# Patient Record
Sex: Female | Born: 1980 | Race: White | Hispanic: No | Marital: Married | Smoking: Current every day smoker
Health system: Southern US, Community
[De-identification: ages and names within clinical notes are randomized; demographics above are authoritative.]

## PROBLEM LIST (undated history)

## (undated) DIAGNOSIS — Z803 Family history of malignant neoplasm of breast: Secondary | ICD-10-CM

## (undated) DIAGNOSIS — Z9889 Other specified postprocedural states: Secondary | ICD-10-CM

## (undated) DIAGNOSIS — Z807 Family history of other malignant neoplasms of lymphoid, hematopoietic and related tissues: Secondary | ICD-10-CM

## (undated) DIAGNOSIS — R51 Headache: Secondary | ICD-10-CM

## (undated) DIAGNOSIS — J45909 Unspecified asthma, uncomplicated: Secondary | ICD-10-CM

## (undated) DIAGNOSIS — R7303 Prediabetes: Secondary | ICD-10-CM

## (undated) DIAGNOSIS — F419 Anxiety disorder, unspecified: Secondary | ICD-10-CM

## (undated) DIAGNOSIS — N879 Dysplasia of cervix uteri, unspecified: Secondary | ICD-10-CM

## (undated) DIAGNOSIS — E7800 Pure hypercholesterolemia, unspecified: Secondary | ICD-10-CM

## (undated) DIAGNOSIS — F988 Other specified behavioral and emotional disorders with onset usually occurring in childhood and adolescence: Secondary | ICD-10-CM

## (undated) DIAGNOSIS — R519 Headache, unspecified: Secondary | ICD-10-CM

## (undated) DIAGNOSIS — R112 Nausea with vomiting, unspecified: Secondary | ICD-10-CM

## (undated) DIAGNOSIS — Z808 Family history of malignant neoplasm of other organs or systems: Secondary | ICD-10-CM

## (undated) DIAGNOSIS — G43109 Migraine with aura, not intractable, without status migrainosus: Secondary | ICD-10-CM

## (undated) HISTORY — DX: Dysplasia of cervix uteri, unspecified: N87.9

## (undated) HISTORY — DX: Family history of malignant neoplasm of breast: Z80.3

## (undated) HISTORY — DX: Family history of other malignant neoplasms of lymphoid, hematopoietic and related tissues: Z80.7

## (undated) HISTORY — DX: Family history of malignant neoplasm of other organs or systems: Z80.8

## (undated) HISTORY — DX: Anxiety disorder, unspecified: F41.9

## (undated) HISTORY — PX: TONSILLECTOMY: SUR1361

## (undated) HISTORY — DX: Migraine with aura, not intractable, without status migrainosus: G43.109

## (undated) HISTORY — DX: Pure hypercholesterolemia, unspecified: E78.00

## (undated) HISTORY — PX: CERVICAL BIOPSY  W/ LOOP ELECTRODE EXCISION: SUR135

## (undated) HISTORY — PX: ENDOMETRIAL ABLATION: SHX621

## (undated) HISTORY — PX: TUBAL LIGATION: SHX77

---

## 2005-11-02 ENCOUNTER — Other Ambulatory Visit: Admission: RE | Admit: 2005-11-02 | Discharge: 2005-11-02 | Payer: Self-pay | Admitting: Obstetrics and Gynecology

## 2005-11-27 ENCOUNTER — Emergency Department (HOSPITAL_COMMUNITY): Admission: EM | Admit: 2005-11-27 | Discharge: 2005-11-27 | Payer: Self-pay | Admitting: Family Medicine

## 2011-09-15 ENCOUNTER — Ambulatory Visit: Payer: Self-pay | Admitting: Physical Medicine and Rehabilitation

## 2013-01-27 ENCOUNTER — Observation Stay: Payer: Self-pay | Admitting: Obstetrics and Gynecology

## 2013-02-23 ENCOUNTER — Ambulatory Visit: Payer: Self-pay | Admitting: Obstetrics and Gynecology

## 2013-03-20 ENCOUNTER — Ambulatory Visit: Payer: Self-pay | Admitting: Obstetrics and Gynecology

## 2013-04-17 ENCOUNTER — Ambulatory Visit: Payer: Self-pay | Admitting: Obstetrics and Gynecology

## 2013-04-17 LAB — CBC WITH DIFFERENTIAL/PLATELET
Basophil #: 0.1 10*3/uL (ref 0.0–0.1)
Basophil %: 0.5 %
Eosinophil %: 1.2 %
HGB: 12 g/dL (ref 12.0–16.0)
Lymphocyte %: 19.8 %
MCHC: 34.8 g/dL (ref 32.0–36.0)
MCV: 83 fL (ref 80–100)
Monocyte #: 0.5 x10 3/mm (ref 0.2–0.9)
Monocyte %: 4.5 %
Neutrophil #: 8.2 10*3/uL — ABNORMAL HIGH (ref 1.4–6.5)
RBC: 4.13 10*6/uL (ref 3.80–5.20)
RDW: 13.6 % (ref 11.5–14.5)
WBC: 11.1 10*3/uL — ABNORMAL HIGH (ref 3.6–11.0)

## 2013-04-20 ENCOUNTER — Inpatient Hospital Stay: Payer: Self-pay | Admitting: Obstetrics and Gynecology

## 2013-04-22 LAB — PATHOLOGY REPORT

## 2014-06-09 DIAGNOSIS — F988 Other specified behavioral and emotional disorders with onset usually occurring in childhood and adolescence: Secondary | ICD-10-CM | POA: Insufficient documentation

## 2014-06-09 DIAGNOSIS — Z8639 Personal history of other endocrine, nutritional and metabolic disease: Secondary | ICD-10-CM | POA: Insufficient documentation

## 2014-12-10 NOTE — Op Note (Signed)
PATIENT NAME:  Haley Mcdonald, Haley Mcdonald MR#:  [id number] DATE OF BIRTH:  October 08, 1980  DATE OF PROCEDURE:  04/20/2013  PREOPERATIVE DIAGNOSES: 1.  [redacted] weeks gestation.  2.  Breech presentation.  3.  Elective permanent sterilization.  POSTOPERATIVE DIAGNOSIS:  1.  [redacted] weeks gestation.  2.  Breech presentation.  3.  Intraperitoneal intrauterine device. 4.  Elective permanent sterilization.   PROCEDURES: 1.  Primary low transverse cesarean section. 2.  Bilateral tubal ligation, [place]. 3.  Dissection of intrauterine device from the posterior serosa of the uterine wall.  4.  On-Q pump placement.   SURGEON: Boykin Nearing, M.D.   FIRST ASSISTANT: Scrub Tech Brame  INDICATIONS: This is a 35 year old gravida 4, para 2 with EDC of 04/27/2013. Fetus was noted to be in breech presentation, which was reconfirmed the day of the procedure. The patient also had elected on permanent sterilization and she reconfirms those desires the day of surgery. The patient is aware of the failure rate for tubal ligation of 1 per 200 per year.   DESCRIPTION OF PROCEDURE: After adequate spinal anesthesia, the patient was placed in the dorsal supine position with a hip roll under the right side. The patient's abdomen was prepped and draped in normal sterile fashion. A Pfannenstiel incision was made 2 fingerbreadths above the symphysis pubis. Sharp dissection was used to identify the fascia. The fascia was opened in the midline and opened in a transverse fashion. The superior aspect of the fascia was grasped with Kocher clamps and the recti muscles dissected free. The inferior aspect of the fascia was grasped with Kocher clamps and the pyramidalis muscle was dissected free. Entry into the peritoneal cavity was accomplished sharply. The vesicouterine peritoneal fold was identified and opened, and the bladder was reflected inferiorly. A low transverse uterine incision was made. Upon entry into the endometrial cavity, clear  fluid resulted. Fetal buttocks was delivered through the incision followed by delivery of the legs without difficulty. Arms were delivered without difficulty, and the head was delivered as well without difficulty. A vigorous female's oral and nasopharynx suctioned and he was passed to nursery staff who assigned Apgar scores of 9 and 9. The infant female weighed 3900 grams. The placenta was manually delivered, and the uterus was exteriorized. The endometrial cavity was wiped clean with laparotomy tape, and the cervix was opened with a ring forceps. Uterine incision was closed with 1 chromic suture in a running locking fashion with good approximation of edges. Two additional figure-of-eight sutures were required for good hemostasis. Fallopian tubes and ovaries appeared normal. During suctioning of the posterior cul-de-sac, it was noted that a lost IUD was embedded into the posterior aspect of the uterine serosa. This was dissected free with the Bovie without difficulty and good hemostasis noted. The posterior cul-de-sac was irrigated and suctioned and attention was then directed to the patient's right fallopian tube which was grasped in the midportion with a Babcock clamp. Two separate 0 plain gut sutures were applied, and a 1.5 cm portion of fallopian tube was removed. Good hemostasis was noted. A similar procedure was repeated on the patient's left fallopian tube. After grasping the tube at the midportion, 2 separate 0 plain gut sutures were applied and a 1.5 cm portion of fallopian tube removed. Good hemostasis noted. The uterus was placed back into the intra-abdominal cavity. Paracolic gutters were then wiped clean with laparotomy tape, and the tubal ligation sites appeared hemostatic, and the uterine incision appeared hemostatic. Interceed was placed over the uterine  incision, in a T-shaped fashion. The superior aspect of the fascia was regrasped with Kocher clamps. On-Q pump needles were placed from an  infraumbilical position to a subfascial position without difficulty. The fascia was then closed over top the catheters with 0 Vicryl suture in a running nonlocking fashion. Subcutaneous tissues were irrigated and bovied for hemostasis, and the skin was reapproximated with Insorb staples with good cosmetic effect, good hemostasis was noted. The On-Q pump catheters were secured at the skin level with Dermabond and the catheters were secured at the skin level and Tegaderm placed over top of these. Each catheter was loaded with 5 mL of 0.5% Marcaine. Estimated blood loss 400 mL. Intraoperative fluids 1200 mL. The patient tolerated the procedure well. The patient did receive 2 grams IV Ancef prior to commencement of the case.  ____________________________ Boykin Nearing, MD tjs:sb D: 04/20/2013 10:48:08 ET T: 04/20/2013 13:05:57 ET JOB#: [id number]  cc: Boykin Nearing, MD, <Dictator> Boykin Nearing MD ELECTRONICALLY SIGNED 04/23/2013 9:36

## 2015-03-14 ENCOUNTER — Other Ambulatory Visit: Payer: Self-pay

## 2015-03-22 ENCOUNTER — Encounter
Admission: RE | Admit: 2015-03-22 | Discharge: 2015-03-22 | Disposition: A | Discharge status: Home / Self Care | Source: Ambulatory Visit | Attending: Obstetrics and Gynecology | Admitting: Obstetrics and Gynecology

## 2015-03-22 DIAGNOSIS — Z01812 Encounter for preprocedural laboratory examination: Secondary | ICD-10-CM | POA: Diagnosis not present

## 2015-03-22 DIAGNOSIS — N939 Abnormal uterine and vaginal bleeding, unspecified: Secondary | ICD-10-CM | POA: Insufficient documentation

## 2015-03-22 HISTORY — DX: Headache, unspecified: R51.9

## 2015-03-22 HISTORY — DX: Other specified behavioral and emotional disorders with onset usually occurring in childhood and adolescence: F98.8

## 2015-03-22 HISTORY — DX: Headache: R51

## 2015-03-22 HISTORY — DX: Unspecified asthma, uncomplicated: J45.909

## 2015-03-22 LAB — BASIC METABOLIC PANEL
ANION GAP: 7 (ref 5–15)
BUN: 13 mg/dL (ref 6–20)
CALCIUM: 9.2 mg/dL (ref 8.9–10.3)
CO2: 28 mmol/L (ref 22–32)
Chloride: 104 mmol/L (ref 101–111)
Creatinine, Ser: 0.63 mg/dL (ref 0.44–1.00)
GFR calc Af Amer: >60 mL/min (ref >60)
GFR calc non Af Amer: >60 mL/min (ref >60)
Glucose, Bld: 90 mg/dL (ref 65–99)
POTASSIUM: 3.9 mmol/L (ref 3.5–5.1)
Sodium: 139 mmol/L (ref 135–145)

## 2015-03-22 LAB — CBC
HCT: 44.1 % (ref 35.0–47.0)
Hemoglobin: 14.7 g/dL (ref 12.0–16.0)
MCH: 28.5 pg (ref 26.0–34.0)
MCHC: 33.2 g/dL (ref 32.0–36.0)
MCV: 85.7 fL (ref 80.0–100.0)
Platelets: 192 10*3/uL (ref 150–440)
RBC: 5.15 MIL/uL (ref 3.80–5.20)
RDW: 13.5 % (ref 11.5–14.5)
WBC: 9.4 10*3/uL (ref 3.6–11.0)

## 2015-03-22 LAB — TYPE AND SCREEN
ABO/RH(D): O NEG
Antibody Screen: NEGATIVE

## 2015-03-22 LAB — ABO/RH: ABO/RH(D): O NEG

## 2015-03-22 NOTE — Patient Instructions (Signed)
  Your procedure is scheduled GY:IRSWNI 12, 2016 (Friday) Report to Day Surgery. To find out your arrival time please call [phone] between 1PM - 3PM on March 31, 2015 (Thursday).  Remember: Instructions that are not followed completely may result in serious medical risk, up to and including death, or upon the discretion of your surgeon and anesthesiologist your surgery may need to be rescheduled.    __x__ 1. Do not eat food or drink liquids after midnight. No gum chewing or hard candies.     ____ 2. No Alcohol for 24 hours before or after surgery.   ____ 3. Bring all medications with you on the day of surgery if instructed.    __x__ 4. Notify your doctor if there is any change in your medical condition     (cold, fever, infections).     Do not wear jewelry, make-up, hairpins, clips or nail polish.  Do not wear lotions, powders, or perfumes. You may wear deodorant.  Do not shave 48 hours prior to surgery. Men may shave face and neck.  Do not bring valuables to the hospital.    [facility] is not responsible for any belongings or valuables.               Contacts, dentures or bridgework may not be worn into surgery.  Leave your suitcase in the car. After surgery it may be brought to your room.  For patients admitted to the hospital, discharge time is determined by your                treatment team.   Patients discharged the day of surgery will not be allowed to drive home.   Please read over the following fact sheets that you were given:      ____ Take these medicines the morning of surgery with A SIP OF WATER:    1.   2.   3.   4.  5.  6.  ____ Fleet Enema (as directed)   ____ Use CHG Soap as directed  ____ Use inhalers on the day of surgery  ____ Stop metformin 2 days prior to surgery    ____ Take 1/2 of usual insulin dose the night before surgery and none on the morning of surgery.   ____ Stop Coumadin/Plavix/aspirin on   ____ Stop Anti-inflammatories on     ____ Stop supplements until after surgery.    ____ Bring C-Pap to the hospital.

## 2015-03-22 NOTE — H&P (Signed)
Haley Mcdonald is an 34 y.o. female. [id number] With abnormal uterine bleeding who desires endometrial ablation. She is not a candidate for hormonal control because of risk factors.  The patient underwent a preliminary transvaginal pelvic sonogram. - Uterus: 8.4x6.3x5cm with ES of 6/28mm - Ovaries: L volume 16, R volume 8 - Other: Nothing else unusual  Prior Pap smear normal 2014 - Hx of LEEP in remote past Endometrial biopsy benign SIS: Sounds to 7cm total length - no fibroids or polyps Contraception: BTL    Past Medical History  Diagnosis Date  . Asthma     as a child  . Headache   . Cancer     cervical  . ADD (attention deficit disorder) without hyperactivity     Past Surgical History  Procedure Laterality Date  . Tonsillectomy    . Cesarean section      Family History  Problem Relation Age of Onset  . Breast cancer Mother   . Lung cancer Father     Social History:  reports that she has been smoking Cigarettes.  She has been smoking about 0.50 packs per day. She has never used smokeless tobacco. She reports that she does not drink alcohol or use illicit drugs.  Allergies: No Known Allergies  No prescriptions prior to admission    Review of Systems  Constitutional: Negative for fever and chills.  Eyes: Negative for blurred vision and double vision.  Respiratory: Negative for shortness of breath.   Cardiovascular: Negative for chest pain and palpitations.  Gastrointestinal: Negative for nausea, vomiting, abdominal pain, diarrhea and constipation.  Genitourinary: Negative for dysuria, urgency, frequency and flank pain.  Neurological: Negative for headaches.  Psychiatric/Behavioral: Negative for depression.    Physical Exam  Constitutional: She is oriented to person, place, and time. She appears well-developed and well-nourished. No distress.  Eyes: No scleral icterus.  Neck: Normal range of motion. Neck supple.  Cardiovascular: Normal rate.   Respiratory:  Effort normal. No respiratory distress.  GI: Soft. She exhibits no distension. There is no tenderness.  Genitourinary: Vagina normal and uterus normal.  Musculoskeletal: Normal range of motion.  Neurological: She is alert and oriented to person, place, and time.  Skin: Skin is warm and dry.  Psychiatric: She has a normal mood and affect.   Assessment/Plan:  Plan for [facility] hysteroscopy and Novasure ablation.  Originally planned for Novasure ablation in the office, but as her insurance will not cover office procedures, will consent for OR visit. Full options discussed- declines Depo, Mirena IUD with uterine perforation in the past, not a candidate for Combined therapy because of smoking hx. She is aware of possible failure before menopause and need for hysterectomy at that time or progesterone only methods. She also aware of small risk of tubal-ablation syndrome. She desires to proceed with above procedure.  [name] 03/22/2015, 8:37 AM

## 2015-03-24 ENCOUNTER — Other Ambulatory Visit: Payer: Self-pay

## 2015-04-01 ENCOUNTER — Ambulatory Visit: Admitting: Anesthesiology

## 2015-04-01 ENCOUNTER — Encounter: Payer: Self-pay | Admitting: *Deleted

## 2015-04-01 ENCOUNTER — Ambulatory Visit
Admission: RE | Admit: 2015-04-01 | Discharge: 2015-04-01 | Disposition: A | Discharge status: Home / Self Care | Source: Ambulatory Visit | Attending: Obstetrics and Gynecology | Admitting: Obstetrics and Gynecology

## 2015-04-01 ENCOUNTER — Encounter
Admission: RE | Disposition: A | Discharge status: Home / Self Care | Payer: Self-pay | Source: Ambulatory Visit | Attending: Obstetrics and Gynecology

## 2015-04-01 DIAGNOSIS — N920 Excessive and frequent menstruation with regular cycle: Secondary | ICD-10-CM | POA: Insufficient documentation

## 2015-04-01 DIAGNOSIS — F17210 Nicotine dependence, cigarettes, uncomplicated: Secondary | ICD-10-CM | POA: Diagnosis not present

## 2015-04-01 HISTORY — PX: DILITATION & CURRETTAGE/HYSTROSCOPY WITH NOVASURE ABLATION: SHX5568

## 2015-04-01 LAB — POCT PREGNANCY, URINE: PREG TEST UR: NEGATIVE

## 2015-04-01 LAB — TYPE AND SCREEN
ABO/RH(D): O NEG
Antibody Screen: NEGATIVE

## 2015-04-01 SURGERY — DILATATION & CURETTAGE/HYSTEROSCOPY WITH NOVASURE ABLATION
Anesthesia: Monitor Anesthesia Care | Site: Cervix | Wound class: Clean Contaminated

## 2015-04-01 MED ORDER — OXYCODONE HCL 5 MG/5ML PO SOLN: 5.0000 mg | Freq: Once | ORAL | Status: DC | PRN

## 2015-04-01 MED ORDER — IBUPROFEN 800 MG PO TABS
800.0000 mg | ORAL_TABLET | Freq: Three times a day (TID) | ORAL | Status: DC | PRN
Start: 2015-04-01 — End: 2018-06-02

## 2015-04-01 MED ORDER — BUPIVACAINE-EPINEPHRINE (PF) 0.5% -1:200000 IJ SOLN
INTRAMUSCULAR | Status: DC | PRN
  Administered 2015-04-01: 20 mL via PERINEURAL

## 2015-04-01 MED ORDER — SILVER NITRATE-POT NITRATE 75-25 % EX MISC
CUTANEOUS | Status: AC
  Filled 2015-04-01: qty 4

## 2015-04-01 MED ORDER — DEXAMETHASONE SODIUM PHOSPHATE 10 MG/ML IJ SOLN
INTRAMUSCULAR | Status: DC | PRN
  Administered 2015-04-01: 10 mg via INTRAVENOUS

## 2015-04-01 MED ORDER — LACTATED RINGERS IV SOLN
INTRAVENOUS | Status: DC
  Administered 2015-04-01 (×2): via INTRAVENOUS

## 2015-04-01 MED ORDER — FAMOTIDINE 20 MG PO TABS
ORAL_TABLET | ORAL | Status: AC
  Administered 2015-04-01: 20 mg via ORAL
  Filled 2015-04-01: qty 1

## 2015-04-01 MED ORDER — ONDANSETRON HCL 4 MG/2ML IJ SOLN
INTRAMUSCULAR | Status: DC | PRN
  Administered 2015-04-01: 4 mg via INTRAVENOUS

## 2015-04-01 MED ORDER — PROPOFOL 10 MG/ML IV BOLUS
INTRAVENOUS | Status: DC | PRN
  Administered 2015-04-01: 50 mg via INTRAVENOUS
  Administered 2015-04-01: 130 mg via INTRAVENOUS
  Administered 2015-04-01: 20 mg via INTRAVENOUS
  Administered 2015-04-01: 50 mg via INTRAVENOUS

## 2015-04-01 MED ORDER — FENTANYL CITRATE (PF) 100 MCG/2ML IJ SOLN: 25.0000 ug | INTRAMUSCULAR | Status: DC | PRN

## 2015-04-01 MED ORDER — SCOPOLAMINE 1 MG/3DAYS TD PT72
MEDICATED_PATCH | TRANSDERMAL | Status: AC
  Administered 2015-04-01: 1.5 mg via TRANSDERMAL
  Filled 2015-04-01: qty 1

## 2015-04-01 MED ORDER — FAMOTIDINE 20 MG PO TABS
20.0000 mg | ORAL_TABLET | Freq: Once | ORAL | Status: AC
  Administered 2015-04-01: 20 mg via ORAL

## 2015-04-01 MED ORDER — ACETAMINOPHEN 10 MG/ML IV SOLN
INTRAVENOUS | Status: AC
  Filled 2015-04-01: qty 100

## 2015-04-01 MED ORDER — OXYCODONE-ACETAMINOPHEN 5-325 MG PO TABS: 1.0000 | ORAL_TABLET | Freq: Four times a day (QID) | ORAL | Status: DC | PRN

## 2015-04-01 MED ORDER — KETOROLAC TROMETHAMINE 30 MG/ML IJ SOLN
INTRAMUSCULAR | Status: DC | PRN
Start: 2015-04-01 — End: 2015-04-01
  Administered 2015-04-01: 30 mg via INTRAVENOUS

## 2015-04-01 MED ORDER — SCOPOLAMINE 1 MG/3DAYS TD PT72
1.0000 | MEDICATED_PATCH | Freq: Once | TRANSDERMAL | Status: DC
  Administered 2015-04-01: 1.5 mg via TRANSDERMAL

## 2015-04-01 MED ORDER — OXYCODONE HCL 5 MG PO TABS: 5.0000 mg | ORAL_TABLET | Freq: Once | ORAL | Status: DC | PRN

## 2015-04-01 MED ORDER — MIDAZOLAM HCL 2 MG/2ML IJ SOLN
INTRAMUSCULAR | Status: DC | PRN
Start: 2015-04-01 — End: 2015-04-01
  Administered 2015-04-01: 2 mg via INTRAVENOUS

## 2015-04-01 MED ORDER — PROPOFOL INFUSION 10 MG/ML OPTIME
INTRAVENOUS | Status: DC | PRN
  Administered 2015-04-01: 150 ug/kg/min via INTRAVENOUS

## 2015-04-01 MED ORDER — BUPIVACAINE-EPINEPHRINE (PF) 0.5% -1:200000 IJ SOLN
INTRAMUSCULAR | Status: AC
  Filled 2015-04-01: qty 30

## 2015-04-01 MED ORDER — PROMETHAZINE HCL 25 MG/ML IJ SOLN
6.2500 mg | INTRAMUSCULAR | Status: DC | PRN
Start: 2015-04-01 — End: 2015-04-01

## 2015-04-01 MED ORDER — LACTATED RINGERS IV SOLN: INTRAVENOUS | Status: DC

## 2015-04-01 MED ORDER — ACETAMINOPHEN 10 MG/ML IV SOLN
INTRAVENOUS | Status: DC | PRN
  Administered 2015-04-01: 1000 mg via INTRAVENOUS

## 2015-04-01 SURGICAL SUPPLY — 16 items
CANISTER SUCT 1200ML W/VALVE (MISCELLANEOUS) ×2 IMPLANT
CATH ROBINSON RED A/P 16FR (CATHETERS) ×2 IMPLANT
GLOVE BIO SURGEON STRL SZ 6 (GLOVE) ×2 IMPLANT
GLOVE BIO SURGEON STRL SZ 6.5 (GLOVE) ×2 IMPLANT
GLOVE INDICATOR 6.5 STRL GRN (GLOVE) ×2 IMPLANT
GOWN STRL REUS W/ TWL LRG LVL3 (GOWN DISPOSABLE) ×2 IMPLANT
GOWN STRL REUS W/TWL LRG LVL3 (GOWN DISPOSABLE) ×2
IV LACTATED RINGERS 1000ML (IV SOLUTION) ×2 IMPLANT
KIT RM TURNOVER CYSTO AR (KITS) ×2 IMPLANT
NOVASURE ENDOMETRIAL ABLATION (MISCELLANEOUS) ×2 IMPLANT
NS IRRIG 500ML POUR BTL (IV SOLUTION) ×2 IMPLANT
PACK DNC HYST (MISCELLANEOUS) ×2 IMPLANT
PAD OB MATERNITY 4.3X12.25 (PERSONAL CARE ITEMS) ×2 IMPLANT
PAD PREP 24X41 OB/GYN DISP (PERSONAL CARE ITEMS) ×2 IMPLANT
TOWEL OR 17X26 4PK STRL BLUE (TOWEL DISPOSABLE) ×2 IMPLANT
TUBING CONNECTING 10 (TUBING) ×2 IMPLANT

## 2015-04-01 NOTE — Transfer of Care (Signed)
Immediate Anesthesia Transfer of Care Note  Patient: Haley Mcdonald  Procedure(s) Performed: Procedure(s): DILATATION & CURETTAGE/HYSTEROSCOPY WITH NOVASURE ABLATION (N/A)  Patient Location: PACU  Anesthesia Type:General  Level of Consciousness: awake  Airway & Oxygen Therapy: Patient Spontanous Breathing and Patient connected to face mask oxygen  Post-op Assessment: Report given to RN and Post -op Vital signs reviewed and stable  Post vital signs: Reviewed and stable  Last Vitals:  Filed Vitals:   04/01/15 0838  BP: 96/60  Pulse: 71  Temp: 36.6 C  Resp: 17    Complications: No apparent anesthesia complications

## 2015-04-01 NOTE — Progress Notes (Signed)
Pt awakened, oral airway removed intact 

## 2015-04-01 NOTE — Interval H&P Note (Signed)
History and Physical Interval Note:  04/01/2015 7:34 AM  Haley Mcdonald  has presented today for surgery, with the diagnosis of ABNORMAL UTERINE BLEEDING  The various methods of treatment have been discussed with the patient and family. After consideration of risks, benefits and other options for treatment, the patient has consented to  Procedure(s): [place] (N/A) as a surgical intervention .  The patient's history has been reviewed, patient examined, no change in status, stable for surgery.  I have reviewed the patient's chart and labs.  Questions were answered to the patient's satisfaction.     [name]

## 2015-04-01 NOTE — Anesthesia Preprocedure Evaluation (Signed)
Anesthesia Evaluation  Patient identified by MRN, date of birth, ID band Patient awake    Reviewed: Allergy & Precautions, H&P , NPO status , Patient's Chart, lab work & pertinent test results  History of Anesthesia Complications (+) PONV and history of anesthetic complications  Airway Mallampati: III  TM Distance: <3 FB Neck ROM: full    Dental  (+) Poor Dentition   Pulmonary neg shortness of breath, asthma , Current Smoker,  breath sounds clear to auscultation  Pulmonary exam normal       Cardiovascular Exercise Tolerance: Good - Past MI negative cardio ROS Normal cardiovascular examRhythm:regular Rate:Normal     Neuro/Psych  Headaches, PSYCHIATRIC DISORDERS    GI/Hepatic negative GI ROS, Neg liver ROS,   Endo/Other  negative endocrine ROS  Renal/GU negative Renal ROS  negative genitourinary   Musculoskeletal   Abdominal   Peds  Hematology negative hematology ROS (+)   Anesthesia Other Findings Past Medical History:   Asthma                                                         Comment:as a child   Headache                                                     Cancer                                                         Comment:cervical   ADD (attention deficit disorder) without hyper*              Reproductive/Obstetrics negative OB ROS                             Anesthesia Physical Anesthesia Plan  ASA: II  Anesthesia Plan: MAC   Post-op Pain Management:    Induction:   Airway Management Planned:   Additional Equipment:   Intra-op Plan:   Post-operative Plan:   Informed Consent: I have reviewed the patients History and Physical, chart, labs and discussed the procedure including the risks, benefits and alternatives for the proposed anesthesia with the patient or authorized representative who has indicated his/her understanding and acceptance.   Dental Advisory  Given  Plan Discussed with: Anesthesiologist, CRNA and Surgeon  Anesthesia Plan Comments: (MAC with backup GA with opioid avoidance )        Anesthesia Quick Evaluation

## 2015-04-01 NOTE — Progress Notes (Signed)
Pt to PACU from OR with oral airway intact.

## 2015-04-01 NOTE — Op Note (Signed)
Operative Report Hysteroscopy with Dilation and Curettage; Novasure ablation   Indications: Menorrhagia   Pre-operative Diagnosis:  1. Abnormal uterine bleeding 2. Failed medical management 3. Significant postop nausea history   Post-operative Diagnosis: same.  Procedure: 1. Exam under anesthesia 2. Fractional D&C with endocervical curettage  3. Hysteroscopy 4. Novasure endometrial ablation  Surgeon: Angelina Pih, MD  Assistant(s):  None  Anesthesia: Monitored Local Anesthesia with Sedation  Estimated Blood Loss:  Minimal         Intraoperative medications: Cervical block: 32ml of 0.5% Marcaine         Total IV Fluids: 1069ml  Urine Output: 20 ml         Specimens: Endocervical curettings, endometrial curettings         Complications:  None; patient tolerated the procedure well.         Disposition: PACU - hemodynamically stable.         Condition: stable  Findings: Uterus measuring 8.5 cm by sound; normal cervix, vagina, perineum. Cervical length: 4 cm Uterine cavity length: 4.5 cm Uterine cavity width: 4.4 cm Power in watts: 109 Total time: 1:3min  Indication for procedure/Consents: 34 y.o.  [id number] here for scheduled surgery for the aforementioned diagnoses. She has failed medical management: she is a smoker with elevated blood pressure and asthma, she experienced a uterine perforation with Mirena IUD, she declines Depo with a family history of weight gain. Risks of surgery were discussed with the patient including but not limited to: bleeding which may require transfusion; infection which may require antibiotics; injury to uterus or surrounding organs; intrauterine scarring which may impair future fertility; need for additional procedures including laparotomy or laparoscopy; and other postoperative/anesthesia complications. She is aware that she may need additional treatment for AUB prior to menopause. Written informed consent was obtained.    Procedure  Details:   The patient was taken to the operating room where iv anesthesia was administered and was found to be adequate. After a formal and adequate timeout was performed, she was placed in the dorsal lithotomy position and examined with the above findings. She was then prepped and draped in the sterile manner. Her bladder was catheterized for an estimated amount of dark amber urine. A weighed speculum was then placed in the patient's vagina and a single tooth tenaculum was applied to the anterior lip of the cervix.  A paracervical block was placed. An endocervical currettage was performed. Her cervix was serially dilated to 15 [place] using Hanks dilators.The hysteroscope was introduced to reveal the above findings.The hysteroscope was also used to determine the level of the internal os, and measurements were confirmed. The uterine cavity was carefully examined, both ostia were recognized, and diffusely proliferative endometrium with polypoid fragments was noted.  A sharp curettage was then performed until there was a gritty texture in all four quadrants.   NOVASURE PROCEDURE DETAILS:   The cervix was further dilated to accommodate the NovaSure device.  The NovaSure device was inserted, and the cavity width was determined. Using a power of 109 watts, for 1:55 sec, the endometrial ablation was performed. The hysteroscope was not re-introduced into the uterine cavity, to decrease the risk of pelvic infection. The tenaculum was removed from the anterior lip of the cervix, and the vaginal speculum was removed after noting good hemostasis.  She received iv acetaminophen and Toradol prior to leaving the OR. The patient tolerated the procedure well and was taken to the recovery area awake, extubated and in stable condition.  The patient will be discharged to home as per PACU criteria.  Routine postoperative instructions given.  She was prescribed Percocet, Ibuprofen and Colace.  She will follow up in the  clinic in two weeks for postoperative evaluation.

## 2015-04-01 NOTE — Discharge Instructions (Signed)
Discharge instructions after a hysteroscopy with dilation and curettage  Signs and Symptoms to Report  Call our office at [phone] if you have any of the following:   . Fever over 100.4 degrees or higher . Severe stomach pain not relieved with pain medications . Bright red bleeding that's heavier than a period that does not slow with rest after the first 24 hours . To go the bathroom a lot (frequency), you can't hold your urine (urgency), or it hurts when you empty your bladder (urinate) . Chest pain . Shortness of breath . Pain in the calves of your legs . Severe nausea and vomiting not relieved with anti-nausea medications . Any concerns  What You Can Expect after Surgery . You may see some pink tinged, bloody fluid. This is normal. You may also have cramping for several days.   Activities after Your Discharge Follow these guidelines to help speed your recovery at home: . Don't drive if you are in pain or taking narcotic pain medicine. You may drive when you can safely slam on the brakes, turn the wheel forcefully, and rotate your torso comfortably. This is typically 4-7 days. Practice in a parking lot or side street prior to attempting to drive regularly.  . Ask others to help with household chores for 4 weeks. . Don't do strenuous activities, exercises, or sports like vacuuming, tennis, squash, etc. until your doctor says it is safe to do so. . Walk as you feel able. Rest often since it may take a week or two for your energy level to return to normal.  . You may climb stairs . Avoid constipation:   -Eat fruits, vegetables, and whole grains. Eat small meals as your appetite will take time to return to normal.   -Drink 6 to 8 glasses of water each day unless your doctor has told you to limit your fluids.   -Use a laxative or stool softener as needed if constipation becomes a problem. You may take Miralax, metamucil, Citrucil, Colace, Senekot, FiberCon, etc. If this does not  relieve the constipation, try two tablespoons of Milk Of Magnesia every 8 hours until your bowels move.  . You may shower.  . Do not get in a hot tub, swimming pool, etc. until your doctor agrees. . Do not douche, use tampons, or have sex until your doctor says it is okay, usually about 2 weeks. . Take your pain medicine when you need it. The medicine may not work as well if the pain is bad.  Take the medicines you were taking before surgery. Other medications you might need are pain medications (ibuprofen), medications for constipation (Colace) and nausea medications (Zofran).        AMBULATORY SURGERY  DISCHARGE INSTRUCTIONS   1) The drugs that you were given will stay in your system until tomorrow so for the next 24 hours you should not:  A) Drive an automobile B) Make any legal decisions C) Drink any alcoholic beverage   2) You may resume regular meals tomorrow.  Today it is better to start with liquids and gradually work up to solid foods.  You may eat anything you prefer, but it is better to start with liquids, then soup and crackers, and gradually work up to solid foods.   3) Please notify your doctor immediately if you have any unusual bleeding, trouble breathing, redness and pain at the surgery site, drainage, fever, or pain not relieved by medication.    4) Additional Instructions:          Please contact your physician with any problems or Same Day Surgery at [phone], Monday through Friday 6 am to 4 pm, or [place] [facility] [place] Main number at [phone]. 

## 2015-04-01 NOTE — Anesthesia Postprocedure Evaluation (Signed)
  Anesthesia Post-op Note  Patient: Haley Mcdonald  Procedure(s) Performed: Procedure(s): DILATATION & CURETTAGE/HYSTEROSCOPY WITH NOVASURE ABLATION (N/A)  Anesthesia type:MAC  Patient location: PACU  Post pain: Pain level controlled  Post assessment: Post-op Vital signs reviewed, Patient's Cardiovascular Status Stable, Respiratory Function Stable, Patent Airway and No signs of Nausea or vomiting  Post vital signs: Reviewed and stable  Last Vitals:  Filed Vitals:   04/01/15 1005  BP: 106/58  Pulse:   Temp:   Resp: 16    Level of consciousness: awake, alert  and patient cooperative  Complications: No apparent anesthesia complications

## 2015-04-04 LAB — SURGICAL PATHOLOGY

## 2015-12-06 ENCOUNTER — Other Ambulatory Visit: Payer: Self-pay | Admitting: Obstetrics and Gynecology

## 2015-12-06 DIAGNOSIS — N644 Mastodynia: Secondary | ICD-10-CM

## 2015-12-07 ENCOUNTER — Ambulatory Visit
Admission: RE | Admit: 2015-12-07 | Discharge: 2015-12-07 | Disposition: A | Discharge status: Home / Self Care | Source: Ambulatory Visit | Attending: Obstetrics and Gynecology | Admitting: Obstetrics and Gynecology

## 2015-12-07 DIAGNOSIS — N644 Mastodynia: Secondary | ICD-10-CM | POA: Insufficient documentation

## 2015-12-07 DIAGNOSIS — N63 Unspecified lump in breast: Secondary | ICD-10-CM | POA: Insufficient documentation

## 2016-01-10 ENCOUNTER — Inpatient Hospital Stay: Admitting: Oncology

## 2017-11-29 ENCOUNTER — Other Ambulatory Visit: Payer: Self-pay | Admitting: Family Medicine

## 2017-11-29 MED ORDER — AMOXICILLIN 875 MG PO TABS: 875.0000 mg | ORAL_TABLET | Freq: Two times a day (BID) | ORAL | 0 refills | Status: DC

## 2017-12-16 ENCOUNTER — Ambulatory Visit (INDEPENDENT_AMBULATORY_CARE_PROVIDER_SITE_OTHER): Admitting: Family Medicine

## 2017-12-16 ENCOUNTER — Encounter: Payer: Self-pay | Admitting: Family Medicine

## 2017-12-16 ENCOUNTER — Other Ambulatory Visit: Payer: Self-pay | Admitting: Surgical

## 2017-12-16 VITALS — BP 112/82 | HR 64 | Temp 97.8°F

## 2017-12-16 DIAGNOSIS — F411 Generalized anxiety disorder: Secondary | ICD-10-CM

## 2017-12-16 DIAGNOSIS — F902 Attention-deficit hyperactivity disorder, combined type: Secondary | ICD-10-CM | POA: Diagnosis not present

## 2017-12-16 DIAGNOSIS — R5383 Other fatigue: Secondary | ICD-10-CM | POA: Diagnosis not present

## 2017-12-16 DIAGNOSIS — R5381 Other malaise: Secondary | ICD-10-CM | POA: Diagnosis not present

## 2017-12-16 DIAGNOSIS — E559 Vitamin D deficiency, unspecified: Secondary | ICD-10-CM | POA: Diagnosis not present

## 2017-12-16 MED ORDER — LEVOFLOXACIN 500 MG PO TABS: 500.0000 mg | ORAL_TABLET | Freq: Every day | ORAL | 0 refills | Status: DC

## 2017-12-16 MED ORDER — ESCITALOPRAM OXALATE 10 MG PO TABS: 10.0000 mg | ORAL_TABLET | Freq: Every day | ORAL | 1 refills | Status: DC

## 2017-12-16 NOTE — Progress Notes (Signed)
Haley Mcdonald is a 37 y.o. female is here TO ESTABLISH CARE.  History of Present Illness:   HPI: ADHD medications help but still very tired. Working around the clock. Lack of self-care. Racing thoughts at night. Enjoying time with family. Stressed that husband may be deployed.   Health Maintenance Due  Topic Date Due  . HIV Screening  04/16/1996  . TETANUS/TDAP  04/16/2000  . PAP SMEAR  04/16/2002   Depression screen PHQ 2/9 12/16/2017  Decreased Interest 0  Down, Depressed, Hopeless 0  PHQ - 2 Score 0   PMHx, SurgHx, SocialHx, FamHx, Medications, and Allergies were reviewed in the Visit Navigator and updated as appropriate.  There are no active problems to display for this patient.  Social History   Tobacco Use  . Smoking status: Current Every Day Smoker    Packs/day: 0.50    Types: Cigarettes  . Smokeless tobacco: Never Used  Substance Use Topics  . Alcohol use: No  . Drug use: No   Current Medications and Allergies:   .  amphetamine-dextroamphetamine (ADDERALL XR) 20 MG 24 hr capsule, Take 20 mg by mouth 3 (three) times daily., Disp: , Rfl:   Allergies  Allergen Reactions  . Penicillins Other (See Comments), Shortness Of Breath and Swelling    Tachycardia   . Bupropion     Other reaction(s): Unknown    Review of Systems   Pertinent items are noted in the HPI. Otherwise, ROS is negative.  Vitals:   Vitals:   12/16/17 1533  BP: 112/82  Pulse: 64  Temp: 97.8 F (36.6 C)  SpO2: 98%     There is no height or weight on file to calculate BMI.   Physical Exam:   Physical Exam  Constitutional: She is oriented to person, place, and time. She appears well-developed and well-nourished. No distress.  HENT:  Head: Normocephalic and atraumatic.  Right Ear: External ear normal.  Left Ear: External ear normal.  Nose: Nose normal.  Mouth/Throat: Oropharynx is clear and moist.  Eyes: Pupils are equal, round, and reactive to light. Conjunctivae and EOM are  normal.  Neck: Normal range of motion. Neck supple. No thyromegaly present.  Cardiovascular: Normal rate, regular rhythm, normal heart sounds and intact distal pulses.  Pulmonary/Chest: Effort normal and breath sounds normal.  Abdominal: Soft. Bowel sounds are normal.  Musculoskeletal: Normal range of motion.  Lymphadenopathy:    She has no cervical adenopathy.  Neurological: She is alert and oriented to person, place, and time.  Skin: Skin is warm and dry. Capillary refill takes less than 2 seconds.  Psychiatric: She has a normal mood and affect. Her behavior is normal.  Nursing note and vitals reviewed.   Assessment and Plan:   Diagnoses and all orders for this visit:  Attention deficit hyperactivity disorder (ADHD), combined type Comments: Continue current treatment for now. Okay to transition to Vyvanse in the future.   GAD (generalized anxiety disorder) Comments: Night awakening due to racing thoughts. Trial Lexapro. Orders: -     escitalopram (LEXAPRO) 10 MG tablet; Take 1 tablet (10 mg total) by mouth daily.  Malaise and fatigue -     CBC with Differential/Platelet -     Comprehensive metabolic panel -     TSH -     Vitamin B12 -     Ferritin  Vitamin D deficiency -     VITAMIN D 25 Hydroxy (Vit-D Deficiency, Fractures) -     Cholecalciferol 50000 units TABS; 50,000  units PO qwk for 12 weeks.   . Reviewed expectations re: course of current medical issues. . Discussed self-management of symptoms. . Outlined signs and symptoms indicating need for more acute intervention. . Patient verbalized understanding and all questions were answered. Marland Kitchen Health Maintenance issues including appropriate healthy diet, exercise, and smoking avoidance were discussed with patient. . See orders for this visit as documented in the electronic medical record. . Patient received an After Visit Summary.  Briscoe Deutscher, DO [place] [place] 12/17/2017  No future  appointments.

## 2017-12-17 ENCOUNTER — Encounter: Payer: Self-pay | Admitting: Family Medicine

## 2017-12-17 LAB — CBC WITH DIFFERENTIAL/PLATELET
Basophils Absolute: 0.1 10*3/uL (ref 0.0–0.1)
Basophils Relative: 0.9 % (ref 0.0–3.0)
Eosinophils Absolute: 0.3 10*3/uL (ref 0.0–0.7)
Eosinophils Relative: 2.6 % (ref 0.0–5.0)
HCT: 44.1 % (ref 36.0–46.0)
Hemoglobin: 14.7 g/dL (ref 12.0–15.0)
Lymphocytes Relative: 31.5 % (ref 12.0–46.0)
Lymphs Abs: 3.7 10*3/uL (ref 0.7–4.0)
MCHC: 33.3 g/dL (ref 30.0–36.0)
MCV: 87.5 fl (ref 78.0–100.0)
Monocytes Absolute: 0.7 10*3/uL (ref 0.1–1.0)
Monocytes Relative: 6.3 % (ref 3.0–12.0)
Neutro Abs: 6.9 10*3/uL (ref 1.4–7.7)
Neutrophils Relative %: 58.7 % (ref 43.0–77.0)
Platelets: 296 10*3/uL (ref 150.0–400.0)
RBC: 5.04 Mil/uL (ref 3.87–5.11)
RDW: 13.5 % (ref 11.5–15.5)
WBC: 11.8 10*3/uL — ABNORMAL HIGH (ref 4.0–10.5)

## 2017-12-17 LAB — COMPREHENSIVE METABOLIC PANEL
ALT: 15 U/L (ref 0–35)
AST: 10 U/L (ref 0–37)
Albumin: 4.6 g/dL (ref 3.5–5.2)
Alkaline Phosphatase: 114 U/L (ref 39–117)
BUN: 12 mg/dL (ref 6–23)
CO2: 28 mEq/L (ref 19–32)
Calcium: 9.7 mg/dL (ref 8.4–10.5)
Chloride: 101 mEq/L (ref 96–112)
Creatinine, Ser: 0.71 mg/dL (ref 0.40–1.20)
GFR: 98.63 mL/min (ref >60.00)
Glucose, Bld: 113 mg/dL — ABNORMAL HIGH (ref 70–99)
Potassium: 4.6 mEq/L (ref 3.5–5.1)
Sodium: 139 mEq/L (ref 135–145)
Total Bilirubin: 0.3 mg/dL (ref 0.2–1.2)
Total Protein: 7.1 g/dL (ref 6.0–8.3)

## 2017-12-17 LAB — TSH: TSH: 4.29 u[IU]/mL (ref 0.35–4.50)

## 2017-12-17 LAB — VITAMIN D 25 HYDROXY (VIT D DEFICIENCY, FRACTURES): VITD: 15.09 ng/mL — ABNORMAL LOW (ref 30.00–100.00)

## 2017-12-17 LAB — FERRITIN: Ferritin: 132.3 ng/mL (ref 10.0–291.0)

## 2017-12-17 LAB — VITAMIN B12: Vitamin B-12: 383 pg/mL (ref 211–911)

## 2017-12-17 MED ORDER — CHOLECALCIFEROL 1.25 MG (50000 UT) PO TABS: ORAL_TABLET | ORAL | 0 refills | Status: DC

## 2017-12-23 ENCOUNTER — Other Ambulatory Visit: Payer: Self-pay | Admitting: Surgical

## 2017-12-23 MED ORDER — AMPHETAMINE-DEXTROAMPHET ER 20 MG PO CP24
20.0000 mg | ORAL_CAPSULE | Freq: Two times a day (BID) | ORAL | 0 refills | Status: DC
Start: 2017-12-23 — End: 2018-02-27

## 2017-12-23 MED ORDER — AMPHETAMINE-DEXTROAMPHETAMINE 20 MG PO TABS: 20.0000 mg | ORAL_TABLET | Freq: Every day | ORAL | 0 refills | Status: DC

## 2017-12-23 NOTE — Progress Notes (Signed)
Please send these to the pharmacy 

## 2018-01-01 ENCOUNTER — Ambulatory Visit (INDEPENDENT_AMBULATORY_CARE_PROVIDER_SITE_OTHER): Admitting: Family Medicine

## 2018-01-01 DIAGNOSIS — L989 Disorder of the skin and subcutaneous tissue, unspecified: Secondary | ICD-10-CM

## 2018-01-15 ENCOUNTER — Ambulatory Visit (INDEPENDENT_AMBULATORY_CARE_PROVIDER_SITE_OTHER): Admitting: Family Medicine

## 2018-01-15 DIAGNOSIS — F902 Attention-deficit hyperactivity disorder, combined type: Secondary | ICD-10-CM | POA: Diagnosis not present

## 2018-01-15 DIAGNOSIS — F411 Generalized anxiety disorder: Secondary | ICD-10-CM | POA: Diagnosis not present

## 2018-01-15 DIAGNOSIS — F17210 Nicotine dependence, cigarettes, uncomplicated: Secondary | ICD-10-CM

## 2018-01-15 MED ORDER — LISDEXAMFETAMINE DIMESYLATE 40 MG PO CHEW: 1.0000 | CHEWABLE_TABLET | Freq: Every day | ORAL | 0 refills | Status: DC

## 2018-01-16 ENCOUNTER — Encounter: Payer: Self-pay | Admitting: Family Medicine

## 2018-01-16 NOTE — Progress Notes (Signed)
   Haley Mcdonald is a 37 y.o. female is here for follow up.  History of Present Illness:   HPI: Improved mood with Lexapro 5 mg daily. Ready to increase to 10 mg daily. She would like to change stimulant to Vyvanse. Having a difficult time remembering TID dosing. No side effects to medication otherwise.   Health Maintenance Due  Topic Date Due  . HIV Screening  04/16/1996  . TETANUS/TDAP  04/16/2000  . PAP SMEAR  04/16/2002   Depression screen PHQ 2/9 12/16/2017  Decreased Interest 0  Down, Depressed, Hopeless 0  PHQ - 2 Score 0   PMHx, SurgHx, SocialHx, FamHx, Medications, and Allergies were reviewed in the Visit Navigator and updated as appropriate.  There are no active problems to display for this patient.  Social History   Tobacco Use  . Smoking status: Current Every Day Smoker    Packs/day: 0.50    Types: Cigarettes  . Smokeless tobacco: Never Used  Substance Use Topics  . Alcohol use: No  . Drug use: No   Current Medications and Allergies:   Current Outpatient Medications:  .  amphetamine-dextroamphetamine (ADDERALL XR) 20 MG 24 hr capsule, Take 20 mg by mouth 3 (three) times daily., Disp: , Rfl:  .  amphetamine-dextroamphetamine (ADDERALL XR) 20 MG 24 hr capsule, Take 1 capsule (20 mg total) by mouth 2 (two) times daily., Disp: 30 capsule, Rfl: 0 .  amphetamine-dextroamphetamine (ADDERALL) 20 MG tablet, Take 1 tablet (20 mg total) by mouth daily., Disp: 30 tablet, Rfl: 0 .  Cholecalciferol 50000 units TABS, 50,000 units PO qwk for 12 weeks., Disp: 12 tablet, Rfl: 0 .  escitalopram (LEXAPRO) 10 MG tablet, Take 1 tablet (10 mg total) by mouth daily., Disp: 30 tablet, Rfl: 1  Allergies  Allergen Reactions  . Penicillins Other (See Comments), Shortness Of Breath and Swelling    Tachycardia   . Bupropion     Other reaction(s): Unknown   Review of Systems   Pertinent items are noted in the HPI. Otherwise, ROS is negative.  Vitals:  There were no vitals filed  for this visit.   There is no height or weight on file to calculate BMI.  Physical Exam:   Physical Exam  Constitutional: She appears well-developed and well-nourished. No distress.    Assessment and Plan:   Diagnoses and all orders for this visit:  Attention deficit hyperactivity disorder (ADHD), combined type -     Lisdexamfetamine Dimesylate (VYVANSE) 40 MG CHEW; Chew 1 tablet by mouth daily.  GAD (generalized anxiety disorder)  Cigarette nicotine dependence without complication Comments: Precontemplative. Quitline reviewed.    . Reviewed expectations re: course of current medical issues. . Discussed self-management of symptoms. . Outlined signs and symptoms indicating need for more acute intervention. . Patient verbalized understanding and all questions were answered. Marland Kitchen Health Maintenance issues including appropriate healthy diet, exercise, and smoking avoidance were discussed with patient. . See orders for this visit as documented in the electronic medical record. . Patient received an After Visit Summary.  Briscoe Deutscher, DO [place] [place] 01/16/2018  Future Appointments  Date Time Provider [place]  01/28/2018 11:00 AM Salvadore Dom, MD [place] None

## 2018-01-26 NOTE — Progress Notes (Signed)
Procedure Note:   Procedure:  Skin biopsy Indication:  Changing mole (s ),  Suspicious lesion(s)  Risks including unsuccessful procedure, bleeding, infection, bruising, scar, a need for another complete procedure and others were explained to the patient in detail as well as the benefits. Informed consent was obtained and signed.   The patient was placed in a decubitus position.  Lesion #1 on 3 measuring 3 mm  Skin over lesion #1  was prepped with Betadine and alcohol  and anesthetized with 1 cc of 2% lidocaine and epinephrine, using a 25-gauge 1 inch needle.  Shave biopsy with a sterile Dermablade was carried out in the usual fashion. Band-Aid was applied with antibiotic ointment.  Briscoe Deutscher DO

## 2018-01-28 ENCOUNTER — Encounter: Payer: Self-pay | Admitting: Obstetrics and Gynecology

## 2018-02-13 ENCOUNTER — Other Ambulatory Visit: Payer: Self-pay | Admitting: Family Medicine

## 2018-02-13 DIAGNOSIS — F411 Generalized anxiety disorder: Secondary | ICD-10-CM

## 2018-02-24 ENCOUNTER — Other Ambulatory Visit: Payer: Self-pay | Admitting: Family Medicine

## 2018-02-24 MED ORDER — LISDEXAMFETAMINE DIMESYLATE 50 MG PO CHEW: 1.0000 | CHEWABLE_TABLET | Freq: Every day | ORAL | 0 refills | Status: DC

## 2018-02-26 NOTE — Progress Notes (Signed)
37 y.o. G6P3. Married CaucasianF here for annual exam.   She has a h/o an endometrial ablation in 2015. The ablation worked well until 6 months ago. Over the last 6 months cycles have gotten worse. Cycles are monthly x 4 day, heavy x 1 day. Changing a pad every 2 hours (this is an increase). Having bad cramps, worsening with time. Spotting randomly in between her cycles, can go on for 1-2 weeks, can start and stop. She is getting headaches with every cycle. At least one migraine, the rest are tension headaches. Headache every day of her cycle. Gets auras with her migraines.   Sexually active, no pain. Some post coital spotting. Same partner x 10 years, married.   In the past she had a mirena IUD, was told it expulsed, but wasn't, was found at the time of C/S. She never wants another IUD.     Patient's last menstrual period was 01/29/2018.          Sexually active: Yes.    The current method of family planning is tubal ligation.    Exercising: No.  The patient does not participate in regular exercise at present. Smoker:  Yes, less than 1/2 PPD, working on quitting   Health Maintenance: Pap:  ~4 years ago at [facility], WNL per patient  History of abnormal Pap:  Yes, h/o LEEP in 2006-2008, all paps since then have been normal.  MMG:  12-07-15  Colonoscopy:  no BMD:   no TDaP:  UTD  Gardasil: completed 1/3, declines finishing    reports that she has been smoking cigarettes.  She has been smoking about 0.50 packs per day. She has never used smokeless tobacco. She reports that she drinks alcohol. She reports that she does not use drugs. Rare ETOH. Kids are 64, 15 and 4 (all boys). 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   Past Medical History:  Diagnosis Date  . ADD (attention deficit disorder) without hyperactivity   . Anxiety   . Asthma    as a child  . Cervical dysplasia   . Headache   . Migraine with aura     Past Surgical History:  Procedure Laterality Date  . CERVICAL BIOPSY  W/ LOOP  ELECTRODE EXCISION    . CESAREAN SECTION    . DILITATION & CURRETTAGE/HYSTROSCOPY WITH NOVASURE ABLATION N/A 04/01/2015   Procedure: DILATATION & CURETTAGE/HYSTEROSCOPY WITH NOVASURE ABLATION;  Surgeon: [name], MD;  Location: [facility] ORS;  Service: Gynecology;  Laterality: N/A;  . ENDOMETRIAL ABLATION    . TONSILLECTOMY    . TUBAL LIGATION    C/S x 1  Current Outpatient Medications  Medication Sig Dispense Refill  . Cholecalciferol 50000 units TABS 50,000 units PO qwk for 12 weeks. 12 tablet 0  . escitalopram (LEXAPRO) 10 MG tablet TAKE 1 TABLET(10 MG) BY MOUTH DAILY 90 tablet 0  . ibuprofen (ADVIL,MOTRIN) 800 MG tablet Take 1 tablet (800 mg total) by mouth every 8 (eight) hours as needed for moderate pain. 30 tablet 1  . Lisdexamfetamine Dimesylate (VYVANSE) 50 MG CHEW Chew 1 tablet by mouth daily. 30 tablet 0   No current facility-administered medications for this visit.     Family History  Problem Relation Age of Onset  . Breast cancer Mother        85  . Lung cancer Father   . Breast cancer Maternal Aunt 23  . Breast cancer Paternal Grandmother        postmeopausal  . Colon cancer Maternal Grandfather  MAunt died at 69 of breast cancer. Mother is well. Mother and patient with negative BRCA testing. She saw a Dietitian in 2003.   Review of Systems  Constitutional: Negative.   HENT: Negative.   Eyes: Negative.   Respiratory: Negative.   Cardiovascular: Negative.   Gastrointestinal: Negative.   Endocrine: Negative.   Genitourinary:       Irregular bleeding  Musculoskeletal: Negative.   Skin: Negative.   Allergic/Immunologic: Negative.   Neurological: Negative.   Hematological: Negative.   Psychiatric/Behavioral: Negative.     Exam:   BP 124/72 (BP Location: Right Arm, Patient Position: Sitting, Cuff Size: Normal)   Pulse 76   Resp 14   Ht 5' 3"  (1.6 m)   Wt 167 lb (75.8 kg)   LMP 01/29/2018   BMI 29.58 kg/m   Weight change: @WEIGHTCHANGE @  Height:   Height: 5' 3"  (160 cm)  Ht Readings from Last 3 Encounters:  02/27/18 5' 3"  (1.6 m)  04/01/15 5' 2"  (1.575 m)  03/22/15 5' 2"  (1.575 m)    General appearance: alert, cooperative and appears stated age Head: Normocephalic, without obvious abnormality, atraumatic Neck: no adenopathy, supple, symmetrical, trachea midline and thyroid normal to inspection and palpation Lungs: clear to auscultation bilaterally Cardiovascular: regular rate and rhythm Breasts: normal appearance, no masses or tenderness Abdomen: soft, non-tender; non distended,  no masses,  no organomegaly Extremities: extremities normal, atraumatic, no cyanosis or edema Skin: Skin color, texture, turgor normal. No rashes or lesions Lymph nodes: Cervical, supraclavicular, and axillary nodes normal. No abnormal inguinal nodes palpated Neurologic: Grossly normal   Pelvic: External genitalia:  no lesions              Urethra:  normal appearing urethra with no masses, tenderness or lesions              Bartholins and Skenes: normal                 Vagina: normal appearing vagina with normal color and discharge, no lesions              Cervix: no cervical motion tenderness and no lesions               Bimanual Exam:  Uterus:  anteverted, mobile, mildly tender, normal sized              Adnexa: no masses, mild bilateral tenderness               Rectovaginal: Confirms               Anus:  normal sphincter tone, no lesions  Chaperone was present for exam.  A:  Well Woman with normal exam  FH breast cancer, had genetic counseling years ago and negative BRCA testing  AUB, not anemic, normal TSH (high normal)   H/O endometrial ablation  Dysmenorrhea  P:   Pap with hpv   Ultrasound, possible sonohysterogram, possible endometrial biopsy  Not a candidate for OCP's, not likely candidate for mirena (declines anyway)  Labs with primary MD  3 D Mammogram now  Will refer back to genetics to calculate her risk of breast  cancer, possibly further testing. She may need MRI's  Discussed breast self exam  Discussed calcium and vit D intake

## 2018-02-27 ENCOUNTER — Other Ambulatory Visit (HOSPITAL_COMMUNITY)
Admission: RE | Admit: 2018-02-27 | Discharge: 2018-02-27 | Disposition: A | Discharge status: Home / Self Care | Source: Ambulatory Visit | Attending: Obstetrics and Gynecology | Admitting: Obstetrics and Gynecology

## 2018-02-27 ENCOUNTER — Other Ambulatory Visit: Payer: Self-pay

## 2018-02-27 ENCOUNTER — Encounter: Payer: Self-pay | Admitting: Obstetrics and Gynecology

## 2018-02-27 ENCOUNTER — Ambulatory Visit (INDEPENDENT_AMBULATORY_CARE_PROVIDER_SITE_OTHER): Admitting: Obstetrics and Gynecology

## 2018-02-27 VITALS — BP 124/72 | HR 76 | Resp 14 | Ht 63.0 in | Wt 167.0 lb

## 2018-02-27 DIAGNOSIS — N946 Dysmenorrhea, unspecified: Secondary | ICD-10-CM | POA: Diagnosis not present

## 2018-02-27 DIAGNOSIS — Z803 Family history of malignant neoplasm of breast: Secondary | ICD-10-CM

## 2018-02-27 DIAGNOSIS — Z01419 Encounter for gynecological examination (general) (routine) without abnormal findings: Secondary | ICD-10-CM | POA: Diagnosis not present

## 2018-02-27 DIAGNOSIS — Z124 Encounter for screening for malignant neoplasm of cervix: Secondary | ICD-10-CM | POA: Insufficient documentation

## 2018-02-27 DIAGNOSIS — N879 Dysplasia of cervix uteri, unspecified: Secondary | ICD-10-CM | POA: Insufficient documentation

## 2018-02-27 DIAGNOSIS — N939 Abnormal uterine and vaginal bleeding, unspecified: Secondary | ICD-10-CM | POA: Diagnosis not present

## 2018-02-27 DIAGNOSIS — Z9889 Other specified postprocedural states: Secondary | ICD-10-CM | POA: Diagnosis not present

## 2018-02-27 NOTE — Patient Instructions (Signed)
EXERCISE AND DIET:  We recommended that you start or continue a regular exercise program for good health. Regular exercise means any activity that makes your heart beat faster and makes you sweat.  We recommend exercising at least 30 minutes per day at least 3 days a week, preferably 4 or 5.  We also recommend a diet low in fat and sugar.  Inactivity, poor dietary choices and obesity can cause diabetes, heart attack, stroke, and kidney damage, among others.    ALCOHOL AND SMOKING:  Women should limit their alcohol intake to no more than 7 drinks/beers/glasses of wine (combined, not each!) per week. Moderation of alcohol intake to this level decreases your risk of breast cancer and liver damage. And of course, no recreational drugs are part of a healthy lifestyle.  And absolutely no smoking or even second hand smoke. Most people know smoking can cause heart and lung diseases, but did you know it also contributes to weakening of your bones? Aging of your skin?  Yellowing of your teeth and nails?  CALCIUM AND VITAMIN D:  Adequate intake of calcium and Vitamin D are recommended.  The recommendations for exact amounts of these supplements seem to change often, but generally speaking 600 mg of calcium (either carbonate or citrate) and 800 units of Vitamin D per day seems prudent. Certain women may benefit from higher intake of Vitamin D.  If you are among these women, your doctor will have told you during your visit.    PAP SMEARS:  Pap smears, to check for cervical cancer or precancers,  have traditionally been done yearly, although recent scientific advances have shown that most women can have pap smears less often.  However, every woman still should have a physical exam from her gynecologist every year. It will include a breast check, inspection of the vulva and vagina to check for abnormal growths or skin changes, a visual exam of the cervix, and then an exam to evaluate the size and shape of the uterus and  ovaries.  And after 37 years of age, a rectal exam is indicated to check for rectal cancers. We will also provide age appropriate advice regarding health maintenance, like when you should have certain vaccines, screening for sexually transmitted diseases, bone density testing, colonoscopy, mammograms, etc.   MAMMOGRAMS:  All women over 40 years old should have a yearly mammogram. Many facilities now offer a "3D" mammogram, which may cost around $50 extra out of pocket. If possible,  we recommend you accept the option to have the 3D mammogram performed.  It both reduces the number of women who will be called back for extra views which then turn out to be normal, and it is better than the routine mammogram at detecting truly abnormal areas.    COLONOSCOPY:  Colonoscopy to screen for colon cancer is recommended for all women at age 50.  We know, you hate the idea of the prep.  We agree, BUT, having colon cancer and not knowing it is worse!!  Colon cancer so often starts as a polyp that can be seen and removed at colonscopy, which can quite literally save your life!  And if your first colonoscopy is normal and you have no family history of colon cancer, most women don't have to have it again for 10 years.  Once every ten years, you can do something that may end up saving your life, right?  We will be happy to help you get it scheduled when you are ready.    Be sure to check your insurance coverage so you understand how much it will cost.  It may be covered as a preventative service at no cost, but you should check your particular policy.      Breast Self-Awareness Breast self-awareness means being familiar with how your breasts look and feel. It involves checking your breasts regularly and reporting any changes to your health care provider. Practicing breast self-awareness is important. A change in your breasts can be a sign of a serious medical problem. Being familiar with how your breasts look and feel allows  you to find any problems early, when treatment is more likely to be successful. All women should practice breast self-awareness, including women who have had breast implants. How to do a breast self-exam One way to learn what is normal for your breasts and whether your breasts are changing is to do a breast self-exam. To do a breast self-exam: Look for Changes  1. Remove all the clothing above your waist. 2. Stand in front of a mirror in a room with good lighting. 3. Put your hands on your hips. 4. Push your hands firmly downward. 5. Compare your breasts in the mirror. Look for differences between them (asymmetry), such as: ? Differences in shape. ? Differences in size. ? Puckers, dips, and bumps in one breast and not the other. 6. Look at each breast for changes in your skin, such as: ? Redness. ? Scaly areas. 7. Look for changes in your nipples, such as: ? Discharge. ? Bleeding. ? Dimpling. ? Redness. ? A change in position. Feel for Changes  Carefully feel your breasts for lumps and changes. It is best to do this while lying on your back on the floor and again while sitting or standing in the shower or tub with soapy water on your skin. Feel each breast in the following way:  Place the arm on the side of the breast you are examining above your head.  Feel your breast with the other hand.  Start in the nipple area and make  inch (2 cm) overlapping circles to feel your breast. Use the pads of your three middle fingers to do this. Apply light pressure, then medium pressure, then firm pressure. The light pressure will allow you to feel the tissue closest to the skin. The medium pressure will allow you to feel the tissue that is a little deeper. The firm pressure will allow you to feel the tissue close to the ribs.  Continue the overlapping circles, moving downward over the breast until you feel your ribs below your breast.  Move one finger-width toward the center of the body.  Continue to use the  inch (2 cm) overlapping circles to feel your breast as you move slowly up toward your collarbone.  Continue the up and down exam using all three pressures until you reach your armpit.  Write Down What You Find  Write down what is normal for each breast and any changes that you find. Keep a written record with breast changes or normal findings for each breast. By writing this information down, you do not need to depend only on memory for size, tenderness, or location. Write down where you are in your menstrual cycle, if you are still menstruating. If you are having trouble noticing differences in your breasts, do not get discouraged. With time you will become more familiar with the variations in your breasts and more comfortable with the exam. How often should I examine my breasts? Examine   your breasts every month. If you are breastfeeding, the best time to examine your breasts is after a feeding or after using a breast pump. If you menstruate, the best time to examine your breasts is 5-7 days after your period is over. During your period, your breasts are lumpier, and it may be more difficult to notice changes. When should I see my health care provider? See your health care provider if you notice:  A change in shape or size of your breasts or nipples.  A change in the skin of your breast or nipples, such as a reddened or scaly area.  Unusual discharge from your nipples.  A lump or thick area that was not there before.  Pain in your breasts.  Anything that concerns you.  This information is not intended to replace advice given to you by your health care provider. Make sure you discuss any questions you have with your health care provider. Document Released: 08/06/2005 Document Revised: 01/12/2016 Document Reviewed: 06/26/2015 Elsevier Interactive Patient Education  2018 [employer]  

## 2018-02-28 ENCOUNTER — Telehealth: Payer: Self-pay | Admitting: Genetic Counselor

## 2018-02-28 ENCOUNTER — Encounter: Payer: Self-pay | Admitting: Genetic Counselor

## 2018-02-28 LAB — CYTOLOGY - PAP
Diagnosis: NEGATIVE
HPV (WINDOPATH): NOT DETECTED

## 2018-02-28 NOTE — Telephone Encounter (Signed)
A genetic counseling referral was received from Dr. Talbert Nan for family hx of breast cancer. Pt has been scheduled to see Haley Mcdonald on 7/24 at 8am. Pt aware to arrive 30 minutes early. Letter mailed.

## 2018-03-03 ENCOUNTER — Encounter: Payer: Self-pay | Admitting: Licensed Clinical Social Worker

## 2018-03-03 ENCOUNTER — Telehealth: Payer: Self-pay | Admitting: Licensed Clinical Social Worker

## 2018-03-03 NOTE — Telephone Encounter (Signed)
Pt's genetic counseling appt has been rescheduled to [facility] on 7/25 at 1pm. Pt has agreed to the appt date and time. Letter mailed.

## 2018-03-04 ENCOUNTER — Telehealth: Payer: Self-pay | Admitting: Obstetrics and Gynecology

## 2018-03-04 NOTE — Telephone Encounter (Signed)
Patient returned call. Spoke with patient regarding benefit for a sonohystrogram and endometrial biopsy. Patient understood and agreeable. Patient ready to schedule. Patient scheduled 03/11/18 with Dr Talbert Nan. Patient aware of appointment date, arrival time and cancellation policy.  No further questions.   Routing to Dr Talbert Nan for final review. Patient is agreeable to disposition. Will close encounter

## 2018-03-04 NOTE — Telephone Encounter (Signed)
Call placed to patient to review benefits for recommended ultrasound. Left a voicemail message requesting a return call

## 2018-03-11 ENCOUNTER — Ambulatory Visit (INDEPENDENT_AMBULATORY_CARE_PROVIDER_SITE_OTHER): Admitting: Obstetrics and Gynecology

## 2018-03-11 ENCOUNTER — Encounter: Payer: Self-pay | Admitting: Obstetrics and Gynecology

## 2018-03-11 ENCOUNTER — Other Ambulatory Visit: Payer: Self-pay | Admitting: Obstetrics and Gynecology

## 2018-03-11 ENCOUNTER — Other Ambulatory Visit: Payer: Self-pay

## 2018-03-11 ENCOUNTER — Other Ambulatory Visit

## 2018-03-11 ENCOUNTER — Ambulatory Visit (INDEPENDENT_AMBULATORY_CARE_PROVIDER_SITE_OTHER)

## 2018-03-11 VITALS — BP 125/88 | HR 68 | Wt 167.0 lb

## 2018-03-11 DIAGNOSIS — N939 Abnormal uterine and vaginal bleeding, unspecified: Secondary | ICD-10-CM

## 2018-03-11 DIAGNOSIS — Z9889 Other specified postprocedural states: Secondary | ICD-10-CM

## 2018-03-11 DIAGNOSIS — N946 Dysmenorrhea, unspecified: Secondary | ICD-10-CM

## 2018-03-11 NOTE — Patient Instructions (Signed)

## 2018-03-11 NOTE — Progress Notes (Signed)
GYNECOLOGY  VISIT   HPI: 37 y.o.   Married  Caucasian  female   [phone] with No LMP recorded. Patient has had an ablation.   here for f/u of menometrorrhagia and worsening dysmenorrhea, prior h/o endometrial ablation. Normal CBC, high normal TSH  GYNECOLOGIC HISTORY: No LMP recorded. Patient has had an ablation. Contraception:Tubal ligation Menopausal hormone therapy: None        OB History    Gravida  6   Para  3   Term  2   Preterm  1   AB  3   Living  3     SAB  3   TAB      Ectopic      Multiple      Live Births  3              Patient Active Problem List   Diagnosis Date Noted  . Cervical dysplasia     Past Medical History:  Diagnosis Date  . ADD (attention deficit disorder) without hyperactivity   . Anxiety   . Asthma    as a child  . Cervical dysplasia   . Headache   . Migraine with aura     Past Surgical History:  Procedure Laterality Date  . CERVICAL BIOPSY  W/ LOOP ELECTRODE EXCISION    . CESAREAN SECTION    . DILITATION & CURRETTAGE/HYSTROSCOPY WITH NOVASURE ABLATION N/A 04/01/2015   Procedure: DILATATION & CURETTAGE/HYSTEROSCOPY WITH NOVASURE ABLATION;  Surgeon: [name], MD;  Location: [facility] ORS;  Service: Gynecology;  Laterality: N/A;  . ENDOMETRIAL ABLATION    . TONSILLECTOMY    . TUBAL LIGATION      Current Outpatient Medications  Medication Sig Dispense Refill  . Cholecalciferol 50000 units TABS 50,000 units PO qwk for 12 weeks. 12 tablet 0  . escitalopram (LEXAPRO) 10 MG tablet TAKE 1 TABLET(10 MG) BY MOUTH DAILY 90 tablet 0  . ibuprofen (ADVIL,MOTRIN) 800 MG tablet Take 1 tablet (800 mg total) by mouth every 8 (eight) hours as needed for moderate pain. 30 tablet 1  . Lisdexamfetamine Dimesylate (VYVANSE) 50 MG CHEW Chew 1 tablet by mouth daily. 30 tablet 0   No current facility-administered medications for this visit.      ALLERGIES: Penicillins and Bupropion  Family History  Problem Relation Age of Onset  .  Breast cancer Mother        38  . Lung cancer Father   . Breast cancer Maternal Aunt 77  . Breast cancer Paternal Grandmother        postmeopausal  . Colon cancer Maternal Grandfather     Social History   Socioeconomic History  . Marital status: Married    Spouse name: Not on file  . Number of children: Not on file  . Years of education: Not on file  . Highest education level: Not on file  Occupational History  . Not on file  Social Needs  . Financial resource strain: Not on file  . Food insecurity:    Worry: Not on file    Inability: Not on file  . Transportation needs:    Medical: Not on file    Non-medical: Not on file  Tobacco Use  . Smoking status: Current Every Day Smoker    Packs/day: 0.50    Types: Cigarettes  . Smokeless tobacco: Never Used  Substance and Sexual Activity  . Alcohol use: Yes    Comment: occasionally   . Drug use: No  . Sexual  activity: Yes    Birth control/protection: Surgical    Comment: BTL   Lifestyle  . Physical activity:    Days per week: Not on file    Minutes per session: Not on file  . Stress: Not on file  Relationships  . Social connections:    Talks on phone: Not on file    Gets together: Not on file    Attends religious service: Not on file    Active member of club or organization: Not on file    Attends meetings of clubs or organizations: Not on file    Relationship status: Married  . Intimate partner violence:    Fear of current or ex partner: Not on file    Emotionally abused: Not on file    Physically abused: Not on file    Forced sexual activity: Not on file  Other Topics Concern  . Not on file  Social History Narrative  . Not on file    Review of Systems  Constitutional: Negative.   HENT: Negative.   Eyes: Negative.   Respiratory: Negative.   Cardiovascular: Negative.   Gastrointestinal: Negative.   Genitourinary: Negative.   Musculoskeletal: Negative.   Skin: Negative.   Neurological: Negative.    Endo/Heme/Allergies: Negative.   Psychiatric/Behavioral: Negative.     PHYSICAL EXAMINATION:    BP 125/88 (BP Location: Right Arm, Patient Position: Sitting)   Pulse 68   Wt 167 lb (75.8 kg)   BMI 29.58 kg/m     General appearance: alert, cooperative and appears stated age  Pelvic: External genitalia:  no lesions              Urethra:  normal appearing urethra with no masses, tenderness or lesions              Bartholins and Skenes: normal                 Vagina: normal appearing vagina with normal color and discharge, no lesions              Cervix: no lesions  Sonohysterogram The procedure and risks of the procedure were reviewed with the patient, consent form was signed. A speculum was placed in the vagina and the cervix was cleansed with betadine. A tenaculum was placed at 12 o'clock. The uterine evacuator catheter was attached to the 60 cc syringe. The  catheter was inserted into the lower portion of the uterine cavity under direct visualization of the abdominal ultrasound. The tenaculum and speculum were removed. The transvaginal probe was placed vaginally and saline was infused under direct observation with the ultrasound.Only the lower portion of the cavity was distended with the fluid, the c/s scar filled with fluid and slightly above that. Question of a small polyp in the LUS. The upper cavity was scarred. The catheter was used to remove the fluid. The uterine aspirator syringe was attached to the catheter and the endometrial biopsy was performed, moderate tissue/blood was removed. The catheter was removed.   Chaperone was present for exam.  ASSESSMENT AUB, scarring and possible endometrial polyp on sonohysterogram Severe dysmenorrhea H/O endometrial ablation    PLAN Not a candidate for OCP's or mirnea IUD Discussed the option of hysteroscopy to better evaluate her cavity, she declines The patient desires definitive surgery Endometrial biopsy done Discussed total  laparoscopic hysterectomy, bilateral salpingectomes and cystoscopy. Reviewed the risks of the procedure, including infection, bleeding, damage to bowel/badder/vessels/ureters.  Discussed the possible need for laparotomy.All of her questions were  answered     An After Visit Summary was printed and given to the patient.  ~15 minutes face to face time of which over 50% was spent in counseling.

## 2018-03-12 ENCOUNTER — Encounter: Admitting: Genetic Counselor

## 2018-03-12 ENCOUNTER — Other Ambulatory Visit

## 2018-03-13 ENCOUNTER — Inpatient Hospital Stay: Attending: Genetic Counselor | Admitting: Licensed Clinical Social Worker

## 2018-03-13 ENCOUNTER — Inpatient Hospital Stay

## 2018-03-13 ENCOUNTER — Encounter: Payer: Self-pay | Admitting: Obstetrics and Gynecology

## 2018-03-13 ENCOUNTER — Encounter: Payer: Self-pay | Admitting: Licensed Clinical Social Worker

## 2018-03-13 DIAGNOSIS — Z315 Encounter for genetic counseling: Secondary | ICD-10-CM

## 2018-03-13 DIAGNOSIS — Z801 Family history of malignant neoplasm of trachea, bronchus and lung: Secondary | ICD-10-CM

## 2018-03-13 DIAGNOSIS — Z808 Family history of malignant neoplasm of other organs or systems: Secondary | ICD-10-CM | POA: Diagnosis not present

## 2018-03-13 DIAGNOSIS — Z800 Family history of malignant neoplasm of digestive organs: Secondary | ICD-10-CM

## 2018-03-13 DIAGNOSIS — Z807 Family history of other malignant neoplasms of lymphoid, hematopoietic and related tissues: Secondary | ICD-10-CM | POA: Diagnosis not present

## 2018-03-13 DIAGNOSIS — Z803 Family history of malignant neoplasm of breast: Secondary | ICD-10-CM | POA: Diagnosis not present

## 2018-03-13 NOTE — Progress Notes (Signed)
REFERRING PROVIDER: Salvadore Dom, MD [address] [place], [place] [address]   PRIMARY PROVIDER:  Briscoe Deutscher, DO  PRIMARY REASON FOR VISIT:  1. Family history of breast cancer   2. Family history of melanoma   3. Family history of lymphoma      HISTORY OF PRESENT ILLNESS:   Haley Mcdonald, a 37 y.o. female, was seen for a [place] cancer genetics consultation at the request of Haley Mcdonald due to a family history of cancer.  Haley Mcdonald presents to clinic today to discuss the possibility of a hereditary predisposition to cancer, genetic testing, and to further clarify her future cancer risks, as well as potential cancer risks for family members.   Haley Mcdonald is a 37 y.o. female with no personal history of cancer.    CANCER HISTORY:   No history exists.     HORMONAL RISK FACTORS:  Menarche was at age 7.  First live birth at age 35.  OCP use for approximately 10 years.  Ovaries intact: yes.  Hysterectomy: no.  Menopausal status: premenopausal.  HRT use: 0 years. Colonoscopy: no; not examined. Mammogram within the last year: yes. Number of breast biopsies: 0.  Past Medical History:  Diagnosis Date  . ADD (attention deficit disorder) without hyperactivity   . Anxiety   . Asthma    as a child  . Cervical dysplasia   . Family history of breast cancer   . Family history of lymphoma   . Family history of melanoma   . Headache   . Migraine with aura     Past Surgical History:  Procedure Laterality Date  . CERVICAL BIOPSY  W/ LOOP ELECTRODE EXCISION    . CESAREAN SECTION    . DILITATION & CURRETTAGE/HYSTROSCOPY WITH NOVASURE ABLATION N/A 04/01/2015   Procedure: DILATATION & CURETTAGE/HYSTEROSCOPY WITH NOVASURE ABLATION;  Surgeon: Haley Kindler, MD;  Location: [facility] ORS;  Service: Gynecology;  Laterality: N/A;  . ENDOMETRIAL ABLATION    . TONSILLECTOMY    . TUBAL LIGATION      Social History   Socioeconomic History  . Marital status:  Married    Spouse name: Not on file  . Number of children: Not on file  . Years of education: Not on file  . Highest education level: Not on file  Occupational History  . Not on file  Social Needs  . Financial resource strain: Not on file  . Food insecurity:    Worry: Not on file    Inability: Not on file  . Transportation needs:    Medical: Not on file    Non-medical: Not on file  Tobacco Use  . Smoking status: Current Every Day Smoker    Packs/day: 0.50    Types: Cigarettes  . Smokeless tobacco: Never Used  Substance and Sexual Activity  . Alcohol use: Yes    Comment: occasionally   . Drug use: No  . Sexual activity: Yes    Birth control/protection: Surgical    Comment: BTL   Lifestyle  . Physical activity:    Days per week: Not on file    Minutes per session: Not on file  . Stress: Not on file  Relationships  . Social connections:    Talks on phone: Not on file    Gets together: Not on file    Attends religious service: Not on file    Active member of club or organization: Not on file    Attends meetings of clubs or  organizations: Not on file    Relationship status: Married  Other Topics Concern  . Not on file  Social History Narrative  . Not on file     FAMILY HISTORY:  We obtained a detailed, 4-generation family history.  Significant diagnoses are listed below: Family History  Problem Relation Age of Onset  . Breast cancer Mother 37       "3x"  . Lung cancer Father   . Breast cancer Maternal Aunt 73  . Lymphoma Paternal Grandmother 105  . Colon cancer Maternal Grandfather 95  . Melanoma Paternal Grandfather 56  . Melanoma Cousin    Haley Mcdonald has three sons, no cancer history. She has one full sister, Haley Mcdonald, who has a son. She has two half-sisters and two-half brothers through her dad with no cancer history and another half-sister through her dad that died at 50.    Haley Mcdonald's father had lung cancer at 58 and died at 73. He had 2 brothers and 3  sisters. One of his sisters had sarcoma at 44 and died at 47. Haley Mcdonald has 10 paternal cousins, one has a history of melanoma. Haley Mcdonald's paternal grandmother had lymphoma at 57 and died at 32. Her paternal grandfather had melanoma at 30 and died at 67.   Haley Mcdonald's mother, Haley Mcdonald, has a history of breast cancer diagnosed at 65. It sounded to me that she was describing DCIS but she wasn't sure. Haley Mcdonald reported that they found cancer three times. Haley Mcdonald also reported that her mom had negative BRCA testing 15 years ago. Haley Mcdonald's mother has two brothers who are living, no cancer history. She had a sister who was diagnosed with breast cancer at 21 and died at 37. Haley Mcdonald has 9 maternal cousins with no cancer history. Haley Mcdonald's maternal grandmother died at 2 and her paternal grandfather had colon cancer at 27 and died at 26.   Haley Mcdonald is aware of previous family history of genetic testing for hereditary cancer risks. She reports both she and her mother were negative for BRCA1/2 15 years ago. Patient's maternal ancestors are of Caucasian descent, and paternal ancestors are of Caucasian descent. There is no reported Ashkenazi Jewish ancestry. There is no known consanguinity.  GENETIC COUNSELING ASSESSMENT: Haley Mcdonald is a 37 y.o. female with a family history which is somewhat suggestive of a Hereditary Cancer Predisposition Syndrome. We, therefore, discussed and recommended the following at today's visit.   DISCUSSION: We reviewed the characteristics, features and inheritance patterns of hereditary cancer syndromes. We also discussed genetic testing, including the appropriate family members to test, the process of testing, insurance coverage and turn-around-time for results. We discussed the implications of a negative, positive and/or variant of uncertain significant result. We recommended Haley Mcdonald pursue genetic testing for the [facility] Common Hereditary  Cancers + melanoma gene panel.   The Common Hereditary Cancer Panel offered by [facility] includes sequencing and/or deletion duplication testing of the following 47 genes: APC, ATM, AXIN2, BARD1, BMPR1A, BRCA1, BRCA2, BRIP1, CDH1, CDKN2A (p14ARF), CDKN2A (p16INK4a), CKD4, CHEK2, CTNNA1, DICER1, EPCAM (Deletion/duplication testing only), GREM1 (promoter region deletion/duplication testing only), KIT, MEN1, MLH1, MSH2, MSH3, MSH6, MUTYH, NBN, NF1, NHTL1, PALB2, PDGFRA, PMS2, POLD1, POLE, PTEN, RAD50, RAD51C, RAD51D, SDHB, SDHC, SDHD, SMAD4, SMARCA4. STK11, TP53, TSC1, TSC2, and VHL.  The following genes were evaluated for sequence changes only: SDHA and HOXB13 c.251G>A variant only.  The [facility] Melanoma Panel analyzed the following 9 genes: BAP1 BRCA2 CDK4 CDKN2A MITF  POT1 PTEN RB1 Tp53, Preliminary-evidence Genes for Melanoma: BRCA1 MC1R TERT.  We discussed that only 5-10% of cancers are associated with a Hereditary cancer predisposition syndrome.  One of the most common hereditary cancer syndromes that increases breast cancer risk is called Hereditary Breast and Ovarian Cancer (HBOC) syndrome.  This syndrome is caused by mutations in the BRCA1 and BRCA2 genes.  This syndrome increases an individual's lifetime risk to develop breast, ovarian, pancreatic, and other types of cancer.  There are also many other cancer predisposition syndromes caused by mutations in several other genes.  We discussed that if she is found to have a mutation in one of these genes, it may impact future medical management recommendations such as increased cancer screenings and consideration of risk reducing surgeries.  A positive result could also have implications for the patient's family members.  A Negative result would mean we were unable to identify a hereditary component to her cancer, but does not rule out the possibility of a hereditary basis for her family's cancer.  There could be mutations that are undetectable by current  technology, or in genes not yet tested or identified to increase cancer risk.    We discussed the potential to find a Variant of Uncertain Significance or VUS.  These are variants that have not yet been identified as pathogenic or benign, and it is unknown if this variant is associated with increased cancer risk or if this is a normal finding.  Most VUS's are reclassified to benign or likely benign.   It should not be used to make medical management decisions. With time, we suspect the lab will determine the significance of any VUS's identified if any.   Based on Haley Mcdonald's family history of cancer, she meets medical criteria for genetic testing. Despite that she meets criteria, she may still have an out of pocket cost.  Based on the patient's personal and family history, the statistical model Tyrer-Cuzick was used to estimate her risk of developing breast cancer. This estimates her lifetime risk of developing breast cancer to be approximately 22.5%. This estimation does not take into account any genetic testing results.  The patient's lifetime breast cancer risk is a preliminary estimate based on available information using one of several models endorsed by the [place] (ACS). The ACS recommends consideration of breast MRI screening as an adjunct to mammography for patients at high risk (defined as 20% or greater lifetime risk). A more detailed breast cancer risk assessment can be considered, if clinically indicated.      Haley Mcdonald has been determined to be at high risk for breast cancer.  Therefore, we recommend that annual screening with mammography and breast MRI begin at age 27, or 10 years prior to the age of breast cancer diagnosis in a relative (whichever is earlier).  We discussed that Haley Mcdonald should discuss her individual situation with her referring physician and determine a breast cancer screening plan with which they are both comfortable.    PLAN: After  considering the risks, benefits, and limitations, Haley Mcdonald  provided informed consent to pursue genetic testing and the blood sample was sent to [facility] for analysis of the Common Hereditary Cancers + Melanoma Panel. Results should be available within approximately 2-3 weeks' time, at which point they will be disclosed by telephone to Ms. Clevinger, as will any additional recommendations warranted by these results. Ms. Legner will receive a summary of her genetic counseling visit and a copy of her results once  available. This information will also be available in Epic. We encouraged Ms. Mcgeachy to remain in contact with cancer genetics annually so that we can continuously update the family history and inform her of any changes in cancer genetics and testing that may be of benefit for her family. Ms. Seigler's questions were answered to her satisfaction today. Our contact information was provided should additional questions or concerns arise.  Based on Ms. Yousuf's family history, we recommended her mother, who was diagnosed with breast cancer at age 66 have genetic counseling and testing. Ms. Coen will let us know if we can be of any assistance in coordinating genetic counseling and/or testing for this family member.   Lastly, we encouraged Ms. Sieler to remain in contact with cancer genetics annually so that we can continuously update the family history and inform her of any changes in cancer genetics and testing that may be of benefit for this family.   Ms.  Hodge's questions were answered to her satisfaction today. Our contact information was provided should additional questions or concerns arise. Thank you for the referral and allowing [place] to share in the care of your patient.   Epimenio Foot, MS Genetic Counselor [email or web address] Phone: [phone]  The patient was seen for a total of 30 minutes in face-to-face genetic counseling. This patient was  discussed with Drs. Magrinat, Lindi Adie and/or Burr Medico who agrees with the above.

## 2018-03-14 ENCOUNTER — Other Ambulatory Visit: Payer: Self-pay | Admitting: Family Medicine

## 2018-03-14 MED ORDER — ALBUTEROL SULFATE HFA 108 (90 BASE) MCG/ACT IN AERS: 2.0000 | INHALATION_SPRAY | Freq: Four times a day (QID) | RESPIRATORY_TRACT | 0 refills | Status: DC | PRN

## 2018-03-19 ENCOUNTER — Other Ambulatory Visit: Payer: Self-pay | Admitting: Physician Assistant

## 2018-03-19 MED ORDER — SUMATRIPTAN SUCCINATE 25 MG PO TABS: 25.0000 mg | ORAL_TABLET | ORAL | 0 refills | Status: DC | PRN

## 2018-03-20 ENCOUNTER — Telehealth: Payer: Self-pay | Admitting: Obstetrics and Gynecology

## 2018-03-20 NOTE — Telephone Encounter (Signed)
Spoke with patient regarding benefit for surgery. Patient understood and agreeable. Patient is aware of the cancellation policy. Patient aware this is professional benefit only. Patient aware will be contacted by hospital for separate benefits. Patient is ready to proceed with scheduling. Patient is looking to schedule in October, due to her work schedule. Forwarding to Conservation officer, historic buildings for scheduling  Routing to Lamont Snowball, RN

## 2018-03-21 ENCOUNTER — Ambulatory Visit: Payer: Self-pay | Admitting: Licensed Clinical Social Worker

## 2018-03-21 ENCOUNTER — Telehealth: Payer: Self-pay | Admitting: Licensed Clinical Social Worker

## 2018-03-21 ENCOUNTER — Encounter: Payer: Self-pay | Admitting: Licensed Clinical Social Worker

## 2018-03-21 DIAGNOSIS — Z1379 Encounter for other screening for genetic and chromosomal anomalies: Secondary | ICD-10-CM | POA: Insufficient documentation

## 2018-03-21 NOTE — Progress Notes (Signed)
error 

## 2018-03-21 NOTE — Progress Notes (Signed)
HPI:  Haley Mcdonald was previously seen in the [facility] on 03/13/2018 due to a family history of breast cancer and concerns regarding a hereditary predisposition to cancer. Please refer to our prior cancer genetics clinic note for more information regarding Haley Mcdonald's medical, social and family histories, and our assessment and recommendations, at the time. Haley Mcdonald's recent genetic test results were disclosed to her, as well as recommendations warranted by these results. These results and recommendations are discussed in more detail below.  CANCER HISTORY:   No history exists.     FAMILY HISTORY:  We obtained a detailed, 4-generation family history.  Significant diagnoses are listed below: Family History  Problem Relation Age of Onset  . Breast cancer Mother 37       "3x"  . Lung cancer Father   . Breast cancer Maternal Aunt 44  . Lymphoma Paternal Grandmother 42  . Colon cancer Maternal Grandfather 81  . Melanoma Paternal Grandfather 33  . Melanoma Cousin    Haley Mcdonald has three sons, no cancer history. She has one full sister, Haley Mcdonald, who has a son. She has two half-sisters and two-half brothers through her dad with no cancer history and another half-sister through her dad that died at 14.    Haley Mcdonald's father had lung cancer at 53 and died at 58. He had 2 brothers and 3 sisters. One of his sisters had sarcoma at 80 and died at 44. Haley Mcdonald has 10 paternal cousins, one has a history of melanoma. Haley Mcdonald's paternal grandmother had lymphoma at 27 and died at 1. Her paternal grandfather had melanoma at 7 and died at 28.   Haley Mcdonald's mother, Haley Mcdonald, has a history of breast cancer diagnosed at 72. It sounded to me that she was describing DCIS but she wasn't sure. Haley Mcdonald reported that they found cancer three times. Haley Mcdonald also reported that her mom had negative BRCA testing 15 years ago. Haley Mcdonald's mother has two brothers who  are living, no cancer history. She had a sister who was diagnosed with breast cancer at 34 and died at 6. Haley Mcdonald has 9 maternal cousins with no cancer history. Haley Mcdonald's maternal grandmother died at 27 and her paternal grandfather had colon cancer at 35 and died at 35.    Haley Mcdonald is aware of previous family history of genetic testing for hereditary cancer risks. She reports both she and her mother were negative for BRCA1/2 15 years ago. Patient's maternal ancestors are of Caucasian descent, and paternal ancestors are of Caucasian descent. There is no reported Ashkenazi Jewish ancestry. There is no known consanguinity.  GENETIC TEST RESULTS: Genetic testing performed through [facility]'s Common Hereditary Cancers Panel + Melanoma Panel reported out on 03/21/2018 showed no pathogenic mutations. The Common Hereditary Cancer Panel offered by [facility] includes sequencing and/or deletion duplication testing of the following 47 genes: APC, ATM, AXIN2, BARD1, BMPR1A, BRCA1, BRCA2, BRIP1, CDH1, CDKN2A (p14ARF), CDKN2A (p16INK4a), CKD4, CHEK2, CTNNA1, DICER1, EPCAM (Deletion/duplication testing only), GREM1 (promoter region deletion/duplication testing only), KIT, MEN1, MLH1, MSH2, MSH3, MSH6, MUTYH, NBN, NF1, NHTL1, PALB2, PDGFRA, PMS2, POLD1, POLE, PTEN, RAD50, RAD51C, RAD51D, SDHB, SDHC, SDHD, SMAD4, SMARCA4. STK11, TP53, TSC1, TSC2, and VHL.  The following genes were evaluated for sequence changes only: SDHA and HOXB13 c.251G>A variant only. The Melanoma panel offered by [facility] includes sequencing and/or deletion duplication testing of the following 12 genes: BAP1, BRCA1, BRCA2, BRIP1, CDK4, CDKN2A (p14ARF), CDKN2A (p16INK4a), MC1R, POT1, PTEN,  RB1, TERT, and TP53.  The following gene was evaluated for sequence changes only: MITF (c.952G>A, p.GLU318Lys variant only).  The test report will be scanned into EPIC and will be located under the Molecular Pathology section of the Results Review tab. A portion  of the result report is included below for reference.     We discussed with Haley Mcdonald that because current genetic testing is not perfect, it is possible there may be a gene mutation in one of these genes that current testing cannot detect, but that chance is small.  We also discussed, that there could be another gene that has not yet been discovered, or that we have not yet tested, that is responsible for the cancer diagnoses in the family. It is also possible there is a hereditary cause for the cancer in the family that Haley Mcdonald did not inherit and therefore was not identified in her testing.  Therefore, it is important to remain in touch with cancer genetics in the future so that we can continue to offer Haley Mcdonald the most up to date genetic testing.   ADDITIONAL GENETIC TESTING: We discussed with Haley Mcdonald that there are other genes that are associated with increased cancer risk that can be analyzed. The laboratories that offer this testing look at these additional genes via a hereditary cancer gene panel. Should Haley Mcdonald wish to pursue additional genetic testing, we are happy to discuss and coordinate this testing, at any time.    CANCER SCREENING RECOMMENDATIONS: This negative result means that we were unable to identify a hereditary cause for her  family history of cancer at this time.  This result does not rule out a hereditary cause for her family's history.  It is still possible that there could be genetic mutations that are undetectable by current technology, or genetic mutations in genes that have not been tested or identified to increase cancer risk.  While reassuring, this does not definitively rule out a hereditary predisposition to cancer. It is still possible that there could be genetic mutations that are undetectable by current technology, or genetic mutations in genes that have not been tested or identified to increase cancer risk.  Therefore, it is recommended she  continue to follow the cancer management and screening guidelines provided by her oncology and primary healthcare provider. An individual's cancer risk is not determined by genetic test results alone.  Overall cancer risk assessment includes additional factors such as personal medical history, family history, etc.  These should be used to make a personalized plan for cancer prevention and surveillance.    This normal indicates that it is unlikely Ms. Shurtz has an increased risk of cancer due to a mutation in one of these genes.  Therefore, Ms. Spring was advised to continue following the cancer screening guidelines provided by her primary healthcare providers. Other factors such as her personal and family history may still affect her cancer risk.    Based on the Ms. Blow's personal and family history of cancer, as well as her genetic test results, statistical model (Tyrer-Cuzick) was used to estimate her risk of developing breast cancer. This estimates her lifetime risk of developing breast cancer to be approximately 19.6%.  The patient's lifetime breast cancer risk is a preliminary estimate based on available information using one of several models endorsed by the [place] (ACS). The ACS recommends consideration of breast MRI screening as an adjunct to mammography for patients at high risk (defined as 20% or greater lifetime  risk).    ]Ms. Fessenden has been determined to be close to the high risk cutoff for breast cancer. We discussed that Ms. Mcdonnell should discuss her individual situation with her referring physician and determine a breast cancer screening plan with which they are both comfortable.  We, therefore, discussed that it is reasonable for Ms. Schoeller to be followed by a high-risk breast cancer clinic; in addition to a yearly mammogram and physical exam by a healthcare provider, she should discuss the usefulness of an annual breast MRI with the high-risk clinic  providers. We also discussed that a transvaginal ultrasound and a CA-125 blood test can be considered for ovarian cancer screening, although these tests have not been shown to be effective in detecting this cancer early.  Thus, some women at increased risk of ovarian cancer choose to undergo removal of the ovaries and fallopian tubes. We recommended Ms. Gane discuss these options further with her gynecologist, oncology and/or primary providers.  RECOMMENDATIONS FOR FAMILY MEMBERS:  Relatives in this family might be at some increased risk of developing cancer, over the general population risk, simply due to the family history of cancer.  We recommended women in this family have a yearly mammogram beginning at age 2, or 43 years younger than the earliest onset of cancer, an annual clinical breast exam, and perform monthly breast self-exams. Women in this family should also have a gynecological exam as recommended by their primary provider. All family members should have a colonoscopy by age 35 (or as directed by their doctors).  All family members should inform their physicians about the family history of cancer so their doctors can make the most appropriate screening recommendations for them.   It is also possible there is a hereditary cause for the cancer in Ms. Principato's family that she did not inherit and therefore was not identified in her.  We recommended her mother and her maternal cousins have genetic counseling and testing. Ms. Deal will let us know if we can be of any assistance in coordinating genetic counseling and/or testing for these family members.   FOLLOW-UP: Lastly, we discussed with Ms. Placido that cancer genetics is a rapidly advancing field and it is possible that new genetic tests will be appropriate for her and/or her family members in the future. We encouraged her to remain in contact with cancer genetics on an annual basis so we can update her personal and family histories  and let her know of advances in cancer genetics that may benefit this family.   Our contact number was provided. Ms. Mcginness's questions were answered to her satisfaction, and she knows she is welcome to call us at anytime with additional questions or concerns.   Epimenio Foot, MS Genetic Counselor [email or web address] Phone: [phone]

## 2018-03-21 NOTE — Telephone Encounter (Signed)
Spoke with Ms. Haley Mcdonald regarding her negative genetic test results. Let her know that she is right at the edge of being high risk for breast cancer and to talk more with her doctor regarding breast MRIs. We also discussed that her other family members may still need genetic counseling/testing.

## 2018-03-26 ENCOUNTER — Other Ambulatory Visit: Payer: Self-pay | Admitting: Family Medicine

## 2018-03-26 MED ORDER — LISDEXAMFETAMINE DIMESYLATE 50 MG PO CHEW: 1.0000 | CHEWABLE_TABLET | Freq: Every day | ORAL | 0 refills | Status: DC

## 2018-03-26 NOTE — Telephone Encounter (Signed)
Call to patient. Per ROI, can leave message on voice mail, which has number confirmation.  Left message calling to review scheduling options.

## 2018-03-26 NOTE — Telephone Encounter (Signed)
Call from patient. Date options for October discussed. Patient will consider these and call back when ready to schedule.   Routing to provider for final review. Will close encounter.

## 2018-04-01 ENCOUNTER — Telehealth: Payer: Self-pay | Admitting: Obstetrics and Gynecology

## 2018-04-01 NOTE — Telephone Encounter (Signed)
Call to patient. Requests surgery date of 06-02-18. Surgery instruction sheet reviewed and printed copy will be mailed to patient.  Patient will have FMLA forms sent to office.   Encounter closed.

## 2018-04-01 NOTE — Telephone Encounter (Signed)
Patient is asking to talk with Gay Filler about her surgery.

## 2018-05-12 ENCOUNTER — Other Ambulatory Visit: Payer: Self-pay | Admitting: Family Medicine

## 2018-05-12 DIAGNOSIS — F411 Generalized anxiety disorder: Secondary | ICD-10-CM

## 2018-05-13 ENCOUNTER — Other Ambulatory Visit: Payer: Self-pay | Admitting: Family Medicine

## 2018-05-13 MED ORDER — LISDEXAMFETAMINE DIMESYLATE 50 MG PO CHEW: 1.0000 | CHEWABLE_TABLET | Freq: Every day | ORAL | 0 refills | Status: DC

## 2018-05-13 NOTE — Progress Notes (Signed)
GYNECOLOGY  VISIT   HPI: 37 y.o.   Married White or Caucasian Not Hispanic or Latino  female   [phone] with No LMP recorded. Patient has had an ablation.   here for surgery consult for TOTAL LAPAROSCOPIC HYSTERECTOMY WITH SALPINGECTOMY.   The patient has a h/o menometrorrhagia and worsening, severe dysmenorrhea. Prior h/o endometrial ablation. Normal CBC, high normal TSH. She is not a candidate for OCP's or a mirena IUD.  Sonohysterogram in 7/19 showed a scarred endometrial cavity and ? Of a small polyp.  Endometrial biopsy was negative in 7/19. Negative pap and hpv in 7/19.  She has a FH of breast cancer, recent negative genetic testing. Risk of breast cancer is still just under 20%.  GYNECOLOGIC HISTORY: No LMP recorded. Patient has had an ablation. Contraception: tubal ligation Menopausal hormone therapy: none   Social history: Smoking 4 cigarettes a day Rare ETOH Works in [employer] office        OB History    Gravida  6   Para  3   Term  2   Preterm  1   AB  3   Living  3     SAB  3   TAB      Ectopic      Multiple      Live Births  3              Patient Active Problem List   Diagnosis Date Noted  . Genetic testing 03/21/2018  . Family history of breast cancer   . Family history of melanoma   . Family history of lymphoma   . Cervical dysplasia     Past Medical History:  Diagnosis Date  . ADD (attention deficit disorder) without hyperactivity   . Anxiety   . Asthma    as a child  . Cervical dysplasia   . Family history of breast cancer   . Family history of lymphoma   . Family history of melanoma   . Headache   . Migraine with aura     Past Surgical History:  Procedure Laterality Date  . CERVICAL BIOPSY  W/ LOOP ELECTRODE EXCISION    . CESAREAN SECTION    . DILITATION & CURRETTAGE/HYSTROSCOPY WITH NOVASURE ABLATION N/A 04/01/2015   Procedure: DILATATION & CURETTAGE/HYSTEROSCOPY WITH NOVASURE ABLATION;  Surgeon: [name], MD;   Location: [facility] ORS;  Service: Gynecology;  Laterality: N/A;  . ENDOMETRIAL ABLATION    . TONSILLECTOMY    . TUBAL LIGATION    2 NSVD then one C/S. Largest vaginal delivery was 7 lb 11 oz  Current Outpatient Medications  Medication Sig Dispense Refill  . albuterol (PROVENTIL HFA;VENTOLIN HFA) 108 (90 Base) MCG/ACT inhaler Inhale 2 puffs into the lungs every 6 (six) hours as needed for wheezing or shortness of breath. 1 Inhaler 0  . Cholecalciferol 50000 units TABS 50,000 units PO qwk for 12 weeks. (Patient not taking: Reported on 05/08/2018) 12 tablet 0  . escitalopram (LEXAPRO) 10 MG tablet TAKE 1 TABLET(10 MG) BY MOUTH DAILY 90 tablet 2  . ibuprofen (ADVIL,MOTRIN) 800 MG tablet Take 1 tablet (800 mg total) by mouth every 8 (eight) hours as needed for moderate pain. (Patient not taking: Reported on 05/08/2018) 30 tablet 1  . Lisdexamfetamine Dimesylate (VYVANSE) 50 MG CHEW Chew 1 tablet by mouth daily. 30 tablet 0  . SUMAtriptan (IMITREX) 25 MG tablet Take 1 tablet (25 mg total) by mouth every 2 (two) hours as needed for migraine. May repeat  in 2 hours if headache persists or recurs. 10 tablet 0   No current facility-administered medications for this visit.      ALLERGIES: Penicillins and Bupropion  Anaphylaxis with PCN  Family History  Problem Relation Age of Onset  . Breast cancer Mother 21       "3x"  . Lung cancer Father   . Breast cancer Maternal Aunt 40  . Lymphoma Paternal Grandmother 100  . Colon cancer Maternal Grandfather 75  . Melanoma Paternal Grandfather 35  . Melanoma Cousin     Social History   Socioeconomic History  . Marital status: Married    Spouse name: Not on file  . Number of children: Not on file  . Years of education: Not on file  . Highest education level: Not on file  Occupational History  . Not on file  Social Needs  . Financial resource strain: Not on file  . Food insecurity:    Worry: Not on file    Inability: Not on file  . Transportation  needs:    Medical: Not on file    Non-medical: Not on file  Tobacco Use  . Smoking status: Current Every Day Smoker    Packs/day: 0.50    Types: Cigarettes  . Smokeless tobacco: Never Used  Substance and Sexual Activity  . Alcohol use: Yes    Comment: occasionally   . Drug use: No  . Sexual activity: Yes    Birth control/protection: Surgical    Comment: BTL   Lifestyle  . Physical activity:    Days per week: Not on file    Minutes per session: Not on file  . Stress: Not on file  Relationships  . Social connections:    Talks on phone: Not on file    Gets together: Not on file    Attends religious service: Not on file    Active member of club or organization: Not on file    Attends meetings of clubs or organizations: Not on file    Relationship status: Married  . Intimate partner violence:    Fear of current or ex partner: Not on file    Emotionally abused: Not on file    Physically abused: Not on file    Forced sexual activity: Not on file  Other Topics Concern  . Not on file  Social History Narrative  . Not on file    Review of Systems  Constitutional: Negative.   HENT: Negative.   Eyes: Negative.   Respiratory: Negative.   Cardiovascular: Negative.   Gastrointestinal: Negative.   Endocrine: Negative.   Genitourinary: Negative.   Musculoskeletal: Negative.   Skin: Negative.   Allergic/Immunologic: Negative.   Neurological: Negative.   Hematological: Negative.   Psychiatric/Behavioral: Negative.   All other systems reviewed and are negative.   PHYSICAL EXAMINATION:    There were no vitals taken for this visit.    General appearance: alert, cooperative and appears stated age Neck: no adenopathy, supple, symmetrical, trachea midline and thyroid normal to inspection and palpation Heart: regular rate and rhythm Lungs: CTAB Abdomen: soft, non-tender; bowel sounds normal; no masses,  no organomegaly Extremities: normal, atraumatic, no cyanosis Skin: normal  color, texture and turgor, no rashes or lesions Lymph: normal cervical supraclavicular and inguinal nodes Neurologic: grossly normal     ASSESSMENT Menometorrhagia, severe dysmenorrhea, h/o endometrial ablation. Not a candidate for OCP's or mirena IUD    PLAN Discussed total laparoscopic hysterectomy, bilateral salpingectomy and cystoscopy. Reviewed the risks of  the procedure, including infection, bleeding, damage to bowel/badder/vessels/ureters.  Discussed the possible need for laparotomy. Discussed post operative recovery. All of her questions were answered The genetics counselor talked with her about the possibility of BSO, we discussed the risks. Patient with negative genetic testing and no FH of ovarian cancer. 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 We will see how she is doing post operatively    An After Visit Summary was printed and given to the patient.  CC: Dr Juleen China

## 2018-05-15 ENCOUNTER — Other Ambulatory Visit: Payer: Self-pay

## 2018-05-15 ENCOUNTER — Encounter: Payer: Self-pay | Admitting: Obstetrics and Gynecology

## 2018-05-15 ENCOUNTER — Ambulatory Visit (INDEPENDENT_AMBULATORY_CARE_PROVIDER_SITE_OTHER): Admitting: Obstetrics and Gynecology

## 2018-05-15 VITALS — BP 102/70 | HR 68 | Resp 16 | Ht 63.0 in | Wt 168.0 lb

## 2018-05-15 DIAGNOSIS — Z9889 Other specified postprocedural states: Secondary | ICD-10-CM

## 2018-05-15 DIAGNOSIS — N946 Dysmenorrhea, unspecified: Secondary | ICD-10-CM

## 2018-05-15 DIAGNOSIS — Z0289 Encounter for other administrative examinations: Secondary | ICD-10-CM

## 2018-05-15 DIAGNOSIS — N921 Excessive and frequent menstruation with irregular cycle: Secondary | ICD-10-CM

## 2018-05-19 ENCOUNTER — Telehealth: Payer: Self-pay | Admitting: Obstetrics and Gynecology

## 2018-05-19 NOTE — Telephone Encounter (Signed)
Please let the patient know that I was in contact with the genetic counselor. Her lifetime risk of ovarian cancer is 3%. The general population risk os 1.3%.  As per our prior conversation, she has to weigh the risks of having her ovaries out at her young age vs the risk of getting ovarian cancer over her life time. Ultimately the decision is hers, but I think keeping her ovaries at the time of hysterectomy(as we discussed) is a reasonable option. I also don't know how much removing her tubes decreases her risk of ovarian cancer (it should instill some protection).

## 2018-05-19 NOTE — Telephone Encounter (Signed)
-----   Message from Eyvonne Mechanic sent at 05/19/2018  1:10 PM EDT ----- Hi Dr. Talbert Nan!  Santiago Glad forwarded your message regarding this patient to me. Sorry you couldn't find me! I ran the patient's family history through a risk model, BOADICEA, which showed a 3% lifetime risk to age 37 for ovarian cancer for Haley Mcdonald. So, increased over the population risk (1.3%) but not by much. From a genetics/family history standpoint, there aren't any medical recommendations I'm aware of that would indicate that she needs her ovaries out.  I hope this is helpful! Let me know if you need anything else.    Thanks, Faith Rogue

## 2018-05-21 NOTE — Telephone Encounter (Signed)
Called patient and left message to call back. 05/20/18/. (Late entry)  Mychart message sent yesterday 05/20/18 and patient read message.  Will close encounter.

## 2018-05-23 NOTE — Patient Instructions (Addendum)
Your procedure is scheduled on  06-02-18  Report to [place] AM   Call this number if you have problems the morning of surgery  [phone].   OUR ADDRESS IS [place].  WE ARE LOCATED IN THE [place]                                   [facility].                                     REMEMBER:  DO NOT EAT FOOD OR DRINK LIQUIDS AFTER MIDNIGHT .  TAKE THESE MEDICATIONS MORNING OF SURGERY WITH A SIP OF WATER:  ALBUTEROL INHALER IF NEEDED AND BRING INHALER, ESCITALPRAM (LEXAPRO)                                    DO NOT WEAR JEWERLY, MAKE UP, OR NAIL POLISH,  DO NOT WEAR LOTIONS, POWDERS, PERFUMES OR DEODORANT. DO NOT SHAVE FOR 24 HOURS PRIOR TO DAY OF SURGERY. MEN MAY SHAVE FACE AND NECK. CONTACTS, GLASSES, OR DENTURES MAY NOT BE WORN TO SURGERY.                                    [facility] IS NOT RESPONSIBLE  FOR ANY BELONGINGS.                                            [facility] - Preparing for Surgery Before surgery, you can play an important role.  Because skin is not sterile, your skin needs to be as free of germs as possible.  You can reduce the number of germs on your skin by washing with CHG (chlorahexidine gluconate) soap before surgery.  CHG is an antiseptic cleaner which kills germs and bonds with the skin to continue killing germs even after washing. Please DO NOT use if you have an allergy to CHG or antibacterial soaps.  If your skin becomes reddened/irritated stop using the CHG and inform your nurse when you arrive at Short Stay. Do not shave (including legs and underarms) for at least 48 hours prior to the first CHG shower.  You may shave your face/neck. Please follow these instructions carefully:  1.  Shower with CHG Soap the night before surgery and the  morning of Surgery.  2.  If you choose to wash your hair, wash your hair first as usual with your  normal  shampoo.  3.  After you shampoo, rinse your hair and body thoroughly to  remove the  shampoo.                           4.  Use CHG as you would any other liquid soap.  You can apply chg directly  to the skin and wash                       Gently with a scrungie or clean washcloth.  5.  Apply the CHG Soap  to your body ONLY FROM THE NECK DOWN.   Do not use on face/ open                           Wound or open sores. Avoid contact with eyes, ears mouth and genitals (private parts).                       Wash face,  Genitals (private parts) with your normal soap.             6.  Wash thoroughly, paying special attention to the area where your surgery  will be performed.  7.  Thoroughly rinse your body with warm water from the neck down.  8.  DO NOT shower/wash with your normal soap after using and rinsing off  the CHG Soap.                9.  Pat yourself dry with a clean towel.            10.  Wear clean pajamas.            11.  Place clean sheets on your bed the night of your first shower and do not  sleep with pets. Day of Surgery : Do not apply any lotions/deodorants the morning of surgery.  Please wear clean clothes to the hospital/surgery center.  FAILURE TO FOLLOW THESE INSTRUCTIONS MAY RESULT IN THE CANCELLATION OF YOUR SURGERY PATIENT SIGNATURE_________________________________  NURSE SIGNATURE__________________________________  ________________________________________________________________________                        .                                                  Incentive Spirometer  An incentive spirometer is a tool that can help keep your lungs clear and active. This tool measures how well you are filling your lungs with each breath. Taking long deep breaths may help reverse or decrease the chance of developing breathing (pulmonary) problems (especially infection) following:  A long period of time when you are unable to move or be active. BEFORE THE PROCEDURE   If the spirometer includes an indicator to show your best effort, your nurse or  respiratory therapist will set it to a desired goal.  If possible, sit up straight or lean slightly forward. Try not to slouch.  Hold the incentive spirometer in an upright position. INSTRUCTIONS FOR USE  1. Sit on the edge of your bed if possible, or sit up as far as you can in bed or on a chair. 2. Hold the incentive spirometer in an upright position. 3. Breathe out normally. 4. Place the mouthpiece in your mouth and seal your lips tightly around it. 5. Breathe in slowly and as deeply as possible, raising the piston or the ball toward the top of the column. 6. Hold your breath for 3-5 seconds or for as long as possible. Allow the piston or ball to fall to the bottom of the column. 7. Remove the mouthpiece from your mouth and breathe out normally. 8. Rest for a few seconds and repeat Steps 1 through 7 at least 10 times every 1-2 hours when you are awake. Take your time and take a few normal breaths  between deep breaths. 9. The spirometer may include an indicator to show your best effort. Use the indicator as a goal to work toward during each repetition. 10. After each set of 10 deep breaths, practice coughing to be sure your lungs are clear. If you have an incision (the cut made at the time of surgery), support your incision when coughing by placing a pillow or rolled up towels firmly against it. Once you are able to get out of bed, walk around indoors and cough well. You may stop using the incentive spirometer when instructed by your caregiver.  RISKS AND COMPLICATIONS  Take your time so you do not get dizzy or light-headed.  If you are in pain, you may need to take or ask for pain medication before doing incentive spirometry. It is harder to take a deep breath if you are having pain. AFTER USE  Rest and breathe slowly and easily.  It can be helpful to keep track of a log of your progress. Your caregiver can provide you with a simple table to help with this. If you are using the  spirometer at home, follow these instructions: [place] IF:   You are having difficultly using the spirometer.  You have trouble using the spirometer as often as instructed.  Your pain medication is not giving enough relief while using the spirometer.  You develop fever of 100.5 F (38.1 C) or higher. SEEK IMMEDIATE MEDICAL CARE IF:   You cough up bloody sputum that had not been present before.  You develop fever of 102 F (38.9 C) or greater.  You develop worsening pain at or near the incision site. MAKE SURE YOU:   Understand these instructions.  Will watch your condition.  Will get help right away if you are not doing well or get worse. Document Released: 12/17/2006 Document Revised: 10/29/2011 Document Reviewed: 02/17/2007 [facility] Patient Information 2014 [place], [place].   ________________________________________________________________________

## 2018-05-23 NOTE — H&P (Signed)
GYNECOLOGY  VISIT   HPI: 37 y.o.   Married White or Caucasian Not Hispanic or Latino  female   [phone] with No LMP recorded. Patient has had an ablation.   here for surgery consult for TOTAL LAPAROSCOPIC HYSTERECTOMY WITH SALPINGECTOMY.   The patient has a h/o menometrorrhagia and worsening, severe dysmenorrhea. Prior h/o endometrial ablation. Normal CBC, high normal TSH. She is not a candidate for OCP's or a mirena IUD.  Sonohysterogram in 7/19 showed a scarred endometrial cavity and ? Of a small polyp.  Endometrial biopsy was negative in 7/19. Negative pap and hpv in 7/19.  She has a FH of breast cancer, recent negative genetic testing. Risk of breast cancer is still just under 20%.  GYNECOLOGIC HISTORY: No LMP recorded. Patient has had an ablation. Contraception: tubal ligation Menopausal hormone therapy: none   Social history: Smoking 4 cigarettes a day Rare ETOH Works in [employer] office                OB History    Gravida  6   Para  3   Term  2   Preterm  1   AB  3   Living  3     SAB  3   TAB      Ectopic      Multiple      Live Births  3                  Patient Active Problem List   Diagnosis Date Noted  . Genetic testing 03/21/2018  . Family history of breast cancer   . Family history of melanoma   . Family history of lymphoma   . Cervical dysplasia         Past Medical History:  Diagnosis Date  . ADD (attention deficit disorder) without hyperactivity   . Anxiety   . Asthma    as a child  . Cervical dysplasia   . Family history of breast cancer   . Family history of lymphoma   . Family history of melanoma   . Headache   . Migraine with aura          Past Surgical History:  Procedure Laterality Date  . CERVICAL BIOPSY  W/ LOOP ELECTRODE EXCISION    . CESAREAN SECTION    . DILITATION & CURRETTAGE/HYSTROSCOPY WITH NOVASURE ABLATION N/A 04/01/2015   Procedure: DILATATION &  CURETTAGE/HYSTEROSCOPY WITH NOVASURE ABLATION;  Surgeon: [name], MD;  Location: [facility] ORS;  Service: Gynecology;  Laterality: N/A;  . ENDOMETRIAL ABLATION    . TONSILLECTOMY    . TUBAL LIGATION    2 NSVD then one C/S. Largest vaginal delivery was 7 lb 11 oz        Current Outpatient Medications  Medication Sig Dispense Refill  . albuterol (PROVENTIL HFA;VENTOLIN HFA) 108 (90 Base) MCG/ACT inhaler Inhale 2 puffs into the lungs every 6 (six) hours as needed for wheezing or shortness of breath. 1 Inhaler 0  . Cholecalciferol 50000 units TABS 50,000 units PO qwk for 12 weeks. (Patient not taking: Reported on 05/08/2018) 12 tablet 0  . escitalopram (LEXAPRO) 10 MG tablet TAKE 1 TABLET(10 MG) BY MOUTH DAILY 90 tablet 2  . ibuprofen (ADVIL,MOTRIN) 800 MG tablet Take 1 tablet (800 mg total) by mouth every 8 (eight) hours as needed for moderate pain. (Patient not taking: Reported on 05/08/2018) 30 tablet 1  . Lisdexamfetamine Dimesylate (VYVANSE) 50 MG CHEW Chew 1 tablet by mouth daily. [place]  tablet 0  . SUMAtriptan (IMITREX) 25 MG tablet Take 1 tablet (25 mg total) by mouth every 2 (two) hours as needed for migraine. May repeat in 2 hours if headache persists or recurs. 10 tablet 0   No current facility-administered medications for this visit.      ALLERGIES: Penicillins and Bupropion  Anaphylaxis with PCN       Family History  Problem Relation Age of Onset  . Breast cancer Mother 70       "3x"  . Lung cancer Father   . Breast cancer Maternal Aunt 54  . Lymphoma Paternal Grandmother 37  . Colon cancer Maternal Grandfather 15  . Melanoma Paternal Grandfather 70  . Melanoma Cousin     Social History        Socioeconomic History  . Marital status: Married    Spouse name: Not on file  . Number of children: Not on file  . Years of education: Not on file  . Highest education level: Not on file  Occupational History  . Not on file  Social Needs  . Financial  resource strain: Not on file  . Food insecurity:    Worry: Not on file    Inability: Not on file  . Transportation needs:    Medical: Not on file    Non-medical: Not on file  Tobacco Use  . Smoking status: Current Every Day Smoker    Packs/day: 0.50    Types: Cigarettes  . Smokeless tobacco: Never Used  Substance and Sexual Activity  . Alcohol use: Yes    Comment: occasionally   . Drug use: No  . Sexual activity: Yes    Birth control/protection: Surgical    Comment: BTL   Lifestyle  . Physical activity:    Days per week: Not on file    Minutes per session: Not on file  . Stress: Not on file  Relationships  . Social connections:    Talks on phone: Not on file    Gets together: Not on file    Attends religious service: Not on file    Active member of club or organization: Not on file    Attends meetings of clubs or organizations: Not on file    Relationship status: Married  . Intimate partner violence:    Fear of current or ex partner: Not on file    Emotionally abused: Not on file    Physically abused: Not on file    Forced sexual activity: Not on file  Other Topics Concern  . Not on file  Social History Narrative  . Not on file    Review of Systems  Constitutional: Negative.   HENT: Negative.   Eyes: Negative.   Respiratory: Negative.   Cardiovascular: Negative.   Gastrointestinal: Negative.   Endocrine: Negative.   Genitourinary: Negative.   Musculoskeletal: Negative.   Skin: Negative.   Allergic/Immunologic: Negative.   Neurological: Negative.   Hematological: Negative.   Psychiatric/Behavioral: Negative.   All other systems reviewed and are negative.   PHYSICAL EXAMINATION:    There were no vitals taken for this visit.    General appearance: alert, cooperative and appears stated age Neck: no adenopathy, supple, symmetrical, trachea midline and thyroid normal to inspection and palpation Heart: regular  rate and rhythm Lungs: CTAB Abdomen: soft, non-tender; bowel sounds normal; no masses,  no organomegaly Extremities: normal, atraumatic, no cyanosis Skin: normal color, texture and turgor, no rashes or lesions Lymph: normal cervical supraclavicular and inguinal nodes Neurologic:  grossly normal     ASSESSMENT Menometorrhagia, severe dysmenorrhea, h/o endometrial ablation. Not a candidate for OCP's or mirena IUD    PLAN Discussed total laparoscopic hysterectomy, bilateral salpingectomy and cystoscopy. Reviewed the risks of the procedure, including infection, bleeding, damage to bowel/badder/vessels/ureters.  Discussed the possible need for laparotomy. Discussed post operative recovery. All of her questions were answered The genetics counselor talked with her about the possibility of BSO, we discussed the risks. Patient with negative genetic testing and no FH of ovarian cancer. 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 We will see how she is doing post operatively    An After Visit Summary was printed and given to the patient.  CC: Dr Juleen China

## 2018-05-29 ENCOUNTER — Encounter (HOSPITAL_COMMUNITY)
Admission: RE | Admit: 2018-05-29 | Discharge: 2018-05-29 | Disposition: A | Discharge status: Home / Self Care | Source: Ambulatory Visit | Attending: Obstetrics and Gynecology | Admitting: Obstetrics and Gynecology

## 2018-05-29 ENCOUNTER — Other Ambulatory Visit: Payer: Self-pay

## 2018-05-29 ENCOUNTER — Encounter (HOSPITAL_COMMUNITY): Payer: Self-pay

## 2018-05-29 DIAGNOSIS — Z01812 Encounter for preprocedural laboratory examination: Secondary | ICD-10-CM | POA: Insufficient documentation

## 2018-05-29 DIAGNOSIS — N939 Abnormal uterine and vaginal bleeding, unspecified: Secondary | ICD-10-CM | POA: Insufficient documentation

## 2018-05-29 HISTORY — DX: Nausea with vomiting, unspecified: R11.2

## 2018-05-29 HISTORY — DX: Prediabetes: R73.03

## 2018-05-29 HISTORY — DX: Other specified postprocedural states: Z98.890

## 2018-05-29 LAB — COMPREHENSIVE METABOLIC PANEL
ALK PHOS: 81 U/L (ref 38–126)
ALT: 20 U/L (ref 0–44)
AST: 19 U/L (ref 15–41)
Albumin: 4.5 g/dL (ref 3.5–5.0)
Anion gap: 12 (ref 5–15)
BUN: 10 mg/dL (ref 6–20)
CALCIUM: 9.4 mg/dL (ref 8.9–10.3)
CHLORIDE: 102 mmol/L (ref 98–111)
CO2: 26 mmol/L (ref 22–32)
CREATININE: 0.81 mg/dL (ref 0.44–1.00)
GFR calc Af Amer: >60 mL/min (ref >60)
GFR calc non Af Amer: >60 mL/min (ref >60)
Glucose, Bld: 111 mg/dL — ABNORMAL HIGH (ref 70–99)
Potassium: 4 mmol/L (ref 3.5–5.1)
Sodium: 140 mmol/L (ref 135–145)
Total Bilirubin: 0.4 mg/dL (ref 0.3–1.2)
Total Protein: 7.3 g/dL (ref 6.5–8.1)

## 2018-05-29 LAB — CBC
HEMATOCRIT: 47.3 % — AB (ref 36.0–46.0)
HEMOGLOBIN: 15.4 g/dL — AB (ref 12.0–15.0)
MCH: 28.6 pg (ref 26.0–34.0)
MCHC: 32.6 g/dL (ref 30.0–36.0)
MCV: 87.9 fL (ref 80.0–100.0)
NRBC: 0 % (ref 0.0–0.2)
PLATELETS: 231 10*3/uL (ref 150–400)
RBC: 5.38 MIL/uL — AB (ref 3.87–5.11)
RDW: 13.6 % (ref 11.5–15.5)
WBC: 11.1 10*3/uL — ABNORMAL HIGH (ref 4.0–10.5)

## 2018-05-30 ENCOUNTER — Telehealth: Payer: Self-pay | Admitting: *Deleted

## 2018-05-30 ENCOUNTER — Other Ambulatory Visit: Payer: Self-pay | Admitting: Obstetrics and Gynecology

## 2018-05-30 DIAGNOSIS — R7309 Other abnormal glucose: Secondary | ICD-10-CM

## 2018-05-30 LAB — HEMOGLOBIN A1C
Hgb A1c MFr Bld: 5.8 % — ABNORMAL HIGH (ref 4.8–5.6)
Mean Plasma Glucose: 119.76 mg/dL

## 2018-05-30 NOTE — Progress Notes (Signed)
Received call from dr Talbert Nan office to add A1c to labs drawn yesterday afternoon.  Placed order in epic.

## 2018-05-30 NOTE — Telephone Encounter (Signed)
-----   Message from Salvadore Dom, MD sent at 05/29/2018  5:42 PM EDT ----- Please add a hgba1c if possible to her lab work and make sure she isn't have any symptoms of infection. Her WBC was mildly elevated and she is scheduled for surgery on Monday. Thanks

## 2018-05-30 NOTE — Telephone Encounter (Signed)
Notes recorded by Burnice Logan, RN on 05/30/2018 at 1:16 PM EDT Left detailed message, ok per dpr. Advised as seen below per Dr. Talbert Nan. Requested return call to office today to further assess. ------  Notes recorded by Burnice Logan, RN on 05/30/2018 at 1:12 PM EDT Langley Gauss returned call to office, hgbA1c added to labs previously drawn. ------  Notes recorded by Burnice Logan, RN on 05/30/2018 at 11:25 AM EDT Call placed to [facility] pre-op/[facility]. Was advised she will check with lab to confirm hgbA1c can be added and will return call. ------

## 2018-05-30 NOTE — Progress Notes (Signed)
Add on Hemoglobin A1C patient had drawn at Pre admissions testing.

## 2018-06-01 NOTE — Anesthesia Preprocedure Evaluation (Addendum)
Anesthesia Evaluation  Patient identified by MRN, date of birth, ID band Patient awake    Reviewed: Allergy & Precautions, NPO status , Patient's Chart, lab work & pertinent test results  History of Anesthesia Complications (+) PONV and history of anesthetic complications  Airway Mallampati: II  TM Distance: >3 FB Neck ROM: Full    Dental no notable dental hx. (+) Dental Advisory Given   Pulmonary asthma , Current Smoker,    Pulmonary exam normal breath sounds clear to auscultation       Cardiovascular negative cardio ROS Normal cardiovascular exam Rhythm:Regular Rate:Normal     Neuro/Psych  Headaches, PSYCHIATRIC DISORDERS Anxiety ADD (attention deficit disorder) without hyperactivity   GI/Hepatic negative GI ROS, Neg liver ROS,   Endo/Other  negative endocrine ROS  Renal/GU negative Renal ROS     Musculoskeletal negative musculoskeletal ROS (+)   Abdominal   Peds  Hematology negative hematology ROS (+)   Anesthesia Other Findings AUB, worsening dysmenorrhea Previous endometrial ablation    Day of surgery medications reviewed with the patient.  Reproductive/Obstetrics hcg negative                          Anesthesia Physical Anesthesia Plan  ASA: II  Anesthesia Plan: General   Post-op Pain Management:    Induction: Intravenous  PONV Risk Score and Plan: 4 or greater and Midazolam, Dexamethasone, Ondansetron, Treatment may vary due to age or medical condition and Scopolamine patch - Pre-op  Airway Management Planned: Oral ETT  Additional Equipment:   Intra-op Plan:   Post-operative Plan: Extubation in OR  Informed Consent: I have reviewed the patients History and Physical, chart, labs and discussed the procedure including the risks, benefits and alternatives for the proposed anesthesia with the patient or authorized representative who has indicated his/her understanding and  acceptance.   Dental advisory given  Plan Discussed with: CRNA  Anesthesia Plan Comments:        Anesthesia Quick Evaluation

## 2018-06-02 ENCOUNTER — Ambulatory Visit (HOSPITAL_BASED_OUTPATIENT_CLINIC_OR_DEPARTMENT_OTHER): Admitting: Anesthesiology

## 2018-06-02 ENCOUNTER — Encounter (HOSPITAL_BASED_OUTPATIENT_CLINIC_OR_DEPARTMENT_OTHER): Payer: Self-pay

## 2018-06-02 ENCOUNTER — Encounter (HOSPITAL_BASED_OUTPATIENT_CLINIC_OR_DEPARTMENT_OTHER)
Admission: RE | Disposition: A | Discharge status: Home / Self Care | Payer: Self-pay | Source: Ambulatory Visit | Attending: Obstetrics and Gynecology

## 2018-06-02 ENCOUNTER — Ambulatory Visit (HOSPITAL_BASED_OUTPATIENT_CLINIC_OR_DEPARTMENT_OTHER)
Admission: RE | Admit: 2018-06-02 | Discharge: 2018-06-02 | Disposition: A | Discharge status: Home / Self Care | Source: Ambulatory Visit | Attending: Obstetrics and Gynecology | Admitting: Obstetrics and Gynecology

## 2018-06-02 DIAGNOSIS — Z888 Allergy status to other drugs, medicaments and biological substances status: Secondary | ICD-10-CM | POA: Insufficient documentation

## 2018-06-02 DIAGNOSIS — R7303 Prediabetes: Secondary | ICD-10-CM | POA: Diagnosis not present

## 2018-06-02 DIAGNOSIS — F988 Other specified behavioral and emotional disorders with onset usually occurring in childhood and adolescence: Secondary | ICD-10-CM | POA: Insufficient documentation

## 2018-06-02 DIAGNOSIS — N858 Other specified noninflammatory disorders of uterus: Secondary | ICD-10-CM | POA: Insufficient documentation

## 2018-06-02 DIAGNOSIS — N921 Excessive and frequent menstruation with irregular cycle: Secondary | ICD-10-CM | POA: Insufficient documentation

## 2018-06-02 DIAGNOSIS — N939 Abnormal uterine and vaginal bleeding, unspecified: Secondary | ICD-10-CM

## 2018-06-02 DIAGNOSIS — J45909 Unspecified asthma, uncomplicated: Secondary | ICD-10-CM | POA: Insufficient documentation

## 2018-06-02 DIAGNOSIS — E88819 Insulin resistance, unspecified: Secondary | ICD-10-CM | POA: Diagnosis present

## 2018-06-02 DIAGNOSIS — Z880 Allergy status to penicillin: Secondary | ICD-10-CM | POA: Diagnosis not present

## 2018-06-02 DIAGNOSIS — E8881 Metabolic syndrome: Secondary | ICD-10-CM | POA: Diagnosis present

## 2018-06-02 DIAGNOSIS — N946 Dysmenorrhea, unspecified: Secondary | ICD-10-CM | POA: Diagnosis not present

## 2018-06-02 DIAGNOSIS — N838 Other noninflammatory disorders of ovary, fallopian tube and broad ligament: Secondary | ICD-10-CM | POA: Insufficient documentation

## 2018-06-02 DIAGNOSIS — F419 Anxiety disorder, unspecified: Secondary | ICD-10-CM | POA: Insufficient documentation

## 2018-06-02 DIAGNOSIS — F17210 Nicotine dependence, cigarettes, uncomplicated: Secondary | ICD-10-CM | POA: Diagnosis not present

## 2018-06-02 DIAGNOSIS — Z79899 Other long term (current) drug therapy: Secondary | ICD-10-CM | POA: Insufficient documentation

## 2018-06-02 DIAGNOSIS — Z90710 Acquired absence of both cervix and uterus: Secondary | ICD-10-CM | POA: Diagnosis present

## 2018-06-02 DIAGNOSIS — Z8742 Personal history of other diseases of the female genital tract: Secondary | ICD-10-CM | POA: Diagnosis not present

## 2018-06-02 DIAGNOSIS — N736 Female pelvic peritoneal adhesions (postinfective): Secondary | ICD-10-CM | POA: Diagnosis not present

## 2018-06-02 HISTORY — PX: TOTAL LAPAROSCOPIC HYSTERECTOMY WITH SALPINGECTOMY: SHX6742

## 2018-06-02 HISTORY — PX: CYSTOSCOPY: SHX5120

## 2018-06-02 LAB — CBC
HCT: 42.8 % (ref 36.0–46.0)
HEMOGLOBIN: 13.8 g/dL (ref 12.0–15.0)
MCH: 28.7 pg (ref 26.0–34.0)
MCHC: 32.2 g/dL (ref 30.0–36.0)
MCV: 89 fL (ref 80.0–100.0)
NRBC: 0 % (ref 0.0–0.2)
PLATELETS: 185 10*3/uL (ref 150–400)
RBC: 4.81 MIL/uL (ref 3.87–5.11)
RDW: 13.5 % (ref 11.5–15.5)
WBC: 14.2 10*3/uL — ABNORMAL HIGH (ref 4.0–10.5)

## 2018-06-02 LAB — POCT PREGNANCY, URINE: PREG TEST UR: NEGATIVE

## 2018-06-02 LAB — GLUCOSE, CAPILLARY: Glucose-Capillary: 99 mg/dL (ref 70–99)

## 2018-06-02 SURGERY — HYSTERECTOMY, TOTAL, LAPAROSCOPIC, WITH SALPINGECTOMY
Anesthesia: General | Site: Bladder

## 2018-06-02 MED ORDER — MENTHOL 3 MG MT LOZG
1.0000 | LOZENGE | OROMUCOSAL | Status: DC | PRN
  Filled 2018-06-02: qty 9

## 2018-06-02 MED ORDER — LIDOCAINE 2% (20 MG/ML) 5 ML SYRINGE
INTRAMUSCULAR | Status: DC | PRN
  Administered 2018-06-02: 60 mg via INTRAVENOUS

## 2018-06-02 MED ORDER — KETOROLAC TROMETHAMINE 30 MG/ML IJ SOLN
INTRAMUSCULAR | Status: AC
  Filled 2018-06-02: qty 1

## 2018-06-02 MED ORDER — DOCUSATE SODIUM 100 MG PO CAPS
ORAL_CAPSULE | ORAL | Status: AC
  Filled 2018-06-02: qty 1

## 2018-06-02 MED ORDER — SUMATRIPTAN SUCCINATE 25 MG PO TABS
25.0000 mg | ORAL_TABLET | ORAL | Status: DC | PRN
  Filled 2018-06-02: qty 1

## 2018-06-02 MED ORDER — OXYCODONE HCL 5 MG PO TABS
5.0000 mg | ORAL_TABLET | Freq: Once | ORAL | Status: DC | PRN
  Filled 2018-06-02: qty 1

## 2018-06-02 MED ORDER — ROCURONIUM BROMIDE 10 MG/ML (PF) SYRINGE
PREFILLED_SYRINGE | INTRAVENOUS | Status: DC | PRN
  Administered 2018-06-02: 30 mg via INTRAVENOUS

## 2018-06-02 MED ORDER — TRAMADOL HCL 50 MG PO TABS: 50.0000 mg | ORAL_TABLET | Freq: Four times a day (QID) | ORAL | 0 refills | Status: DC | PRN

## 2018-06-02 MED ORDER — TRAMADOL HCL 50 MG PO TABS
ORAL_TABLET | ORAL | Status: AC
  Filled 2018-06-02: qty 1

## 2018-06-02 MED ORDER — ONDANSETRON HCL 4 MG/2ML IJ SOLN
INTRAMUSCULAR | Status: DC | PRN
  Administered 2018-06-02: 4 mg via INTRAVENOUS

## 2018-06-02 MED ORDER — PROMETHAZINE HCL 25 MG/ML IJ SOLN
6.2500 mg | INTRAMUSCULAR | Status: DC | PRN
  Filled 2018-06-02: qty 1

## 2018-06-02 MED ORDER — BUPIVACAINE HCL (PF) 0.25 % IJ SOLN
INTRAMUSCULAR | Status: DC | PRN
  Administered 2018-06-02: 10 mL

## 2018-06-02 MED ORDER — MIDAZOLAM HCL 2 MG/2ML IJ SOLN
INTRAMUSCULAR | Status: DC | PRN
  Administered 2018-06-02: 2 mg via INTRAVENOUS

## 2018-06-02 MED ORDER — EPHEDRINE SULFATE-NACL 50-0.9 MG/10ML-% IV SOSY
PREFILLED_SYRINGE | INTRAVENOUS | Status: DC | PRN
  Administered 2018-06-02: 10 mg via INTRAVENOUS
  Administered 2018-06-02 (×2): 5 mg via INTRAVENOUS

## 2018-06-02 MED ORDER — FAMOTIDINE IN NACL 20-0.9 MG/50ML-% IV SOLN
20.0000 mg | Freq: Two times a day (BID) | INTRAVENOUS | Status: DC
  Filled 2018-06-02 (×2): qty 50

## 2018-06-02 MED ORDER — KETOROLAC TROMETHAMINE 30 MG/ML IJ SOLN
30.0000 mg | Freq: Once | INTRAMUSCULAR | Status: DC
  Filled 2018-06-02: qty 1

## 2018-06-02 MED ORDER — SCOPOLAMINE 1 MG/3DAYS TD PT72
MEDICATED_PATCH | TRANSDERMAL | Status: AC
  Filled 2018-06-02: qty 1

## 2018-06-02 MED ORDER — ACETAMINOPHEN 325 MG PO TABS
650.0000 mg | ORAL_TABLET | ORAL | Status: DC | PRN
  Filled 2018-06-02: qty 2

## 2018-06-02 MED ORDER — LACTATED RINGERS IV SOLN
INTRAVENOUS | Status: DC
  Administered 2018-06-02: 06:00:00 via INTRAVENOUS
  Filled 2018-06-02: qty 1000

## 2018-06-02 MED ORDER — ENOXAPARIN SODIUM 40 MG/0.4ML ~~LOC~~ SOLN
SUBCUTANEOUS | Status: AC
  Filled 2018-06-02: qty 0.4

## 2018-06-02 MED ORDER — ONDANSETRON HCL 4 MG/2ML IJ SOLN
4.0000 mg | Freq: Four times a day (QID) | INTRAMUSCULAR | Status: DC | PRN
  Filled 2018-06-02: qty 2

## 2018-06-02 MED ORDER — DEXAMETHASONE SODIUM PHOSPHATE 10 MG/ML IJ SOLN
INTRAMUSCULAR | Status: DC | PRN
  Administered 2018-06-02: 10 mg via INTRAVENOUS

## 2018-06-02 MED ORDER — ACETAMINOPHEN 160 MG/5ML PO SOLN
960.0000 mg | Freq: Once | ORAL | Status: AC
  Filled 2018-06-02: qty 40.6

## 2018-06-02 MED ORDER — VASOPRESSIN 20 UNIT/ML IV SOLN
INTRAVENOUS | Status: DC | PRN
  Administered 2018-06-02: 1 [IU]

## 2018-06-02 MED ORDER — ROPIVACAINE HCL 5 MG/ML IJ SOLN
INTRAMUSCULAR | Status: DC | PRN
  Administered 2018-06-02: 60 mL via EPIDURAL

## 2018-06-02 MED ORDER — ENOXAPARIN SODIUM 40 MG/0.4ML ~~LOC~~ SOLN
40.0000 mg | SUBCUTANEOUS | Status: DC
  Filled 2018-06-02: qty 0.4

## 2018-06-02 MED ORDER — PROPOFOL 10 MG/ML IV BOLUS
INTRAVENOUS | Status: AC
  Filled 2018-06-02: qty 20

## 2018-06-02 MED ORDER — KCL IN DEXTROSE-NACL 20-5-0.45 MEQ/L-%-% IV SOLN
INTRAVENOUS | Status: DC
  Filled 2018-06-02 (×2): qty 1000

## 2018-06-02 MED ORDER — HYDROMORPHONE HCL 1 MG/ML IJ SOLN
0.2500 mg | INTRAMUSCULAR | Status: DC | PRN
  Filled 2018-06-02: qty 0.5

## 2018-06-02 MED ORDER — ALUM & MAG HYDROXIDE-SIMETH 200-200-20 MG/5ML PO SUSP
30.0000 mL | ORAL | Status: DC | PRN
  Filled 2018-06-02: qty 30

## 2018-06-02 MED ORDER — CLINDAMYCIN PHOSPHATE 900 MG/50ML IV SOLN
INTRAVENOUS | Status: DC | PRN
  Administered 2018-06-02: 900 mg via INTRAVENOUS

## 2018-06-02 MED ORDER — SENNOSIDES-DOCUSATE SODIUM 8.6-50 MG PO TABS
1.0000 | ORAL_TABLET | Freq: Every evening | ORAL | Status: DC | PRN
  Filled 2018-06-02: qty 1

## 2018-06-02 MED ORDER — ACETAMINOPHEN 325 MG PO TABS: 650.0000 mg | ORAL_TABLET | ORAL | 0 refills | Status: DC | PRN

## 2018-06-02 MED ORDER — ESCITALOPRAM OXALATE 10 MG PO TABS
10.0000 mg | ORAL_TABLET | Freq: Every day | ORAL | Status: DC
  Filled 2018-06-02: qty 1

## 2018-06-02 MED ORDER — GENTAMICIN SULFATE 40 MG/ML IJ SOLN
INTRAVENOUS | Status: AC
  Administered 2018-06-02: 380 mg via INTRAVENOUS
  Filled 2018-06-02 (×2): qty 9.5

## 2018-06-02 MED ORDER — ONDANSETRON HCL 4 MG PO TABS
4.0000 mg | ORAL_TABLET | Freq: Four times a day (QID) | ORAL | Status: DC | PRN
  Filled 2018-06-02: qty 1

## 2018-06-02 MED ORDER — ENOXAPARIN SODIUM 40 MG/0.4ML ~~LOC~~ SOLN
40.0000 mg | SUBCUTANEOUS | Status: AC
  Administered 2018-06-02: 40 mg via SUBCUTANEOUS
  Filled 2018-06-02: qty 0.4

## 2018-06-02 MED ORDER — OXYCODONE-ACETAMINOPHEN 5-325 MG PO TABS
2.0000 | ORAL_TABLET | ORAL | Status: DC | PRN
  Filled 2018-06-02: qty 2

## 2018-06-02 MED ORDER — FAMOTIDINE 20 MG PO TABS
ORAL_TABLET | ORAL | Status: AC
  Filled 2018-06-02: qty 1

## 2018-06-02 MED ORDER — OXYCODONE HCL 5 MG/5ML PO SOLN
5.0000 mg | Freq: Once | ORAL | Status: DC | PRN
  Filled 2018-06-02: qty 5

## 2018-06-02 MED ORDER — ZOLPIDEM TARTRATE 5 MG PO TABS
5.0000 mg | ORAL_TABLET | Freq: Every evening | ORAL | Status: DC | PRN
  Filled 2018-06-02: qty 1

## 2018-06-02 MED ORDER — FENTANYL CITRATE (PF) 250 MCG/5ML IJ SOLN
INTRAMUSCULAR | Status: DC | PRN
  Administered 2018-06-02: 75 ug via INTRAVENOUS
  Administered 2018-06-02: 50 ug via INTRAVENOUS
  Administered 2018-06-02: 75 ug via INTRAVENOUS
  Administered 2018-06-02: 50 ug via INTRAVENOUS

## 2018-06-02 MED ORDER — ACETAMINOPHEN 500 MG PO TABS
1000.0000 mg | ORAL_TABLET | Freq: Once | ORAL | Status: AC
  Administered 2018-06-02: 1000 mg via ORAL
  Filled 2018-06-02: qty 2

## 2018-06-02 MED ORDER — PROPOFOL 10 MG/ML IV BOLUS
INTRAVENOUS | Status: DC | PRN
  Administered 2018-06-02: 180 mg via INTRAVENOUS

## 2018-06-02 MED ORDER — SCOPOLAMINE 1 MG/3DAYS TD PT72
1.0000 | MEDICATED_PATCH | TRANSDERMAL | Status: DC
  Administered 2018-06-02: 1.5 mg via TRANSDERMAL
  Filled 2018-06-02: qty 1

## 2018-06-02 MED ORDER — MIDAZOLAM HCL 2 MG/2ML IJ SOLN
INTRAMUSCULAR | Status: AC
  Filled 2018-06-02: qty 2

## 2018-06-02 MED ORDER — KETOROLAC TROMETHAMINE 30 MG/ML IJ SOLN
30.0000 mg | Freq: Four times a day (QID) | INTRAMUSCULAR | Status: DC
  Filled 2018-06-02: qty 1

## 2018-06-02 MED ORDER — KETOROLAC TROMETHAMINE 30 MG/ML IJ SOLN
30.0000 mg | Freq: Four times a day (QID) | INTRAMUSCULAR | Status: DC
  Administered 2018-06-02: 30 mg via INTRAVENOUS
  Filled 2018-06-02: qty 1

## 2018-06-02 MED ORDER — LACTATED RINGERS IV SOLN
INTRAVENOUS | Status: DC
  Administered 2018-06-02 (×2): via INTRAVENOUS
  Filled 2018-06-02: qty 1000

## 2018-06-02 MED ORDER — TRAMADOL HCL 50 MG PO TABS
50.0000 mg | ORAL_TABLET | Freq: Four times a day (QID) | ORAL | Status: DC | PRN
  Administered 2018-06-02: 50 mg via ORAL
  Filled 2018-06-02: qty 1

## 2018-06-02 MED ORDER — DOCUSATE SODIUM 100 MG PO CAPS: 100.0000 mg | ORAL_CAPSULE | Freq: Two times a day (BID) | ORAL | 0 refills | Status: DC

## 2018-06-02 MED ORDER — ALBUTEROL SULFATE HFA 108 (90 BASE) MCG/ACT IN AERS
2.0000 | INHALATION_SPRAY | Freq: Four times a day (QID) | RESPIRATORY_TRACT | Status: DC | PRN
  Filled 2018-06-02: qty 6.7

## 2018-06-02 MED ORDER — FAMOTIDINE 20 MG PO TABS
20.0000 mg | ORAL_TABLET | Freq: Once | ORAL | Status: AC
  Administered 2018-06-02: 20 mg via ORAL
  Filled 2018-06-02: qty 1

## 2018-06-02 MED ORDER — DOCUSATE SODIUM 100 MG PO CAPS
100.0000 mg | ORAL_CAPSULE | Freq: Two times a day (BID) | ORAL | Status: DC
  Administered 2018-06-02: 100 mg via ORAL
  Filled 2018-06-02: qty 1

## 2018-06-02 MED ORDER — CELECOXIB 200 MG PO CAPS
200.0000 mg | ORAL_CAPSULE | Freq: Once | ORAL | Status: AC | PRN
  Administered 2018-06-02: 200 mg via ORAL
  Filled 2018-06-02: qty 1

## 2018-06-02 MED ORDER — SODIUM CHLORIDE 0.9 % IR SOLN
Status: DC | PRN
  Administered 2018-06-02: 1000 mL via INTRAVESICAL

## 2018-06-02 MED ORDER — SUGAMMADEX SODIUM 200 MG/2ML IV SOLN
INTRAVENOUS | Status: DC | PRN
  Administered 2018-06-02: 200 mg via INTRAVENOUS

## 2018-06-02 MED ORDER — ACETAMINOPHEN 500 MG PO TABS
ORAL_TABLET | ORAL | Status: AC
  Filled 2018-06-02: qty 2

## 2018-06-02 MED ORDER — HYDROMORPHONE HCL 1 MG/ML IJ SOLN
0.2000 mg | INTRAMUSCULAR | Status: DC | PRN
  Filled 2018-06-02: qty 1

## 2018-06-02 MED ORDER — IBUPROFEN 800 MG PO TABS: 800.0000 mg | ORAL_TABLET | Freq: Three times a day (TID) | ORAL | 1 refills | Status: DC | PRN

## 2018-06-02 MED ORDER — FENTANYL CITRATE (PF) 250 MCG/5ML IJ SOLN
INTRAMUSCULAR | Status: AC
  Filled 2018-06-02: qty 5

## 2018-06-02 MED ORDER — SUCCINYLCHOLINE CHLORIDE 200 MG/10ML IV SOSY
PREFILLED_SYRINGE | INTRAVENOUS | Status: DC | PRN
  Administered 2018-06-02: 120 mg via INTRAVENOUS

## 2018-06-02 MED ORDER — CELECOXIB 200 MG PO CAPS
ORAL_CAPSULE | ORAL | Status: AC
  Filled 2018-06-02: qty 1

## 2018-06-02 SURGICAL SUPPLY — 66 items
APPLICATOR ARISTA FLEXITIP XL (MISCELLANEOUS) ×4 IMPLANT
BLADE SURG 10 STRL SS (BLADE) IMPLANT
CABLE HIGH FREQUENCY MONO STRZ (ELECTRODE) IMPLANT
CANISTER SUCT 3000ML PPV (MISCELLANEOUS) ×4 IMPLANT
CELL SAVER LIPIGURD (MISCELLANEOUS) IMPLANT
COVER MAYO STAND STRL (DRAPES) ×4 IMPLANT
COVER SURGICAL LIGHT HANDLE (MISCELLANEOUS) ×4 IMPLANT
COVER TABLE BACK 60X90 (DRAPES) IMPLANT
DECANTER SPIKE VIAL GLASS SM (MISCELLANEOUS) ×12 IMPLANT
DERMABOND ADVANCED (GAUZE/BANDAGES/DRESSINGS) ×2
DERMABOND ADVANCED .7 DNX12 (GAUZE/BANDAGES/DRESSINGS) ×2 IMPLANT
DRSG COVADERM PLUS 2X2 (GAUZE/BANDAGES/DRESSINGS) ×12 IMPLANT
DRSG OPSITE POSTOP 3X4 (GAUZE/BANDAGES/DRESSINGS) ×4 IMPLANT
DURAPREP 26ML APPLICATOR (WOUND CARE) ×4 IMPLANT
EXTRT SYSTEM ALEXIS 14CM (MISCELLANEOUS)
EXTRT SYSTEM ALEXIS 17CM (MISCELLANEOUS)
GAUZE 4X4 16PLY RFD (DISPOSABLE) ×4 IMPLANT
GLOVE BIO SURGEON STRL SZ 6.5 (GLOVE) ×3 IMPLANT
GLOVE BIO SURGEONS STRL SZ 6.5 (GLOVE) ×1
GLOVE BIOGEL PI IND STRL 7.0 (GLOVE) ×8 IMPLANT
GLOVE BIOGEL PI INDICATOR 7.0 (GLOVE) ×8
GOWN STRL REUS W/TWL LRG LVL3 (GOWN DISPOSABLE) ×16 IMPLANT
HARMONIC RUM II 2.5CM SILVER (DISPOSABLE)
HARMONIC RUM II 3.0CM SILVER (DISPOSABLE)
HARMONIC RUM II 3.5CM SILVER (DISPOSABLE) ×4
HARMONIC RUM II 4.0CM SILVER (DISPOSABLE)
HEMOSTAT ARISTA ABSORB 3G PWDR (MISCELLANEOUS) ×4 IMPLANT
LIGASURE VESSEL 5MM BLUNT TIP (ELECTROSURGICAL) ×4 IMPLANT
NEEDLE INSUFFLATION 120MM (ENDOMECHANICALS) ×4 IMPLANT
PACK LAPAROSCOPY BASIN (CUSTOM PROCEDURE TRAY) ×4 IMPLANT
PACK TRENDGUARD 450 HYBRID PRO (MISCELLANEOUS) ×2 IMPLANT
POUCH LAPAROSCOPIC INSTRUMENT (MISCELLANEOUS) ×4 IMPLANT
PROTECTOR NERVE ULNAR (MISCELLANEOUS) ×8 IMPLANT
RETRACTOR WOUND ALXS 19CM XSML (INSTRUMENTS) IMPLANT
RTRCTR WOUND ALEXIS 19CM XSML (INSTRUMENTS)
SCALPEL HRMNC RUM II 2.5 SILVR (DISPOSABLE) IMPLANT
SCALPEL HRMNC RUM II 3.0 SILVR (DISPOSABLE) IMPLANT
SCALPEL HRMNC RUM II 3.5 SILVR (DISPOSABLE) ×2 IMPLANT
SCALPEL HRMNC RUM II 4.0 SILVR (DISPOSABLE) IMPLANT
SCISSORS LAP 5X35 DISP (ENDOMECHANICALS) IMPLANT
SET CYSTO W/LG BORE CLAMP LF (SET/KITS/TRAYS/PACK) ×4 IMPLANT
SET IRRIG TUBING LAPAROSCOPIC (IRRIGATION / IRRIGATOR) ×4 IMPLANT
SET TRI-LUMEN FLTR TB AIRSEAL (TUBING) ×4 IMPLANT
SHEARS HARMONIC ACE PLUS 36CM (ENDOMECHANICALS) ×4 IMPLANT
SUT VIC AB 0 CT1 36 (SUTURE) ×4 IMPLANT
SUT VIC AB 3-0 PS2 18 (SUTURE) ×2
SUT VIC AB 3-0 PS2 18XBRD (SUTURE) ×2 IMPLANT
SUT VICRYL 0 UR6 27IN ABS (SUTURE) ×4 IMPLANT
SUT VICRYL 4-0 PS2 18IN ABS (SUTURE) ×4 IMPLANT
SUT VLOC 180 0 9IN  GS21 (SUTURE)
SUT VLOC 180 0 9IN GS21 (SUTURE) IMPLANT
SYR 50ML LL SCALE MARK (SYRINGE) ×8 IMPLANT
SYSTEM CONTND EXTRCTN KII BLLN (MISCELLANEOUS) IMPLANT
TIP RUMI ORANGE 6.7MMX12CM (TIP) IMPLANT
TIP UTERINE 5.1X6CM LAV DISP (MISCELLANEOUS) IMPLANT
TIP UTERINE 6.7X10CM GRN DISP (MISCELLANEOUS) IMPLANT
TIP UTERINE 6.7X6CM WHT DISP (MISCELLANEOUS) IMPLANT
TIP UTERINE 6.7X8CM BLUE DISP (MISCELLANEOUS) ×4 IMPLANT
TOWEL OR 17X24 6PK STRL BLUE (TOWEL DISPOSABLE) ×8 IMPLANT
TRAY FOLEY W/BAG SLVR 14FR (SET/KITS/TRAYS/PACK) ×4 IMPLANT
TRENDGUARD 450 HYBRID PRO PACK (MISCELLANEOUS) ×4
TROCAR ADV FIXATION 5X100MM (TROCAR) ×4 IMPLANT
TROCAR PORT AIRSEAL 5X120 (TROCAR) ×4 IMPLANT
TROCAR XCEL NON BLADE 8MM B8LT (ENDOMECHANICALS) ×4 IMPLANT
TROCAR XCEL NON-BLD 5MMX100MML (ENDOMECHANICALS) ×4 IMPLANT
WARMER LAPAROSCOPE (MISCELLANEOUS) ×4 IMPLANT

## 2018-06-02 NOTE — Interval H&P Note (Signed)
History and Physical Interval Note:  06/02/2018 7:15 AM  Haley Mcdonald  has presented today for surgery, with the diagnosis of AUB, worsening dysmenorrhea, previous endometrial ablation  The various methods of treatment have been discussed with the patient and family. After consideration of risks, benefits and other options for treatment, the patient has consented to  Procedure(s) with comments: TOTAL LAPAROSCOPIC HYSTERECTOMY WITH SALPINGECTOMY (Bilateral) - 1.5 hours OR time per Dr Talbert Nan. Will need extended recovery bed. CYSTOSCOPY possible (N/A) - possible cysto as a surgical intervention .  The patient's history has been reviewed, patient examined, no change in status, stable for surgery.  I have reviewed the patient's chart and labs.  Questions were answered to the patient's satisfaction.    Addendum: patient with mildly elevated WBC of 11.1, patient reports always mildly elevated, no symptoms of infection. Pre-op blood work also shows pre-diabetes. The patient is aware and will f/u with Dr Juleen China.   Salvadore Dom

## 2018-06-02 NOTE — Transfer of Care (Signed)
Immediate Anesthesia Transfer of Care Note  Patient: Haley Mcdonald  Procedure(s) Performed: TOTAL LAPAROSCOPIC HYSTERECTOMY WITH SALPINGECTOMY (Bilateral Abdomen) CYSTOSCOPY (N/A Bladder)  Patient LocationPACU  Anesthesia Type:General  Level of Consciousness: awake and alert   Airway & Oxygen Therapy: Patient Spontanous Breathing and Patient connected to face mask oxygen  Post-op Assessment: Report given to RN and Post -op Vital signs reviewed and stable  Post vital signs: Reviewed and stable  Last Vitals:  Vitals Value Taken Time  BP 109/69 06/02/2018  9:52 AM  Temp    Pulse 75 06/02/2018  9:56 AM  Resp 15 06/02/2018  9:56 AM  SpO2 96 % 06/02/2018  9:56 AM  Vitals shown include unvalidated device data.  Last Pain:  Vitals:   06/02/18 0549  TempSrc:   PainSc: 0-No pain      Patients Stated Pain Goal: 8 (41/96/22 [id number])  Complications: No apparent anesthesia complications

## 2018-06-02 NOTE — Discharge Instructions (Signed)
DISCHARGE INSTRUCTIONS: Laparoscopic Hysterectomy/salpingectomies  The following instructions have been prepared to help you care for yourself upon your return home today.  Wound care:  Do not get the incision wet for the first 24 hours. The incision should be kept clean and dry.  The Bandage on your belly button may be removed the second day(Wednesday) after surgery.  Should the incision become sore, red, and swollen after the first week, check with your doctor. -All incisions are covered by surgical glue that will begin to come off on its own in about a week_DO NOT PULL IT OFF Personal hygiene:  Shower the day after your procedure.  Activity and limitations:  Do NOT drive or operate any equipment today.  Do NOT lift anything more than 15 pounds for 2-3 weeks after surgery.  Do NOT rest in bed all day.  Walking is encouraged. Walk each day, starting slowly with 5-minute walks 3 or 4 times a day. Slowly increase the length of your walks.  Walk up and down stairs slowly.  Do NOT do strenuous activities, such as golfing, playing tennis, bowling, running, biking, weight lifting, gardening, mowing, or vacuuming for 2-4 weeks. Ask your doctor when it is okay to start.  Diet: Eat a light meal as desired this evening. You may resume your usual diet tomorrow.  Return to work: This is dependent on the type of work you do. 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 If you are more active at work, please discuss this with your doctor.  What to expect after your surgery: You may have a slight burning sensation when you urinate on the first day. You may have a very small amount of blood in the urine. Expect to have a small amount of vaginal discharge/light bleeding for 1-2 weeks. It is not unusual to have abdominal soreness and bruising for up to 2 weeks. You may be tired and need more rest for about 1 week. You may experience shoulder pain for 24-72 hours. Lying  flat in bed may relieve it.  Call your doctor for any of the following:  Develop a fever of 100.4 or greater  Inability to urinate 6 hours after discharge from hospital  Severe pain not relieved by pain medications  Persistent of heavy bleeding at incision site  Redness or swelling around incision site after a week  Increasing nausea or vomiting   Post Anesthesia Home Care Instructions  Activity: Get plenty of rest for the remainder of the day. A responsible adult should stay with you for 24 hours following the procedure.  For the next 24 hours, DO NOT: -Drive a car -Paediatric nurse -Drink alcoholic beverages -Take any medication unless instructed by your physician -Make any legal decisions or sign important papers.  Meals: Start with liquid foods such as gelatin or soup. Progress to regular foods as tolerated. Avoid greasy, spicy, heavy foods. If nausea and/or vomiting occur, drink only clear liquids until the nausea and/or vomiting subsides. Call your physician if vomiting continues.  Special Instructions/Symptoms: Your throat may feel dry or sore from the anesthesia or the breathing tube placed in your throat during surgery. If this causes discomfort, gargle with warm salt water. The discomfort should disappear within 24 hours.  If you had a scopolamine patch placed behind your ear for the management of post- operative nausea and/or vomiting:  1. The medication in the patch is effective for 72 hours, after which it should be removed.  Wrap patch in a tissue and discard in the trash. Wash hands thoroughly with soap and water. 2. You may remove the patch earlier than 72 hours if you experience unpleasant side effects which may include dry mouth, dizziness or visual disturbances. 3. Avoid touching the patch. Wash your hands with soap and water after contact with the patch.

## 2018-06-02 NOTE — Telephone Encounter (Signed)
Patient did not return call to office, patient is s/p [facility] 06/02/18.  Routing to provider for final review. Encounter closed.

## 2018-06-02 NOTE — Anesthesia Procedure Notes (Signed)
Date/Time: 06/02/2018 9:42 AM Performed by: Cynda Familia, CRNA Oxygen Delivery Method: Simple face mask Airway Equipment and Method: Oral airway Placement Confirmation: positive ETCO2 and breath sounds checked- equal and bilateral Dental Injury: Teeth and Oropharynx as per pre-operative assessment

## 2018-06-02 NOTE — Anesthesia Postprocedure Evaluation (Signed)
Anesthesia Post Note  Patient: Haley Mcdonald  Procedure(s) Performed: TOTAL LAPAROSCOPIC HYSTERECTOMY WITH SALPINGECTOMY (Bilateral Abdomen) CYSTOSCOPY (N/A Bladder)     Patient location during evaluation: PACU Anesthesia Type: General Level of consciousness: awake and alert Pain management: pain level controlled Vital Signs Assessment: post-procedure vital signs reviewed and stable Respiratory status: spontaneous breathing, nonlabored ventilation, respiratory function stable and patient connected to nasal cannula oxygen Cardiovascular status: blood pressure returned to baseline and stable Postop Assessment: no apparent nausea or vomiting Anesthetic complications: no    Last Vitals:  Vitals:   06/02/18 1115 06/02/18 1206  BP: 104/72 104/72  Pulse: (!) 59 (!) 57  Resp: 16 16  Temp: (!) 36.4 C (!) 36.4 C  SpO2: 94% 99%    Last Pain:  Vitals:   06/02/18 1209  TempSrc:   PainSc: 4                  Ryan P Ellender

## 2018-06-02 NOTE — Anesthesia Procedure Notes (Signed)
Procedure Name: Intubation Date/Time: 06/02/2018 7:49 AM Performed by: Cynda Familia, CRNA Pre-anesthesia Checklist: Patient identified, Emergency Drugs available, Suction available and Patient being monitored Patient Re-evaluated:Patient Re-evaluated prior to induction Oxygen Delivery Method: Circle System Utilized Preoxygenation: Pre-oxygenation with 100% oxygen Induction Type: IV induction and Cricoid Pressure applied Ventilation: Mask ventilation without difficulty Laryngoscope Size: Miller and 2 Grade View: Grade I Tube type: Oral Number of attempts: 1 Airway Equipment and Method: Stylet Placement Confirmation: ETT inserted through vocal cords under direct vision,  positive ETCO2 and breath sounds checked- equal and bilateral Secured at: 21 cm Tube secured with: Tape Dental Injury: Teeth and Oropharynx as per pre-operative assessment  Comments: Smooth IV induction Ellender present-- intubation atraumatic teeth and mouth as preop-- bilat BS Ellender

## 2018-06-02 NOTE — Op Note (Signed)
Preoperative Diagnosis: Menometrorrhagia, severe dysmenorrhea, history of endometrial ablation  Postoperative Diagnosis: Menometrorrhagia, severe dysmenorrhea, history of endometrial ablation, severe adhesions   Procedure:  Total Laparoscopic Hysterectomy with bilateral salpingectomy, extensive lysis of adhesions and cystoscopy  Surgeon: Dr Sumner Boast  Assistant: Dr Josefa Half  Anesthesia: General  EBL: 20  Fluids: 1,800 cc LR  Urine output: 419 cc   Complications: none  Indications for surgery: The patient is a 37 year old female, who presented with menometrorrhagia, a severe dysmenorrhea. She has a h/o an endometrial ablation. She was not a candidate for OCP's or the mirena IUD. Work up included a normal CBC, high normal TSH, sonohysterogram with a scarred endometrial cavity and small endometrial polyp. Endometrial biopsy was negative in 7/19. Negative pap and hpv in 7/19.  The patient is aware of the risks and complications involved with the surgery and consent was obtained prior to the procedure.  Findings: EUA: normal sized uterus and no adnexal masses. Laparoscopy: Normal sized uterus. Uterus and omentum with scarring to the anterior abdominal wall. Left tube adherent to the left pelvic side wall, bladder scarred to the lower uterine segment. Normal ovaries bilaterally, evidence of bilateral tubal ligation, normal right tube.   Procedure: The patient was taken to the operating room with an IV in placed, preoperative antibiotics and lovenox had been administered. She was placed in the dorsal lithotomy position. General anesthesia was administered. She was prepped and draped in the usual sterile fashion for an abdominal, vaginal surgery. A rumi uterine manipulator was placed, using a # 3.5 cup and a 8 cm extender. A foley catheter was placed.    The umbilicus was everted, injected with 0.25% marcaine and incised with a # 11 blade. 2 towel clips were used to elevated the umbilicus  and a veress needle was placed into the abdominal cavity. The abdominal cavity was insufflated with CO2, with normal intraabdominal pressures. After adequate pneumo-insufflation the veress needle was removed and the 5 mm laparoscope was placed into the abdominal cavity using the opti-view trocar. The patient was placed in trendelenburg and the abdominal pelvic cavity was inspected. 2 more trocars were placed: 1 in each lower quadrant approximately 3 cm medial to and superior to the anterior superior iliac spine. These areas were injected with 0.25% marcaine, incised with a #11 blade and all trocars were inserted with direct visualization with the laparoscope. A # 5 airseal trocar was placed in the RLQ, a 5 mm trocar in the LLQ. The abdominal pelvic cavity was again inspected. A mixture of 30 cc of Robivacaine and 30 cc of NS was place in the pelvic cavity.   The adhesions of the uterus and omentum to the anterior abdominal wall were taken down with the harmonic scalpel. Once these adhesions were taken down the final trocar was placed in the midline approximately 6 cm above the pubic symphysis in the midline. The area was injected with local anesthesia, incised with a #11 blade and a #8 trocar was placed with direct visualization with the laparoscope.   The adhesions of the left tube to the pelvic side wall were taken down with the harmonic scalpel. The left tube was elevated from the pelvic sidewall, cauterized and cut with the ligasure device. The mesosalpinx was cauterized and cut with the ligasure device and the distal portion of the tube (where the tubal ligation was performed) was removed. The left round ligament was cauterized and cut with the ligasure device and the anterior and posterior leafs  of the broad ligament were taken down with the ligasure device. The harmonic scalpel was then used to take down the scar tissue adhering the bladder to the uterus and the bladder flap was created. The uterine  vessels were skeltonized. The left uterine vessels were then clamped, cauterized and ligated with the ligasure device. Hemostasis was excellent. The same procedure was repeated on the right, the only difference being that there were no adhesions of the right tube.   Using the rumi manipulator the uterus was pushed up in the pelvic cavity and the harmonic scalpel was used to separate the cervix from the vagina using the harmonic energy. The uterus was removed vaginally at this time. An occluder was placed in the vagina to maintain pneumoperitoneum. The vaginal cuff was then closed with a 0 V-lock suture. Hemostasis was excellent. The abdominal pelvic cavity was irrigated and suctioned dry. There was a small area of oozing anterior to the vaginal cuff that was cauterized.  Pressure was released and hemostasis remained excellent. Arista was placed in the pelvis.   The abdominal cavity was desufflated and the trocars were removed. The skin was closed with subcuticular stiches of 4-0 vicryl and dermabond was placed over the incisions.  The foley catheter was removed and cystoscopy was performed using a 70 degree scope. Both ureters expelled urine, no bladder abnormalities were noted. The bladder was allowed to drain and the cystoscope was removed.   The patient's abdomen and perineum were cleansed and she was taken out of the dorsal lithotomy position. Upon awakening she was extubated and taken to the recovery room in stable condition. The sponge and instrument counts were correct.

## 2018-06-02 NOTE — Discharge Summary (Signed)
Physician Discharge Summary   Patient ID: Haley Mcdonald [id number] 37 y.o. [birth year]  Admit date: 06/02/2018  Discharge date and time: 06/02/18  Admitting Physician: Salvadore Dom, MD   Discharge Physician: Salvadore Dom, MD  Admission Diagnoses: AUB, worsening dysmenorrhea, previous endometrial ablation  Discharge Diagnoses: status post total laparoscopic hysterectomy, lysis of adhesions, bilateral salpingectomies and cystoscopy  Admission Condition: good  Discharged Condition: good  Indication for Admission: surgery  Hospital Course: The patient underwent an uncomplicated surgery, no post operative complications.   Lab Results  Component Value Date   WBC 14.2 (H) 06/02/2018   HGB 13.8 06/02/2018   HCT 42.8 06/02/2018   MCV 89.0 06/02/2018   PLT 185 06/02/2018    No intake/output data recorded. Total I/O In: 2850 [P.O.:300; I.V.:2550] Out: 1670 [Urine:1650; Blood:20]   Today's Vitals   06/02/18 1206 06/02/18 1209 06/02/18 1600 06/02/18 1603  BP: 104/72   116/74  Pulse: (!) 57   61  Resp: 16   16  Temp: (!) 97.5 F (36.4 C)   97.8 F (36.6 C)  TempSrc: Oral   Oral  SpO2: 99%   100%  Weight:      Height:      PainSc:  4  0-No pain     Discharge Exam: BP 116/74 (BP Location: Left Arm)   Pulse 61   Temp 97.8 F (36.6 C) (Oral)   Resp 16   Ht 5\' 3"  (1.6 m)   Wt 76.1 kg   LMP 05/07/2018 (Exact Date)   SpO2 100%   BMI 29.71 kg/m    Heart: regular rate and rhythm Lungs: CTAB Abdomen: soft, non-tender; bowel sounds + Incisions: clean, dry and intact without erythema Extremities: normal, atraumatic, no cyanosis Skin: normal color, texture and turgor, no rashes or lesions     Disposition: Discharge disposition: 01-Home or Self Care      Patient Instructions:   Activity: no sex for 8-12 weeks and increase activity as tolerated. No driving while taking narcotics.  Diet: regular diet Wound Care: keep wound clean and  dry  Follow-up with Dr Talbert Nan in 1 week.  Signed: Salvadore Dom 06/02/2018 5:26 PM

## 2018-06-03 ENCOUNTER — Encounter (HOSPITAL_BASED_OUTPATIENT_CLINIC_OR_DEPARTMENT_OTHER): Payer: Self-pay | Admitting: Obstetrics and Gynecology

## 2018-06-04 ENCOUNTER — Telehealth: Payer: Self-pay | Admitting: Obstetrics and Gynecology

## 2018-06-04 NOTE — Telephone Encounter (Signed)
Called to check on the patient, she is doing well. Has only taken one tramadol since leaving the hospital 2 days ago. Pain has been controlled with ibuprofen.

## 2018-06-09 ENCOUNTER — Ambulatory Visit (INDEPENDENT_AMBULATORY_CARE_PROVIDER_SITE_OTHER): Admitting: Obstetrics and Gynecology

## 2018-06-09 ENCOUNTER — Encounter: Payer: Self-pay | Admitting: Obstetrics and Gynecology

## 2018-06-09 ENCOUNTER — Other Ambulatory Visit: Payer: Self-pay

## 2018-06-09 VITALS — BP 144/94 | HR 90 | Resp 14 | Ht 63.0 in | Wt 170.0 lb

## 2018-06-09 DIAGNOSIS — Z90710 Acquired absence of both cervix and uterus: Secondary | ICD-10-CM

## 2018-06-09 NOTE — Progress Notes (Signed)
GYNECOLOGY  VISIT   HPI: 37 y.o.   Married White or Caucasian Not Hispanic or Latino  female   [phone] with Patient's last menstrual period was 05/07/2018 (exact date).   here for 1 week post op. Patient is s/p TLH/BS/Extensive LOA and cystoscopy. Pathology was benign.  She feels fine, no pain, not taking any pain medication. Voiding normally. BM 2 x a day (better than normal). No vaginal bleeding.  Being at home is making her stir crazy. 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  GYNECOLOGIC HISTORY: Patient's last menstrual period was 05/07/2018 (exact date). Contraception: Hysterectomy Menopausal hormone therapy: None        OB History    Gravida  6   Para  3   Term  2   Preterm  1   AB  3   Living  3     SAB  3   TAB      Ectopic      Multiple      Live Births  3              Patient Active Problem List   Diagnosis Date Noted  . Status post laparoscopic hysterectomy 06/02/2018  . Pre-diabetes   . Genetic testing 03/21/2018  . Family history of breast cancer   . Family history of melanoma   . Family history of lymphoma   . Cervical dysplasia     Past Medical History:  Diagnosis Date  . ADD (attention deficit disorder) without hyperactivity   . Anxiety   . Asthma    as a child  . Cervical dysplasia   . Family history of breast cancer   . Family history of lymphoma   . Family history of melanoma   . Headache   . Migraine with aura   . PONV (postoperative nausea and vomiting)   . Pre-diabetes    gestational diabetes also    Past Surgical History:  Procedure Laterality Date  . CERVICAL BIOPSY  W/ LOOP ELECTRODE EXCISION    . CESAREAN SECTION     x 1  . CYSTOSCOPY N/A 06/02/2018   Procedure: CYSTOSCOPY;  Surgeon: Salvadore Dom, MD;  Location: [facility];  Service: Gynecology;  Laterality: N/A;  . DILITATION &  CURRETTAGE/HYSTROSCOPY WITH NOVASURE ABLATION N/A 04/01/2015   Procedure: DILATATION & CURETTAGE/HYSTEROSCOPY WITH NOVASURE ABLATION;  Surgeon: [name], MD;  Location: [facility];  Service: Gynecology;  Laterality: N/A;  . ENDOMETRIAL ABLATION    . TONSILLECTOMY    . TOTAL LAPAROSCOPIC HYSTERECTOMY WITH SALPINGECTOMY Bilateral 06/02/2018   Procedure: TOTAL LAPAROSCOPIC HYSTERECTOMY WITH SALPINGECTOMY;  Surgeon: Salvadore Dom, MD;  Location: [facility];  Service: Gynecology;  Laterality: Bilateral;  1.5 hours OR time per Dr Talbert Nan. Will need extended recovery bed.  . TUBAL LIGATION      Current Outpatient Medications  Medication Sig Dispense Refill  . acetaminophen (TYLENOL) 325 MG tablet Take 2 tablets (650 mg total) by mouth every 4 (four) hours as needed for mild pain. 30 tablet 0  . albuterol (PROVENTIL HFA;VENTOLIN HFA) 108 (90 Base) MCG/ACT inhaler Inhale 2 puffs into the lungs every 6 (six) hours as needed for wheezing or shortness of breath. 1 Inhaler 0  . docusate sodium (COLACE) 100 MG capsule Take 1 capsule (100 mg total) by mouth 2 (two) times daily. 30 capsule 0  .  escitalopram (LEXAPRO) 10 MG tablet TAKE 1 TABLET(10 MG) BY MOUTH DAILY 90 tablet 2  . Lisdexamfetamine Dimesylate (VYVANSE) 50 MG CHEW Chew 1 tablet by mouth daily. 30 tablet 0  . SUMAtriptan (IMITREX) 25 MG tablet Take 1 tablet (25 mg total) by mouth every 2 (two) hours as needed for migraine. May repeat in 2 hours if headache persists or recurs. 10 tablet 0  . ibuprofen (ADVIL,MOTRIN) 800 MG tablet Take 1 tablet (800 mg total) by mouth every 8 (eight) hours as needed. (Patient not taking: Reported on 06/09/2018) 30 tablet 1  . traMADol (ULTRAM) 50 MG tablet Take 1 tablet (50 mg total) by mouth every 6 (six) hours as needed for moderate pain. (Patient not taking: Reported on 06/09/2018) 20 tablet 0   No current facility-administered medications for this visit.      ALLERGIES: Penicillins  and Bupropion  Family History  Problem Relation Age of Onset  . Breast cancer Mother 79       "3x"  . Lung cancer Father   . Breast cancer Maternal Aunt 74  . Lymphoma Paternal Grandmother 38  . Colon cancer Maternal Grandfather 65  . Melanoma Paternal Grandfather 65  . Melanoma Cousin     Social History   Socioeconomic History  . Marital status: Married    Spouse name: Not on file  . Number of children: Not on file  . Years of education: Not on file  . Highest education level: Not on file  Occupational History  . Not on file  Social Needs  . Financial resource strain: Not on file  . Food insecurity:    Worry: Not on file    Inability: Not on file  . Transportation needs:    Medical: Not on file    Non-medical: Not on file  Tobacco Use  . Smoking status: Current Every Day Smoker    Packs/day: 0.50    Years: 10.00    Pack years: 5.00    Types: Cigarettes  . Smokeless tobacco: Never Used  Substance and Sexual Activity  . Alcohol use: Not Currently  . Drug use: No  . Sexual activity: Yes    Birth control/protection: Surgical    Comment: Hysterectomy  Lifestyle  . Physical activity:    Days per week: Not on file    Minutes per session: Not on file  . Stress: Not on file  Relationships  . Social connections:    Talks on phone: Not on file    Gets together: Not on file    Attends religious service: Not on file    Active member of club or organization: Not on file    Attends meetings of clubs or organizations: Not on file    Relationship status: Married  . Intimate partner violence:    Fear of current or ex partner: Not on file    Emotionally abused: Not on file    Physically abused: Not on file    Forced sexual activity: Not on file  Other Topics Concern  . Not on file  Social History Narrative  . Not on file    Review of Systems  Constitutional: Negative.   HENT: Negative.   Eyes: Negative.   Respiratory: Negative.   Cardiovascular: Negative.    Gastrointestinal: Negative.   Genitourinary: Negative.   Musculoskeletal: Negative.   Skin: Negative.   Neurological: Negative.   Endo/Heme/Allergies: Negative.   Psychiatric/Behavioral: Negative.     PHYSICAL EXAMINATION:    BP (!) 144/94 (BP Location:  Left Arm, Patient Position: Sitting, Cuff Size: Normal)   Pulse 90   Resp 14   Ht 5\' 3"  (1.6 m)   Wt 170 lb (77.1 kg)   LMP 05/07/2018 (Exact Date)   BMI 30.11 kg/m     General appearance: alert, cooperative and appears stated age Abdomen: soft, non-tender; non distended, no masses,  no organomegaly Incisions: healing well.   ASSESSMENT 1 week s/p TLH/LOA/BS/cystoscopy, doing great Slightly elevated BP, will recheck (patient states irritated at her ex) 134/88    PLAN She will try going back to work for a couple of hours at a time this week. If she is tolerating she will increase her hours next week as tolerated.  F/u in 3 weeks Call with any concerns.   An After Visit Summary was printed and given to the patient.

## 2018-06-11 ENCOUNTER — Ambulatory Visit (INDEPENDENT_AMBULATORY_CARE_PROVIDER_SITE_OTHER): Admitting: Family Medicine

## 2018-06-11 ENCOUNTER — Encounter: Payer: Self-pay | Admitting: Family Medicine

## 2018-06-11 VITALS — BP 124/86 | HR 86 | Temp 97.6°F | Ht 63.0 in | Wt 174.0 lb

## 2018-06-11 DIAGNOSIS — E8881 Metabolic syndrome: Secondary | ICD-10-CM

## 2018-06-11 DIAGNOSIS — E88819 Insulin resistance, unspecified: Secondary | ICD-10-CM

## 2018-06-11 DIAGNOSIS — F988 Other specified behavioral and emotional disorders with onset usually occurring in childhood and adolescence: Secondary | ICD-10-CM | POA: Diagnosis not present

## 2018-06-11 DIAGNOSIS — F17200 Nicotine dependence, unspecified, uncomplicated: Secondary | ICD-10-CM | POA: Diagnosis not present

## 2018-06-11 DIAGNOSIS — Z90710 Acquired absence of both cervix and uterus: Secondary | ICD-10-CM | POA: Diagnosis not present

## 2018-06-11 DIAGNOSIS — G43009 Migraine without aura, not intractable, without status migrainosus: Secondary | ICD-10-CM

## 2018-06-11 MED ORDER — LISDEXAMFETAMINE DIMESYLATE 50 MG PO CHEW: 1.0000 | CHEWABLE_TABLET | Freq: Every day | ORAL | 0 refills | Status: DC

## 2018-06-11 MED ORDER — METFORMIN HCL ER 500 MG PO TB24: 500.0000 mg | ORAL_TABLET | Freq: Every day | ORAL | 3 refills | Status: DC

## 2018-06-11 MED ORDER — AMPHETAMINE-DEXTROAMPHETAMINE 20 MG PO TABS: 20.0000 mg | ORAL_TABLET | Freq: Every day | ORAL | 0 refills | Status: DC

## 2018-06-11 NOTE — Progress Notes (Signed)
Haley Mcdonald is a 37 y.o. female is here for follow up.  History of Present Illness:   HPI: See Assessment and Plan section for Problem Based Charting of issues discussed today.  Health Maintenance Due  Topic Date Due  . TETANUS/TDAP  04/16/2000   Depression screen PHQ 2/9 12/16/2017  Decreased Interest 0  Down, Depressed, Hopeless 0  PHQ - 2 Score 0   PMHx, SurgHx, SocialHx, FamHx, Medications, and Allergies were reviewed in the Visit Navigator and updated as appropriate.   Patient Active Problem List   Diagnosis Date Noted  . Tobacco dependence 06/12/2018  . Status post laparoscopic hysterectomy 06/02/2018  . Insulin resistance   . Genetic testing 03/21/2018  . Family history of breast cancer   . Family history of melanoma   . Family history of lymphoma   . Cervical dysplasia   . 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 History of vitamin D deficiency 06/09/2014   Social History   Tobacco Use  . Smoking status: Current Every Day Smoker    Packs/day: 0.50    Years: 10.00    Pack years: 5.00    Types: Cigarettes  . Smokeless tobacco: Never Used  Substance Use Topics  . Alcohol use: Not Currently  . Drug use: No   Current Medications and Allergies:   .  albuterol (PROVENTIL HFA;VENTOLIN HFA) 108 (90 Base) MCG/ACT inhaler, Inhale 2 puffs into the lungs every 6 (six) hours as needed for wheezing or shortness of breath., Disp: 1 Inhaler, Rfl: 0 .  docusate sodium (COLACE) 100 MG capsule, Take 1 capsule (100 mg total) by mouth 2 (two) times daily., Disp: 30 capsule, Rfl: 0 .  escitalopram (LEXAPRO) 10 MG tablet, TAKE 1 TABLET(10 MG) BY MOUTH DAILY, Disp: 90 tablet, Rfl: 2 .  Lisdexamfetamine Dimesylate (VYVANSE) 50 MG CHEW, Chew 1 tablet by mouth daily., Disp: 30 tablet, Rfl: 0 .  SUMAtriptan (IMITREX) 25 MG tablet, Take 1 tablet (25 mg total) by mouth every 2 (two) hours as needed for migraine. May repeat in 2 hours if headache  persists or recurs., Disp: 10 tablet, Rfl: 0   Allergies  Allergen Reactions  . Penicillins Anaphylaxis, Shortness Of Breath and Other (See Comments)    Tachycardia  Has patient had a PCN reaction causing immediate rash, facial/tongue/throat swelling, SOB or lightheadedness with hypotension: Yes Has patient had a PCN reaction causing severe rash involving mucus membranes or skin necrosis: No Has patient had a PCN reaction that required hospitalization: No Has patient had a PCN reaction occurring within the last 10 years: Yes If all of the above answers are "NO", then may proceed with Cephalosporin use.   . Bupropion Rash    Blister   Review of Systems   Pertinent items are noted in the HPI. Otherwise, ROS is negative.  Vitals:   Vitals:   06/11/18 1447  BP: 124/86  Pulse: 86  Temp: 97.6 F (36.4 C)  SpO2: 98%  Weight: 174 lb (78.9 kg)  Height: 5\' 3"  (1.6 m)     Body mass index is 30.82 kg/m.  Physical Exam:   Physical Exam  Constitutional: She appears well-nourished.  HENT:  Head: Normocephalic and atraumatic.  Eyes: Pupils are equal, round, and reactive to light. EOM are normal.  Neck: Normal range of motion. Neck supple.  Cardiovascular: Normal rate, regular rhythm, normal heart sounds and intact distal pulses.  Pulmonary/Chest: Effort normal.  Abdominal: Soft.  Skin: Skin is warm.  Psychiatric:  She has a normal mood and affect. Her behavior is normal.  Nursing note and vitals reviewed.  Assessment and Plan:   Diagnoses and all orders for this visit:  ADD (attention deficit disorder) without hyperactivity -     amphetamine-dextroamphetamine (ADDERALL) 20 MG tablet; Take 1 tablet (20 mg total) by mouth daily after lunch. -     Lisdexamfetamine Dimesylate (VYVANSE) 50 MG CHEW; Chew 1 tablet by mouth daily.  Insulin resistance -     metFORMIN (GLUCOPHAGE-XR) 500 MG 24 hr tablet; Take 1 tablet (500 mg total) by mouth at bedtime.  Status post laparoscopic  hysterectomy  Tobacco dependence  Migraine without aura and without status migrainosus, not intractable -     SUMAtriptan (IMITREX) 25 MG tablet; Take 1 tablet (25 mg total) by mouth every 2 (two) hours as needed for migraine. May repeat in 2 hours if headache persists or recurs.   Insulin resistance Lab Results  Component Value Date   HGBA1C 5.8 (H) 05/29/2018   Usual eating pattern includes: irregular meal times and eating too late at night, drinks Dr. Malachi Bonds..   Usual physical activity includes limited dedicated exercise. Current life stressors: recent GYN surgery, husband out of town for work.  Wt Readings from Last 3 Encounters:  06/11/18 174 lb (78.9 kg)  06/09/18 170 lb (77.1 kg)  06/02/18 167 lb 11.2 oz (76.1 kg)   Plan: Will work on decreasing soda and late night meals.   Tobacco dependence I advised patient to quit smoking, and offered support. [place] QUITLINE: 1-800-QUIT-NOW [phone]). She is gradually weaning.   Status post laparoscopic hysterectomy Doing well.  ADD (attention deficit disorder) without hyperactivity Still improved with Vyvanse but crash mid afternoon. Will add low dose Adderall IR in afternoon for long days.   Meds ordered this encounter  Medications  . metFORMIN (GLUCOPHAGE-XR) 500 MG 24 hr tablet    Sig: Take 1 tablet (500 mg total) by mouth at bedtime.    Dispense:  90 tablet    Refill:  3  . amphetamine-dextroamphetamine (ADDERALL) 20 MG tablet    Sig: Take 1 tablet (20 mg total) by mouth daily after lunch.    Dispense:  30 tablet    Refill:  0  . Lisdexamfetamine Dimesylate (VYVANSE) 50 MG CHEW    Sig: Chew 1 tablet by mouth daily.    Dispense:  30 tablet    Refill:  0  . SUMAtriptan (IMITREX) 25 MG tablet    Sig: Take 1 tablet (25 mg total) by mouth every 2 (two) hours as needed for migraine. May repeat in 2 hours if headache persists or recurs.    Dispense:  10 tablet    Refill:  0   . Reviewed expectations re: course of  current medical issues. . Discussed self-management of symptoms. . Outlined signs and symptoms indicating need for more acute intervention. . Patient verbalized understanding and all questions were answered. Marland Kitchen Health Maintenance issues including appropriate healthy diet, exercise, and smoking avoidance were discussed with patient. . See orders for this visit as documented in the electronic medical record. . Patient received an After Visit Summary.  CMA served as Education administrator during this visit. History, Physical, and Plan performed by medical provider. The above documentation has been reviewed and is accurate and complete. Briscoe Deutscher, D.O.  Briscoe Deutscher, DO [place], [facility] 06/12/2018

## 2018-06-12 ENCOUNTER — Encounter: Payer: Self-pay | Admitting: Family Medicine

## 2018-06-12 DIAGNOSIS — F17200 Nicotine dependence, unspecified, uncomplicated: Secondary | ICD-10-CM | POA: Insufficient documentation

## 2018-06-12 MED ORDER — SUMATRIPTAN SUCCINATE 25 MG PO TABS: 25.0000 mg | ORAL_TABLET | ORAL | 0 refills | Status: DC | PRN

## 2018-06-12 NOTE — Assessment & Plan Note (Signed)
Lab Results  Component Value Date   HGBA1C 5.8 (H) 05/29/2018   Usual eating pattern includes: irregular meal times and eating too late at night, drinks Dr. Malachi Bonds..   Usual physical activity includes limited dedicated exercise. Current life stressors: recent GYN surgery, husband out of town for work.  Wt Readings from Last 3 Encounters:  06/11/18 174 lb (78.9 kg)  06/09/18 170 lb (77.1 kg)  06/02/18 167 lb 11.2 oz (76.1 kg)   Plan: Will work on decreasing soda and late night meals.

## 2018-06-12 NOTE — Assessment & Plan Note (Signed)
Doing well 

## 2018-06-12 NOTE — Assessment & Plan Note (Signed)
I advised patient to quit smoking, and offered support. [place] QUITLINE: 1-800-QUIT-NOW [phone]). She is gradually weaning.

## 2018-06-12 NOTE — Assessment & Plan Note (Signed)
Still improved with Vyvanse but crash mid afternoon. Will add low dose Adderall IR in afternoon for long days.

## 2018-06-26 NOTE — Progress Notes (Signed)
GYNECOLOGY  VISIT   HPI: 37 y.o.   Married White or Caucasian Not Hispanic or Latino  female   [phone] with Patient's last menstrual period was 05/07/2018 (exact date).   here for 4 week post op. Patient is s/p TLH/BS/Extensive LOA and cystoscopy. Pathology was benign.  She is doing well, no c/o. Normal bowel and bladder function. No vaginal bleeding.   GYNECOLOGIC HISTORY: Patient's last menstrual period was 05/07/2018 (exact date). Contraception: Hysterectomy  Menopausal hormone therapy: None        OB History    Gravida  6   Para  3   Term  2   Preterm  1   AB  3   Living  3     SAB  3   TAB      Ectopic      Multiple      Live Births  3              Patient Active Problem List   Diagnosis Date Noted  . Tobacco dependence 06/12/2018  . Status post laparoscopic hysterectomy 06/02/2018  . Insulin resistance   . Genetic testing 03/21/2018  . Family history of breast cancer   . Family history of melanoma   . Family history of lymphoma   . Cervical dysplasia   . 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 History of vitamin D deficiency 06/09/2014    Past Medical History:  Diagnosis Date  . ADD (attention deficit disorder) without hyperactivity   . Anxiety   . Asthma    as a child  . Cervical dysplasia   . Family history of breast cancer   . Family history of lymphoma   . Family history of melanoma   . Headache   . Migraine with aura   . PONV (postoperative nausea and vomiting)   . Pre-diabetes    gestational diabetes also    Past Surgical History:  Procedure Laterality Date  . CERVICAL BIOPSY  W/ LOOP ELECTRODE EXCISION    . CESAREAN SECTION     x 1  . CYSTOSCOPY N/A 06/02/2018   Procedure: CYSTOSCOPY;  Surgeon: Salvadore Dom, MD;  Location: [facility];  Service: Gynecology;  Laterality: N/A;  . DILITATION & CURRETTAGE/HYSTROSCOPY WITH NOVASURE ABLATION N/A 04/01/2015   Procedure: DILATATION  & CURETTAGE/HYSTEROSCOPY WITH NOVASURE ABLATION;  Surgeon: [name], MD;  Location: [facility];  Service: Gynecology;  Laterality: N/A;  . ENDOMETRIAL ABLATION    . TONSILLECTOMY    . TOTAL LAPAROSCOPIC HYSTERECTOMY WITH SALPINGECTOMY Bilateral 06/02/2018   Procedure: TOTAL LAPAROSCOPIC HYSTERECTOMY WITH SALPINGECTOMY;  Surgeon: Salvadore Dom, MD;  Location: [facility];  Service: Gynecology;  Laterality: Bilateral;  1.5 hours OR time per Dr Talbert Nan. Will need extended recovery bed.  . TUBAL LIGATION      Current Outpatient Medications  Medication Sig Dispense Refill  . acetaminophen (TYLENOL) 325 MG tablet Take 2 tablets (650 mg total) by mouth every 4 (four) hours as needed for mild pain. 30 tablet 0  . albuterol (PROVENTIL HFA;VENTOLIN HFA) 108 (90 Base) MCG/ACT inhaler Inhale 2 puffs into the lungs every 6 (six) hours as needed for wheezing or shortness of breath. 1 Inhaler 0  . amphetamine-dextroamphetamine (ADDERALL) 20 MG tablet Take 1 tablet (20 mg total) by mouth daily after lunch. 30 tablet 0  . docusate sodium (COLACE) 100 MG capsule Take 1 capsule (100 mg total) by mouth 2 (two) times daily. 30 capsule 0  .  escitalopram (LEXAPRO) 10 MG tablet TAKE 1 TABLET(10 MG) BY MOUTH DAILY 90 tablet 2  . Lisdexamfetamine Dimesylate (VYVANSE) 50 MG CHEW Chew 1 tablet by mouth daily. 30 tablet 0  . Lisdexamfetamine Dimesylate (VYVANSE) 50 MG CHEW Chew 1 tablet by mouth daily. 30 tablet 0  . metFORMIN (GLUCOPHAGE-XR) 500 MG 24 hr tablet Take 1 tablet (500 mg total) by mouth at bedtime. 90 tablet 3  . SUMAtriptan (IMITREX) 25 MG tablet Take 1 tablet (25 mg total) by mouth every 2 (two) hours as needed for migraine. May repeat in 2 hours if headache persists or recurs. 10 tablet 0   No current facility-administered medications for this visit.      ALLERGIES: Penicillins and Bupropion  Family History  Problem Relation Age of Onset  . Breast cancer Mother 79       "3x"   . Lung cancer Father   . Breast cancer Maternal Aunt 46  . Lymphoma Paternal Grandmother 64  . Colon cancer Maternal Grandfather 53  . Melanoma Paternal Grandfather 55  . Melanoma Cousin     Social History   Socioeconomic History  . Marital status: Married    Spouse name: Not on file  . Number of children: Not on file  . Years of education: Not on file  . Highest education level: Not on file  Occupational History  . Not on file  Social Needs  . Financial resource strain: Not on file  . Food insecurity:    Worry: Not on file    Inability: Not on file  . Transportation needs:    Medical: Not on file    Non-medical: Not on file  Tobacco Use  . Smoking status: Current Every Day Smoker    Packs/day: 0.50    Years: 10.00    Pack years: 5.00    Types: Cigarettes  . Smokeless tobacco: Never Used  Substance and Sexual Activity  . Alcohol use: Not Currently  . Drug use: No  . Sexual activity: Yes    Birth control/protection: Surgical    Comment: Hysterectomy  Lifestyle  . Physical activity:    Days per week: Not on file    Minutes per session: Not on file  . Stress: Not on file  Relationships  . Social connections:    Talks on phone: Not on file    Gets together: Not on file    Attends religious service: Not on file    Active member of club or organization: Not on file    Attends meetings of clubs or organizations: Not on file    Relationship status: Married  . Intimate partner violence:    Fear of current or ex partner: Not on file    Emotionally abused: Not on file    Physically abused: Not on file    Forced sexual activity: Not on file  Other Topics Concern  . Not on file  Social History Narrative  . Not on file    Review of Systems  Constitutional: Negative.   HENT: Negative.   Eyes: Negative.   Respiratory: Negative.   Cardiovascular: Negative.   Gastrointestinal: Negative.   Genitourinary: Negative.   Musculoskeletal: Negative.   Skin: Negative.    Neurological: Negative.   Endo/Heme/Allergies: Negative.   Psychiatric/Behavioral: Negative.     PHYSICAL EXAMINATION:    BP 108/84 (BP Location: Right Arm, Patient Position: Sitting, Cuff Size: Normal)   Pulse 86   Wt 170 lb (77.1 kg)   LMP 05/07/2018 (Exact Date)  BMI 30.11 kg/m     General appearance: alert, cooperative and appears stated age Abdomen: soft, non-tender; non distended, no masses,  no organomegaly Incisions: well healed  Pelvic: External genitalia:  no lesions              Urethra:  normal appearing urethra with no masses, tenderness or lesions              Bartholins and Skenes: normal                 Vagina: normal appearing vagina with normal color and discharge, no lesions  Vaginal cuff: healing well, not tender, not indurated.               Cervix: absent              Bimanual Exam:  Uterus:  uterus absent              Adnexa: no mass, fullness, tenderness               Chaperone was present for exam.  ASSESSMENT 4 weeks s/p TLH/BS    PLAN Routine f/u   An After Visit Summary was printed and given to the patient.

## 2018-06-30 ENCOUNTER — Other Ambulatory Visit: Payer: Self-pay

## 2018-06-30 ENCOUNTER — Ambulatory Visit (INDEPENDENT_AMBULATORY_CARE_PROVIDER_SITE_OTHER): Admitting: Obstetrics and Gynecology

## 2018-06-30 ENCOUNTER — Encounter: Payer: Self-pay | Admitting: Obstetrics and Gynecology

## 2018-06-30 VITALS — BP 108/84 | HR 86 | Wt 170.0 lb

## 2018-06-30 DIAGNOSIS — Z90710 Acquired absence of both cervix and uterus: Secondary | ICD-10-CM

## 2018-07-15 ENCOUNTER — Other Ambulatory Visit: Payer: Self-pay | Admitting: Physician Assistant

## 2018-07-15 DIAGNOSIS — F988 Other specified behavioral and emotional disorders with onset usually occurring in childhood and adolescence: Secondary | ICD-10-CM

## 2018-07-15 MED ORDER — AMPHETAMINE-DEXTROAMPHETAMINE 20 MG PO TABS: 20.0000 mg | ORAL_TABLET | Freq: Every day | ORAL | 0 refills | Status: DC

## 2018-07-15 MED ORDER — LISDEXAMFETAMINE DIMESYLATE 50 MG PO CHEW: 1.0000 | CHEWABLE_TABLET | Freq: Every day | ORAL | 0 refills | Status: DC

## 2018-07-23 ENCOUNTER — Other Ambulatory Visit: Payer: Self-pay | Admitting: Family Medicine

## 2018-07-23 MED ORDER — TRETINOIN 0.025 % EX CREA: TOPICAL_CREAM | Freq: Every day | CUTANEOUS | 3 refills | Status: DC

## 2018-08-06 ENCOUNTER — Other Ambulatory Visit: Payer: Self-pay | Admitting: Family Medicine

## 2018-08-06 MED ORDER — LISDEXAMFETAMINE DIMESYLATE 70 MG PO CAPS: 70.0000 mg | ORAL_CAPSULE | Freq: Every day | ORAL | 0 refills | Status: DC

## 2018-08-06 NOTE — Progress Notes (Signed)
Medication change today.

## 2018-08-15 ENCOUNTER — Other Ambulatory Visit: Payer: Self-pay | Admitting: Family Medicine

## 2018-08-15 ENCOUNTER — Ambulatory Visit (INDEPENDENT_AMBULATORY_CARE_PROVIDER_SITE_OTHER)

## 2018-08-15 ENCOUNTER — Other Ambulatory Visit (INDEPENDENT_AMBULATORY_CARE_PROVIDER_SITE_OTHER)

## 2018-08-15 DIAGNOSIS — R1012 Left upper quadrant pain: Secondary | ICD-10-CM

## 2018-08-15 LAB — COMPREHENSIVE METABOLIC PANEL
ALT: 17 U/L (ref 0–35)
AST: 12 U/L (ref 0–37)
Albumin: 4.6 g/dL (ref 3.5–5.2)
Alkaline Phosphatase: 78 U/L (ref 39–117)
BUN: 10 mg/dL (ref 6–23)
CO2: 23 mEq/L (ref 19–32)
Calcium: 9.1 mg/dL (ref 8.4–10.5)
Chloride: 107 mEq/L (ref 96–112)
Creatinine, Ser: 0.72 mg/dL (ref 0.40–1.20)
GFR: 96.7 mL/min (ref >60.00)
Glucose, Bld: 105 mg/dL — ABNORMAL HIGH (ref 70–99)
Potassium: 4.1 mEq/L (ref 3.5–5.1)
Sodium: 138 mEq/L (ref 135–145)
Total Bilirubin: 0.3 mg/dL (ref 0.2–1.2)
Total Protein: 6.5 g/dL (ref 6.0–8.3)

## 2018-08-15 LAB — CBC WITH DIFFERENTIAL/PLATELET
Basophils Absolute: 0 10*3/uL (ref 0.0–0.1)
Basophils Relative: 0.5 % (ref 0.0–3.0)
Eosinophils Absolute: 0.3 10*3/uL (ref 0.0–0.7)
Eosinophils Relative: 3.1 % (ref 0.0–5.0)
HCT: 45.3 % (ref 36.0–46.0)
Hemoglobin: 15.4 g/dL — ABNORMAL HIGH (ref 12.0–15.0)
Lymphocytes Relative: 33.1 % (ref 12.0–46.0)
Lymphs Abs: 2.8 10*3/uL (ref 0.7–4.0)
MCHC: 33.9 g/dL (ref 30.0–36.0)
MCV: 87.6 fl (ref 78.0–100.0)
Monocytes Absolute: 0.5 10*3/uL (ref 0.1–1.0)
Monocytes Relative: 5.9 % (ref 3.0–12.0)
Neutro Abs: 4.9 10*3/uL (ref 1.4–7.7)
Neutrophils Relative %: 57.4 % (ref 43.0–77.0)
Platelets: 203 10*3/uL (ref 150.0–400.0)
RBC: 5.17 Mil/uL — ABNORMAL HIGH (ref 3.87–5.11)
RDW: 13.5 % (ref 11.5–15.5)
WBC: 8.5 10*3/uL (ref 4.0–10.5)

## 2018-08-15 LAB — LIPASE: Lipase: 18 U/L (ref 11.0–59.0)

## 2018-09-08 ENCOUNTER — Other Ambulatory Visit: Payer: Self-pay | Admitting: Family Medicine

## 2018-09-08 MED ORDER — LISDEXAMFETAMINE DIMESYLATE 70 MG PO CAPS: 70.0000 mg | ORAL_CAPSULE | Freq: Every day | ORAL | 0 refills | Status: DC

## 2018-10-01 ENCOUNTER — Ambulatory Visit (INDEPENDENT_AMBULATORY_CARE_PROVIDER_SITE_OTHER): Admitting: Family Medicine

## 2018-10-01 ENCOUNTER — Encounter: Payer: Self-pay | Admitting: Family Medicine

## 2018-10-01 VITALS — BP 122/90 | HR 105 | Temp 97.7°F | Ht 63.0 in | Wt 166.2 lb

## 2018-10-01 DIAGNOSIS — R05 Cough: Secondary | ICD-10-CM

## 2018-10-01 DIAGNOSIS — R059 Cough, unspecified: Secondary | ICD-10-CM

## 2018-10-01 MED ORDER — PREDNISONE 5 MG PO TABS: ORAL_TABLET | ORAL | 0 refills | Status: DC

## 2018-10-01 MED ORDER — AZITHROMYCIN 250 MG PO TABS: ORAL_TABLET | ORAL | 0 refills | Status: DC

## 2018-10-01 MED ORDER — BUDESONIDE-FORMOTEROL FUMARATE 160-4.5 MCG/ACT IN AERO: 2.0000 | INHALATION_SPRAY | Freq: Two times a day (BID) | RESPIRATORY_TRACT | 3 refills | Status: DC

## 2018-10-01 MED ORDER — HYDROCOD POLST-CPM POLST ER 10-8 MG/5ML PO SUER: 5.0000 mL | Freq: Every evening | ORAL | 0 refills | Status: DC | PRN

## 2018-10-01 NOTE — Progress Notes (Signed)
Tahjanae Dhana Totton is a 38 y.o. female here for an acute visit.  History of Present Illness:   Cough  This is a new problem. The current episode started in the past 7 days. The problem has been gradually worsening. The cough is non-productive. Associated symptoms include chills, ear congestion, nasal congestion, rhinorrhea and wheezing. The symptoms are aggravated by lying down and stress. Risk factors for lung disease include smoking/tobacco exposure. She has tried a beta-agonist inhaler for the symptoms. The treatment provided mild relief.   PMHx, SurgHx, SocialHx, Medications, and Allergies were reviewed in the Visit Navigator and updated as appropriate.  Current Medications   .  acetaminophen (TYLENOL) 325 MG tablet, Take 2 tablets (650 mg total) by mouth every 4 (four) hours as needed for mild pain., Disp: 30 tablet, Rfl: 0 .  albuterol (PROVENTIL HFA;VENTOLIN HFA) 108 (90 Base) MCG/ACT inhaler, Inhale 2 puffs into the lungs every 6 (six) hours as needed for wheezing or shortness of breath., Disp: 1 Inhaler, Rfl: 0 .  amphetamine-dextroamphetamine (ADDERALL) 20 MG tablet, Take 1 tablet (20 mg total) by mouth daily after lunch., Disp: 30 tablet, Rfl: 0 .  azithromycin (ZITHROMAX) 250 MG tablet, 2 po on day one, then one po daily until gone., Disp: 6 tablet, Rfl: 0 .  budesonide-formoterol (SYMBICORT) 160-4.5 MCG/ACT inhaler, Inhale 2 puffs into the lungs 2 (two) times daily., Disp: 1 Inhaler, Rfl: 3 .  chlorpheniramine-HYDROcodone (TUSSIONEX PENNKINETIC ER) 10-8 MG/5ML SUER, Take 5 mLs by mouth at bedtime as needed for cough., Disp: 120 mL, Rfl: 0 .  docusate sodium (COLACE) 100 MG capsule, Take 1 capsule (100 mg total) by mouth 2 (two) times daily., Disp: 30 capsule, Rfl: 0 .  escitalopram (LEXAPRO) 10 MG tablet, TAKE 1 TABLET(10 MG) BY MOUTH DAILY, Disp: 90 tablet, Rfl: 2 .  lisdexamfetamine (VYVANSE) 70 MG capsule, Take 1 capsule (70 mg total) by mouth daily before breakfast for 30  days., Disp: 30 capsule, Rfl: 0 .  metFORMIN (GLUCOPHAGE-XR) 500 MG 24 hr tablet, Take 1 tablet (500 mg total) by mouth at bedtime., Disp: 90 tablet, Rfl: 3 .  predniSONE (DELTASONE) 5 MG tablet, 6-5-4-3-2-1-off, Disp: 21 tablet, Rfl: 0 .  SUMAtriptan (IMITREX) 25 MG tablet, Take 1 tablet (25 mg total) by mouth every 2 (two) hours as needed for migraine. May repeat in 2 hours if headache persists or recurs., Disp: 10 tablet, Rfl: 0 Allergies  Allergen Reactions  . Penicillins Anaphylaxis, Shortness Of Breath and Other (See Comments)    Tachycardia  Has patient had a PCN reaction causing immediate rash, facial/tongue/throat swelling, SOB or lightheadedness with hypotension: Yes Has patient had a PCN reaction causing severe rash involving mucus membranes or skin necrosis: No Has patient had a PCN reaction that required hospitalization: No Has patient had a PCN reaction occurring within the last 10 years: Yes If all of the above answers are "NO", then may proceed with Cephalosporin use.   . Bupropion Rash    Blister   Review of Systems   Pertinent items are noted in the HPI. Otherwise, ROS is negative.  Vitals   Vitals:   10/01/18 0827  BP: 122/90  Pulse: (!) 105  Temp: 97.7 F (36.5 C)  TempSrc: Oral  SpO2: 97%  Weight: 166 lb 4 oz (75.4 kg)  Height: 5\' 3"  (1.6 m)     Body mass index is 29.45 kg/m.  Physical Exam   Physical Exam Vitals signs and nursing note reviewed.  HENT:  Head: Normocephalic and atraumatic.  Eyes:     Pupils: Pupils are equal, round, and reactive to light.  Neck:     Musculoskeletal: Normal range of motion and neck supple.  Cardiovascular:     Rate and Rhythm: Normal rate and regular rhythm.     Heart sounds: Normal heart sounds.  Pulmonary:     Effort: Pulmonary effort is normal. No respiratory distress.  Abdominal:     Palpations: Abdomen is soft.  Skin:    General: Skin is warm.  Psychiatric:        Behavior: Behavior normal.     Assessment and Plan   Demi was seen today for cough.  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  Orders: -     azithromycin (ZITHROMAX) 250 MG tablet; 2 po on day one, then one po daily until gone. -     predniSONE (DELTASONE) 5 MG tablet; 6-5-4-3-2-1-off -     budesonide-formoterol (SYMBICORT) 160-4.5 MCG/ACT inhaler; Inhale 2 puffs into the lungs 2 (two) times daily. -     chlorpheniramine-HYDROcodone (TUSSIONEX PENNKINETIC ER) 10-8 MG/5ML SUER; Take 5 mLs by mouth at bedtime as needed for cough.   . Reviewed expectations re: course of current medical issues. . Discussed self-management of symptoms. . Outlined signs and symptoms indicating need for more acute intervention. . Patient verbalized understanding and all questions were answered. Marland Kitchen Health Maintenance issues including appropriate healthy diet, exercise, and smoking avoidance were discussed with patient. . See orders for this visit as documented in the electronic medical record. . Patient received an After Visit Summary.  Briscoe Deutscher, DO [place] 10/01/2018

## 2018-10-06 NOTE — Progress Notes (Signed)
Per CoverMyMeds, received a denial for [place] coverage of Symbicort.  Pt notified of outcome.

## 2018-10-07 ENCOUNTER — Other Ambulatory Visit: Payer: Self-pay | Admitting: Family Medicine

## 2018-10-07 MED ORDER — DIAZEPAM 5 MG PO TABS: 5.0000 mg | ORAL_TABLET | Freq: Two times a day (BID) | ORAL | 1 refills | Status: DC | PRN

## 2018-10-07 MED ORDER — TRAZODONE HCL 100 MG PO TABS: 100.0000 mg | ORAL_TABLET | Freq: Every evening | ORAL | 3 refills | Status: DC | PRN

## 2018-11-03 ENCOUNTER — Other Ambulatory Visit: Payer: Self-pay | Admitting: Family Medicine

## 2018-11-03 DIAGNOSIS — R059 Cough, unspecified: Secondary | ICD-10-CM

## 2018-11-03 DIAGNOSIS — R05 Cough: Secondary | ICD-10-CM

## 2018-11-03 MED ORDER — ALBUTEROL SULFATE HFA 108 (90 BASE) MCG/ACT IN AERS: 2.0000 | INHALATION_SPRAY | Freq: Four times a day (QID) | RESPIRATORY_TRACT | 0 refills | Status: DC | PRN

## 2018-11-03 MED ORDER — OXYCODONE-ACETAMINOPHEN 10-325 MG PO TABS: 1.0000 | ORAL_TABLET | Freq: Three times a day (TID) | ORAL | 0 refills | Status: DC | PRN

## 2018-11-03 MED ORDER — BUDESONIDE-FORMOTEROL FUMARATE 160-4.5 MCG/ACT IN AERO: 2.0000 | INHALATION_SPRAY | Freq: Two times a day (BID) | RESPIRATORY_TRACT | 3 refills | Status: DC

## 2018-11-03 MED ORDER — CLINDAMYCIN HCL 300 MG PO CAPS: 300.0000 mg | ORAL_CAPSULE | Freq: Three times a day (TID) | ORAL | 0 refills | Status: DC

## 2018-11-05 ENCOUNTER — Encounter: Payer: Self-pay | Admitting: Physician Assistant

## 2018-11-10 ENCOUNTER — Encounter: Payer: Self-pay | Admitting: Family Medicine

## 2018-11-10 ENCOUNTER — Ambulatory Visit (INDEPENDENT_AMBULATORY_CARE_PROVIDER_SITE_OTHER): Admitting: Family Medicine

## 2018-11-10 DIAGNOSIS — F17200 Nicotine dependence, unspecified, uncomplicated: Secondary | ICD-10-CM

## 2018-11-10 DIAGNOSIS — F988 Other specified behavioral and emotional disorders with onset usually occurring in childhood and adolescence: Secondary | ICD-10-CM

## 2018-11-10 DIAGNOSIS — F418 Other specified anxiety disorders: Secondary | ICD-10-CM

## 2018-11-10 DIAGNOSIS — E8881 Metabolic syndrome: Secondary | ICD-10-CM

## 2018-11-10 DIAGNOSIS — E88819 Insulin resistance, unspecified: Secondary | ICD-10-CM

## 2018-11-10 MED ORDER — LISDEXAMFETAMINE DIMESYLATE 70 MG PO CAPS: 70.0000 mg | ORAL_CAPSULE | Freq: Every day | ORAL | 0 refills | Status: DC

## 2018-11-10 MED ORDER — DIAZEPAM 5 MG PO TABS: 5.0000 mg | ORAL_TABLET | Freq: Two times a day (BID) | ORAL | 1 refills | Status: DC | PRN

## 2018-11-10 NOTE — Progress Notes (Signed)
Virtual Visit via Video   I connected with Haley Mcdonald on 11/10/18 at  3:00 PM EDT by a video enabled telemedicine application and verified that I am speaking with the correct person using two identifiers. Location patient: Home Location provider: [facility], Office Persons participating in the virtual visit: Briscoe Deutscher, DO, Tylesha Lissandra Keil   I discussed the limitations of evaluation and management by telemedicine and the availability of in person appointments. The patient expressed understanding and agreed to proceed.  Subjective:   HPI: Since the last visit has the patient had any:  Appetite changes? No Unintentional weight loss? No Is medication working well ? Yes Does patient take drug holidays? No Difficulties falling to sleep or maintaining sleep? No Any anxiety?  No Any cardiac issues (fainting or paliptations)? No Suicidal thoughts? No Changes in health since last visit? No New medications? No Any illicit substance abuse? No Has the patient taken his medication today? Yes  Current symptoms: muscle tension. No current suicidal and homicidal ideation. Side effects from treatment: none.   ROS: See pertinent positives and negatives per HPI.  Patient Active Problem List   Diagnosis Date Noted  . Tobacco dependence 06/12/2018  . Status post laparoscopic hysterectomy 06/02/2018  . Insulin resistance   . Genetic testing 03/21/2018  . Family history of breast cancer   . Family history of melanoma   . Family history of lymphoma   . Cervical dysplasia   . 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 History of vitamin D deficiency 06/09/2014    Social History   Tobacco Use  . Smoking status: Current Every Day Smoker    Packs/day: 0.50    Years: 10.00    Pack years: 5.00    Types: Cigarettes  . Smokeless tobacco: Never Used  Substance Use Topics  . Alcohol use: Not Currently    Current Outpatient Medications:  .   acetaminophen (TYLENOL) 325 MG tablet, Take 2 tablets (650 mg total) by mouth every 4 (four) hours as needed for mild pain., Disp: 30 tablet, Rfl: 0 .  albuterol (PROVENTIL HFA;VENTOLIN HFA) 108 (90 Base) MCG/ACT inhaler, Inhale 2 puffs into the lungs every 6 (six) hours as needed for wheezing or shortness of breath., Disp: 1 Inhaler, Rfl: 0 .  budesonide-formoterol (SYMBICORT) 160-4.5 MCG/ACT inhaler, Inhale 2 puffs into the lungs 2 (two) times daily., Disp: 1 Inhaler, Rfl: 3 .  diazepam (VALIUM) 5 MG tablet, Take 1 tablet (5 mg total) by mouth every 12 (twelve) hours as needed for anxiety., Disp: 30 tablet, Rfl: 1 .  docusate sodium (COLACE) 100 MG capsule, Take 1 capsule (100 mg total) by mouth 2 (two) times daily., Disp: 30 capsule, Rfl: 0 .  escitalopram (LEXAPRO) 10 MG tablet, TAKE 1 TABLET(10 MG) BY MOUTH DAILY, Disp: 90 tablet, Rfl: 2 .  [START ON 01/09/2019] lisdexamfetamine (VYVANSE) 70 MG capsule, Take 1 capsule (70 mg total) by mouth daily before breakfast for 30 days., Disp: 30 capsule, Rfl: 0 .  [START ON 12/11/2018] lisdexamfetamine (VYVANSE) 70 MG capsule, Take 1 capsule (70 mg total) by mouth daily before breakfast for 30 days., Disp: 30 capsule, Rfl: 0 .  lisdexamfetamine (VYVANSE) 70 MG capsule, Take 1 capsule (70 mg total) by mouth daily before breakfast for 30 days., Disp: 30 capsule, Rfl: 0 .  metFORMIN (GLUCOPHAGE-XR) 500 MG 24 hr tablet, Take 1 tablet (500 mg total) by mouth at bedtime., Disp: 90 tablet, Rfl: 3 .  SUMAtriptan (IMITREX) 25 MG  tablet, Take 1 tablet (25 mg total) by mouth every 2 (two) hours as needed for migraine. May repeat in 2 hours if headache persists or recurs., Disp: 10 tablet, Rfl: 0 .  traZODone (DESYREL) 100 MG tablet, Take 1 tablet (100 mg total) by mouth at bedtime as needed for sleep., Disp: 30 tablet, Rfl: 3 .  tretinoin (RETIN-A) 0.025 % cream, Apply topically at bedtime., Disp: 45 g, Rfl: 3  Allergies  Allergen Reactions  . Penicillins  Anaphylaxis, Shortness Of Breath and Other (See Comments)    Tachycardia  Has patient had a PCN reaction causing immediate rash, facial/tongue/throat swelling, SOB or lightheadedness with hypotension: Yes Has patient had a PCN reaction causing severe rash involving mucus membranes or skin necrosis: No Has patient had a PCN reaction that required hospitalization: No Has patient had a PCN reaction occurring within the last 10 years: Yes If all of the above answers are "NO", then may proceed with Cephalosporin use.   . Bupropion Rash    Blister   Objective:   GENERAL: Alert, appears well and in no acute distress. HEENT: Atraumatic, conjunctiva clear, no obvious abnormalities on inspection of external nose and ears. NECK: Normal movements of the head and neck. CARDIOPULMONARY: No increased WOB. Speaking in clear sentences. I:E ratio WNL.  MS: Moves all visible extremities without noticeable abnormality. PSYCH: Pleasant and cooperative, well-groomed. Speech normal rate and rhythm. Affect is appropriate. Insight and judgement are appropriate. Attention is focused, linear, and appropriate.  NEURO: CN grossly intact. Oriented as arrived to appointment on time with no prompting. Moves both UE equally.  SKIN: No obvious lesions, wounds, erythema, or cyanosis noted on face or hands.  Assessment and Plan:   Diagnoses and all orders for this visit:  ADD (attention deficit disorder) without hyperactivity Comments: Controlled. Continue current treatment.  Orders: -     lisdexamfetamine (VYVANSE) 70 MG capsule; Take 1 capsule (70 mg total) by mouth daily before breakfast for 30 days. -     lisdexamfetamine (VYVANSE) 70 MG capsule; Take 1 capsule (70 mg total) by mouth daily before breakfast for 30 days. -     lisdexamfetamine (VYVANSE) 70 MG capsule; Take 1 capsule (70 mg total) by mouth daily before breakfast for 30 days.  Tobacco dependence Comments: Discussed the importance of  cessation.  Insulin resistance Comments: Improved.   Situational anxiety Comments: Controlled with prn Valium. Orders: -     diazepam (VALIUM) 5 MG tablet; Take 1 tablet (5 mg total) by mouth every 12 (twelve) hours as needed for anxiety.   . Reviewed expectations re: course of current medical issues. . Discussed self-management of symptoms. . Outlined signs and symptoms indicating need for more acute intervention. . Patient verbalized understanding and all questions were answered. Marland Kitchen Health Maintenance issues including appropriate healthy diet, exercise, and smoking avoidance were discussed with patient. . See orders for this visit as documented in the electronic medical record.  Briscoe Deutscher, DO 11/10/2018  Records requested if needed. Time spent with the patient: 15 minutes, of which >50% was spent in obtaining information about her symptoms, reviewing her previous labs, evaluations, and treatments, counseling her about her condition (please see the discussed topics above), and developing a plan to further investigate it; she had a number of questions which I addressed.

## 2018-11-19 ENCOUNTER — Ambulatory Visit: Payer: Self-pay | Admitting: *Deleted

## 2018-11-19 NOTE — Telephone Encounter (Signed)
Patient complains of fatigue, cough and sore throat.Patient states eh feels feverish- but she does not have thermometer. Patient had sent message to the office and she thinks she is getting a call back during triage call- she states she will call back if needed.

## 2018-12-10 ENCOUNTER — Encounter: Payer: Self-pay | Admitting: Physician Assistant

## 2018-12-10 ENCOUNTER — Other Ambulatory Visit: Payer: Self-pay

## 2018-12-10 ENCOUNTER — Ambulatory Visit (INDEPENDENT_AMBULATORY_CARE_PROVIDER_SITE_OTHER): Admitting: Physician Assistant

## 2018-12-10 VITALS — Temp 98.8°F | Ht 63.0 in | Wt 167.0 lb

## 2018-12-10 DIAGNOSIS — R591 Generalized enlarged lymph nodes: Secondary | ICD-10-CM

## 2018-12-10 DIAGNOSIS — E8881 Metabolic syndrome: Secondary | ICD-10-CM

## 2018-12-10 DIAGNOSIS — Z136 Encounter for screening for cardiovascular disorders: Secondary | ICD-10-CM | POA: Diagnosis not present

## 2018-12-10 DIAGNOSIS — R5383 Other fatigue: Secondary | ICD-10-CM

## 2018-12-10 DIAGNOSIS — Z8639 Personal history of other endocrine, nutritional and metabolic disease: Secondary | ICD-10-CM | POA: Diagnosis not present

## 2018-12-10 DIAGNOSIS — Z13220 Encounter for screening for lipoid disorders: Secondary | ICD-10-CM | POA: Diagnosis not present

## 2018-12-10 DIAGNOSIS — F17200 Nicotine dependence, unspecified, uncomplicated: Secondary | ICD-10-CM | POA: Diagnosis not present

## 2018-12-10 LAB — COMPREHENSIVE METABOLIC PANEL WITH GFR
ALT: 13 U/L (ref 0–35)
AST: 10 U/L (ref 0–37)
Albumin: 4.5 g/dL (ref 3.5–5.2)
Alkaline Phosphatase: 93 U/L (ref 39–117)
BUN: 14 mg/dL (ref 6–23)
CO2: 29 meq/L (ref 19–32)
Calcium: 9.3 mg/dL (ref 8.4–10.5)
Chloride: 103 meq/L (ref 96–112)
Creatinine, Ser: 0.8 mg/dL (ref 0.40–1.20)
GFR: 80.43 mL/min
Glucose, Bld: 103 mg/dL — ABNORMAL HIGH (ref 70–99)
Potassium: 4.2 meq/L (ref 3.5–5.1)
Sodium: 139 meq/L (ref 135–145)
Total Bilirubin: 0.4 mg/dL (ref 0.2–1.2)
Total Protein: 6.5 g/dL (ref 6.0–8.3)

## 2018-12-10 LAB — HEMOGLOBIN A1C: Hgb A1c MFr Bld: 6.1 % (ref 4.6–6.5)

## 2018-12-10 LAB — LIPID PANEL
Cholesterol: 182 mg/dL (ref 0–200)
HDL: 32.1 mg/dL — ABNORMAL LOW (ref >39.00)
NonHDL: 149.85
Total CHOL/HDL Ratio: 6
Triglycerides: 204 mg/dL — ABNORMAL HIGH (ref 0.0–149.0)
VLDL: 40.8 mg/dL — ABNORMAL HIGH (ref 0.0–40.0)

## 2018-12-10 LAB — LDL CHOLESTEROL, DIRECT: Direct LDL: 126 mg/dL

## 2018-12-10 LAB — VITAMIN D 25 HYDROXY (VIT D DEFICIENCY, FRACTURES): VITD: 22.78 ng/mL — ABNORMAL LOW (ref 30.00–100.00)

## 2018-12-10 LAB — TSH: TSH: 2.44 u[IU]/mL (ref 0.35–4.50)

## 2018-12-10 LAB — CBC WITH DIFFERENTIAL/PLATELET
Basophils Absolute: 0.1 10*3/uL (ref 0.0–0.1)
Basophils Relative: 0.6 % (ref 0.0–3.0)
Eosinophils Absolute: 0.3 10*3/uL (ref 0.0–0.7)
Eosinophils Relative: 3.3 % (ref 0.0–5.0)
HCT: 45.6 % (ref 36.0–46.0)
Hemoglobin: 15.3 g/dL — ABNORMAL HIGH (ref 12.0–15.0)
Lymphocytes Relative: 38.1 % (ref 12.0–46.0)
Lymphs Abs: 3.3 10*3/uL (ref 0.7–4.0)
MCHC: 33.6 g/dL (ref 30.0–36.0)
MCV: 87.7 fl (ref 78.0–100.0)
Monocytes Absolute: 0.6 10*3/uL (ref 0.1–1.0)
Monocytes Relative: 7.5 % (ref 3.0–12.0)
Neutro Abs: 4.3 10*3/uL (ref 1.4–7.7)
Neutrophils Relative %: 50.5 % (ref 43.0–77.0)
Platelets: 203 10*3/uL (ref 150.0–400.0)
RBC: 5.2 Mil/uL — ABNORMAL HIGH (ref 3.87–5.11)
RDW: 14 % (ref 11.5–15.5)
WBC: 8.6 10*3/uL (ref 4.0–10.5)

## 2018-12-10 LAB — VITAMIN B12: Vitamin B-12: 224 pg/mL (ref 211–911)

## 2018-12-10 NOTE — Progress Notes (Signed)
Haley Mcdonald is a 38 y.o. female here for a new problem.   History of Present Illness:   Chief Complaint  Patient presents with  . Cyst    Right side of neck    HPI   Lump on R side of neck Has a lump on the R side of her neck x 3 weeks. Has a history of sialadenitis on L side in Sep 2015. She states that her symptoms are reminiscent of that. Did have recent dental abscess treated with clindamycin that resolved. Denies painful swallowing. Has had some slight nasal congestion, but does have history of allergies. Denies: unusual weight changes, fever, any other areas of tenderness, such as axilla. She is a current smoker. When she turns her head to the left she does have a slight "pulling" sensation on the R side of her neck.   Insulin resistance She is here to follow-up on insulin resistance. Last HgbA1c was 5.8% in Oct 2019. She has had slight purposeful weight loss since last year. She does drink regular sodas (Dr. Malachi Bonds) occasionally.  Wt Readings from Last 4 Encounters:  12/10/18 167 lb (75.8 kg)  10/01/18 166 lb 4 oz (75.4 kg)  06/30/18 170 lb (77.1 kg)  06/11/18 174 lb (78.9 kg)   Vit D Deficiency History of this. Due for re-check today. Does not take regular supplementation. Last Vit D about 1 year ago was 15 -- she was put on high vit D supplement.  Fatigue Intermittent. Does feel like she is doing well with Trazodone. No significant acute issues with this.  Lipid Due for lipid screening today.   Past Medical History:  Diagnosis Date  . ADD (attention deficit disorder) without hyperactivity   . Anxiety   . Asthma    as a child  . Cervical dysplasia   . Family history of breast cancer   . Family history of lymphoma   . Family history of melanoma   . Headache   . Migraine with aura   . PONV (postoperative nausea and vomiting)   . Pre-diabetes    gestational diabetes also     Social History   Socioeconomic History  . Marital status: Married     Spouse name: Not on file  . Number of children: Not on file  . Years of education: Not on file  . Highest education level: Not on file  Occupational History  . Not on file  Social Needs  . Financial resource strain: Not on file  . Food insecurity:    Worry: Not on file    Inability: Not on file  . Transportation needs:    Medical: Not on file    Non-medical: Not on file  Tobacco Use  . Smoking status: Current Every Day Smoker    Packs/day: 0.50    Years: 10.00    Pack years: 5.00    Types: Cigarettes  . Smokeless tobacco: Never Used  Substance and Sexual Activity  . Alcohol use: Not Currently  . Drug use: No  . Sexual activity: Yes    Birth control/protection: Surgical    Comment: Hysterectomy  Lifestyle  . Physical activity:    Days per week: Not on file    Minutes per session: Not on file  . Stress: Not on file  Relationships  . Social connections:    Talks on phone: Not on file    Gets together: Not on file    Attends religious service: Not on file    Active  member of club or organization: Not on file    Attends meetings of clubs or organizations: Not on file    Relationship status: Married  . Intimate partner violence:    Fear of current or ex partner: Not on file    Emotionally abused: Not on file    Physically abused: Not on file    Forced sexual activity: Not on file  Other Topics Concern  . Not on file  Social History Narrative  . Not on file    Past Surgical History:  Procedure Laterality Date  . CERVICAL BIOPSY  W/ LOOP ELECTRODE EXCISION    . CESAREAN SECTION     x 1  . CYSTOSCOPY N/A 06/02/2018   Procedure: CYSTOSCOPY;  Surgeon: Salvadore Dom, MD;  Location: [facility];  Service: Gynecology;  Laterality: N/A;  . DILITATION & CURRETTAGE/HYSTROSCOPY WITH NOVASURE ABLATION N/A 04/01/2015   Procedure: DILATATION & CURETTAGE/HYSTEROSCOPY WITH NOVASURE ABLATION;  Surgeon: [name], MD;  Location: [facility];  Service:  Gynecology;  Laterality: N/A;  . ENDOMETRIAL ABLATION    . TONSILLECTOMY    . TOTAL LAPAROSCOPIC HYSTERECTOMY WITH SALPINGECTOMY Bilateral 06/02/2018   Procedure: TOTAL LAPAROSCOPIC HYSTERECTOMY WITH SALPINGECTOMY;  Surgeon: Salvadore Dom, MD;  Location: [facility];  Service: Gynecology;  Laterality: Bilateral;  1.5 hours OR time per Dr Talbert Nan. Will need extended recovery bed.  . TUBAL LIGATION      Family History  Problem Relation Age of Onset  . Breast cancer Mother 65       "3x"  . Lung cancer Father   . Breast cancer Maternal Aunt 36  . Lymphoma Paternal Grandmother 73  . Colon cancer Maternal Grandfather 36  . Melanoma Paternal Grandfather 35  . Melanoma Cousin     Allergies  Allergen Reactions  . Penicillins Anaphylaxis, Shortness Of Breath and Other (See Comments)    Tachycardia  Has patient had a PCN reaction causing immediate rash, facial/tongue/throat swelling, SOB or lightheadedness with hypotension: Yes Has patient had a PCN reaction causing severe rash involving mucus membranes or skin necrosis: No Has patient had a PCN reaction that required hospitalization: No Has patient had a PCN reaction occurring within the last 10 years: Yes If all of the above answers are "NO", then may proceed with Cephalosporin use.   . Bupropion Rash    Blister    Current Medications:   Current Outpatient Medications:  .  acetaminophen (TYLENOL) 325 MG tablet, Take 2 tablets (650 mg total) by mouth every 4 (four) hours as needed for mild pain., Disp: 30 tablet, Rfl: 0 .  albuterol (PROVENTIL HFA;VENTOLIN HFA) 108 (90 Base) MCG/ACT inhaler, Inhale 2 puffs into the lungs every 6 (six) hours as needed for wheezing or shortness of breath., Disp: 1 Inhaler, Rfl: 0 .  diazepam (VALIUM) 5 MG tablet, Take 1 tablet (5 mg total) by mouth every 12 (twelve) hours as needed for anxiety., Disp: 30 tablet, Rfl: 1 .  docusate sodium (COLACE) 100 MG capsule, Take 1 capsule (100  mg total) by mouth 2 (two) times daily., Disp: 30 capsule, Rfl: 0 .  escitalopram (LEXAPRO) 10 MG tablet, TAKE 1 TABLET(10 MG) BY MOUTH DAILY, Disp: 90 tablet, Rfl: 2 .  [START ON 01/09/2019] lisdexamfetamine (VYVANSE) 70 MG capsule, Take 1 capsule (70 mg total) by mouth daily before breakfast for 30 days., Disp: 30 capsule, Rfl: 0 .  [START ON 12/11/2018] lisdexamfetamine (VYVANSE) 70 MG capsule, Take 1 capsule (70 mg total) by  mouth daily before breakfast for 30 days., Disp: 30 capsule, Rfl: 0 .  lisdexamfetamine (VYVANSE) 70 MG capsule, Take 1 capsule (70 mg total) by mouth daily before breakfast for 30 days., Disp: 30 capsule, Rfl: 0 .  metFORMIN (GLUCOPHAGE-XR) 500 MG 24 hr tablet, Take 1 tablet (500 mg total) by mouth at bedtime., Disp: 90 tablet, Rfl: 3 .  SUMAtriptan (IMITREX) 25 MG tablet, Take 1 tablet (25 mg total) by mouth every 2 (two) hours as needed for migraine. May repeat in 2 hours if headache persists or recurs., Disp: 10 tablet, Rfl: 0 .  traZODone (DESYREL) 100 MG tablet, Take 1 tablet (100 mg total) by mouth at bedtime as needed for sleep., Disp: 30 tablet, Rfl: 3 .  tretinoin (RETIN-A) 0.025 % cream, Apply topically at bedtime., Disp: 45 g, Rfl: 3   Review of Systems:   Review of Systems  Constitutional: Negative for chills, fever, malaise/fatigue and weight loss.  HENT: Negative for ear discharge, ear pain and hearing loss.   Respiratory: Negative for shortness of breath.   Cardiovascular: Negative for chest pain, orthopnea, claudication and leg swelling.  Gastrointestinal: Negative for heartburn, nausea and vomiting.  Neurological: Negative for dizziness, tingling and headaches.    Vitals:   Vitals:   12/10/18 1031  Temp: 98.8 F (37.1 C)  TempSrc: Oral  Weight: 167 lb (75.8 kg)  Height: 5\' 3"  (1.6 m)     Body mass index is 29.58 kg/m.  Physical Exam:   Physical Exam Vitals signs and nursing note reviewed.  Constitutional:      General: She is not in  acute distress.    Appearance: She is well-developed. She is not ill-appearing or toxic-appearing.  HENT:     Head: Normocephalic and atraumatic.     Right Ear: Tympanic membrane, ear canal and external ear normal. Tympanic membrane is not erythematous, retracted or bulging.     Left Ear: Tympanic membrane, ear canal and external ear normal. Tympanic membrane is not erythematous, retracted or bulging.     Nose: Nose normal.     Right Sinus: No maxillary sinus tenderness or frontal sinus tenderness.     Left Sinus: No maxillary sinus tenderness or frontal sinus tenderness.     Mouth/Throat:     Pharynx: Uvula midline. No posterior oropharyngeal erythema.  Eyes:     General: Lids are normal.     Conjunctiva/sclera: Conjunctivae normal.  Neck:     Trachea: Trachea normal.  Cardiovascular:     Rate and Rhythm: Normal rate and regular rhythm.     Heart sounds: Normal heart sounds, S1 normal and S2 normal.  Pulmonary:     Effort: Pulmonary effort is normal.     Breath sounds: Normal breath sounds. No decreased breath sounds, wheezing, rhonchi or rales.  Lymphadenopathy:     Head:     Right side of head: Submandibular adenopathy present.     Cervical: No cervical adenopathy.  Skin:    General: Skin is warm and dry.  Neurological:     Mental Status: She is alert.  Psychiatric:        Speech: Speech normal.        Behavior: Behavior normal. Behavior is cooperative.      Assessment and Plan:   Haley Mcdonald was seen today for cyst.  Diagnoses and all orders for this visit:  Lymphadenopathy Will check labs. No red flags, other than duration of sx is x 3 weeks. She would like to do labs today  and defer imaging. Worsening precautions advised. Further work-up based on lab results and symptom duration. -     TSH  Tobacco dependence Counseled. -     Lipid panel  Insulin resistance Counseled on reduction of sugary beverages. Limit fast foods. Increase veggies. -     Hemoglobin  A1c  History of vitamin D deficiency -     VITAMIN D 25 Hydroxy (Vit-D Deficiency, Fractures)  Fatigue, unspecified type Multifactorial, does have hx of low B12, will recheck. Overall, sleep has improved with trazodone but she is only taking it prn -- encouraged use daily. Also work on cleaning up diet and smoking cessation. Follow-up if worsens or persists. -     CBC with Differential/Platelet -     Comprehensive metabolic panel -     Vitamin B12 -     TSH  Encounter for lipid screening for cardiovascular disease -     Lipid panel  . Reviewed expectations re: course of current medical issues. . Discussed self-management of symptoms. . Outlined signs and symptoms indicating need for more acute intervention. . Patient verbalized understanding and all questions were answered. . See orders for this visit as documented in the electronic medical record. . Patient received an After-Visit Summary.  Inda Coke, PA-C

## 2018-12-11 NOTE — Progress Notes (Signed)
Haley Mcdonald is a 38 y.o. female is here for follow up.  History of Present Illness:   HPI: See Assessment and Plan section for Problem Based Charting of issues discussed today.   Health Maintenance Due  Topic Date Due  . TETANUS/TDAP  04/16/2000   Depression screen PHQ 2/9 12/16/2017  Decreased Interest 0  Down, Depressed, Hopeless 0  PHQ - 2 Score 0   PMHx, SurgHx, SocialHx, FamHx, Medications, and Allergies were reviewed in the Visit Navigator and updated as appropriate.   Patient Active Problem List   Diagnosis Date Noted  . Tobacco dependence 06/12/2018  . Status post laparoscopic hysterectomy 06/02/2018  . Insulin resistance   . Genetic testing 03/21/2018  . Family history of breast cancer   . Family history of melanoma   . Family history of lymphoma   . Cervical dysplasia   . 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 History of vitamin D deficiency 06/09/2014   Social History   Tobacco Use  . Smoking status: Current Every Day Smoker    Packs/day: 0.50    Years: 10.00    Pack years: 5.00    Types: Cigarettes  . Smokeless tobacco: Never Used  Substance Use Topics  . Alcohol use: Not Currently  . Drug use: No   Current Medications and Allergies   Current Outpatient Medications:  .  acetaminophen (TYLENOL) 325 MG tablet, Take 2 tablets (650 mg total) by mouth every 4 (four) hours as needed for mild pain., Disp: 30 tablet, Rfl: 0 .  albuterol (PROVENTIL HFA;VENTOLIN HFA) 108 (90 Base) MCG/ACT inhaler, Inhale 2 puffs into the lungs every 6 (six) hours as needed for wheezing or shortness of breath., Disp: 1 Inhaler, Rfl: 0 .  Cholecalciferol 1.25 MG (50000 UT) TABS, 50,000 units PO qwk for 12 weeks., Disp: 12 tablet, Rfl: 0 .  cyanocobalamin (,VITAMIN B-12,) 1000 MCG/ML injection, 1000 mcg (1 mg) injection once per week for four weeks, followed by 1000 mcg injection once per month., Disp: 1 mL, Rfl: 15 .  diazepam (VALIUM) 5  MG tablet, Take 1 tablet (5 mg total) by mouth every 12 (twelve) hours as needed for anxiety., Disp: 30 tablet, Rfl: 1 .  docusate sodium (COLACE) 100 MG capsule, Take 1 capsule (100 mg total) by mouth 2 (two) times daily., Disp: 30 capsule, Rfl: 0 .  Dulaglutide (TRULICITY) 5.32 DJ/2.4QA SOPN, 0.75 mg [place] q wk x 2 wks , then increase to 1.5 mg [place] if tolerating, Disp: 4 pen, Rfl: 2 .  escitalopram (LEXAPRO) 10 MG tablet, TAKE 1 TABLET(10 MG) BY MOUTH DAILY, Disp: 90 tablet, Rfl: 2 .  [START ON 01/09/2019] lisdexamfetamine (VYVANSE) 70 MG capsule, Take 1 capsule (70 mg total) by mouth daily before breakfast for 30 days., Disp: 30 capsule, Rfl: 0 .  lisdexamfetamine (VYVANSE) 70 MG capsule, Take 1 capsule (70 mg total) by mouth daily before breakfast for 30 days., Disp: 30 capsule, Rfl: 0 .  lisdexamfetamine (VYVANSE) 70 MG capsule, Take 1 capsule (70 mg total) by mouth daily before breakfast for 30 days., Disp: 30 capsule, Rfl: 0 .  metFORMIN (GLUCOPHAGE-XR) 500 MG 24 hr tablet, Take 1 tablet (500 mg total) by mouth at bedtime., Disp: 90 tablet, Rfl: 3 .  SUMAtriptan (IMITREX) 25 MG tablet, Take 1 tablet (25 mg total) by mouth every 2 (two) hours as needed for migraine. May repeat in 2 hours if headache persists or recurs., Disp: 10 tablet, Rfl: 0 .  traZODone (  DESYREL) 100 MG tablet, Take 1 tablet (100 mg total) by mouth at bedtime as needed for sleep., Disp: 30 tablet, Rfl: 3 .  tretinoin (RETIN-A) 0.025 % cream, Apply topically at bedtime., Disp: 45 g, Rfl: 3 .  varenicline (CHANTIX CONTINUING MONTH PAK) 1 MG tablet, Take 1 tablet (1 mg total) by mouth 2 (two) times daily., Disp: 180 tablet, Rfl: 0  Allergies  Allergen Reactions  . Penicillins Anaphylaxis, Shortness Of Breath and Other (See Comments)    Tachycardia  Has patient had a PCN reaction causing immediate rash, facial/tongue/throat swelling, SOB or lightheadedness with hypotension: Yes Has patient had a PCN reaction causing severe rash  involving mucus membranes or skin necrosis: No Has patient had a PCN reaction that required hospitalization: No Has patient had a PCN reaction occurring within the last 10 years: Yes If all of the above answers are "NO", then may proceed with Cephalosporin use.   . Bupropion Rash    Blister   Review of Systems   Pertinent items are noted in the HPI. Otherwise, a complete ROS is negative.  Physical Exam   Physical Exam Vitals signs and nursing note reviewed.  HENT:     Head: Normocephalic and atraumatic.  Eyes:     Pupils: Pupils are equal, round, and reactive to light.  Neck:     Musculoskeletal: Normal range of motion and neck supple.  Cardiovascular:     Rate and Rhythm: Normal rate and regular rhythm.     Heart sounds: Normal heart sounds.  Pulmonary:     Effort: Pulmonary effort is normal.  Abdominal:     Palpations: Abdomen is soft.  Skin:    General: Skin is warm.  Psychiatric:        Behavior: Behavior normal.    Results for orders placed or performed in visit on 12/10/18  CBC with Differential/Platelet  Result Value Ref Range   WBC 8.6 4.0 - 10.5 K/uL   RBC 5.20 (H) 3.87 - 5.11 Mil/uL   Hemoglobin 15.3 (H) 12.0 - 15.0 g/dL   HCT 45.6 36.0 - 46.0 %   MCV 87.7 78.0 - 100.0 fl   MCHC 33.6 30.0 - 36.0 g/dL   RDW 14.0 11.5 - 15.5 %   Platelets 203.0 150.0 - 400.0 K/uL   Neutrophils Relative % 50.5 43.0 - 77.0 %   Lymphocytes Relative 38.1 12.0 - 46.0 %   Monocytes Relative 7.5 3.0 - 12.0 %   Eosinophils Relative 3.3 0.0 - 5.0 %   Basophils Relative 0.6 0.0 - 3.0 %   Neutro Abs 4.3 1.4 - 7.7 K/uL   Lymphs Abs 3.3 0.7 - 4.0 K/uL   Monocytes Absolute 0.6 0.1 - 1.0 K/uL   Eosinophils Absolute 0.3 0.0 - 0.7 K/uL   Basophils Absolute 0.1 0.0 - 0.1 K/uL  Comprehensive metabolic panel  Result Value Ref Range   Sodium 139 135 - 145 mEq/L   Potassium 4.2 3.5 - 5.1 mEq/L   Chloride 103 96 - 112 mEq/L   CO2 29 19 - 32 mEq/L   Glucose, Bld 103 (H) 70 - 99 mg/dL    BUN 14 6 - 23 mg/dL   Creatinine, Ser 0.80 0.40 - 1.20 mg/dL   Total Bilirubin 0.4 0.2 - 1.2 mg/dL   Alkaline Phosphatase 93 39 - 117 U/L   AST 10 0 - 37 U/L   ALT 13 0 - 35 U/L   Total Protein 6.5 6.0 - 8.3 g/dL   Albumin 4.5 3.5 -  5.2 g/dL   Calcium 9.3 8.4 - 10.5 mg/dL   GFR 80.43 >60.00 mL/min  VITAMIN D 25 Hydroxy (Vit-D Deficiency, Fractures)  Result Value Ref Range   VITD 22.78 (L) 30.00 - 100.00 ng/mL  Vitamin B12  Result Value Ref Range   Vitamin B-12 224 211 - 911 pg/mL  Lipid panel  Result Value Ref Range   Cholesterol 182 0 - 200 mg/dL   Triglycerides 204.0 (H) 0.0 - 149.0 mg/dL   HDL 32.10 (L) >39.00 mg/dL   VLDL 40.8 (H) 0.0 - 40.0 mg/dL   Total CHOL/HDL Ratio 6    NonHDL 149.85   TSH  Result Value Ref Range   TSH 2.44 0.35 - 4.50 uIU/mL  Hemoglobin A1c  Result Value Ref Range   Hgb A1c MFr Bld 6.1 4.6 - 6.5 %  LDL cholesterol, direct  Result Value Ref Range   Direct LDL 126.0 mg/dL   Assessment and Plan   Tobacco dependence See AVS and orders.  ADD (attention deficit disorder) without hyperactivity Currently controlled.  Some decline after leaving.  Will consider medication to have on hand during that time.  Insulin resistance Lab Results  Component Value Date   HGBA1C 6.1 12/10/2018   Worsened.  Soda intake has been very scant.  She has not been consistent with taking metformin.  She will work on these.  Vitamin D deficiency Still low.  Will call in high-dose supplements.  Mixed hyperlipidemia Discussed labs.  Patient will work on blood sugar regulation and weight loss.  Elevated hemoglobin (HCC) Mild but consistent.  Patient is a smoker.  We discussed the importance of smoking cessation in this setting.  Will monitor closely and continue to watch for any other symptoms.  Meds ordered this encounter  Medications  . cyanocobalamin (,VITAMIN B-12,) 1000 MCG/ML injection    Sig: 1000 mcg (1 mg) injection once per week for four weeks, followed  by 1000 mcg injection once per month.    Dispense:  1 mL    Refill:  15  . Dulaglutide (TRULICITY) 2.83 MO/2.9UT SOPN    Sig: 0.75 mg [place] q wk x 2 wks , then increase to 1.5 mg [place] if tolerating    Dispense:  4 pen    Refill:  2  . Cholecalciferol 1.25 MG (50000 UT) TABS    Sig: 50,000 units PO qwk for 12 weeks.    Dispense:  12 tablet    Refill:  0  . varenicline (CHANTIX CONTINUING MONTH PAK) 1 MG tablet    Sig: Take 1 tablet (1 mg total) by mouth 2 (two) times daily.    Dispense:  180 tablet    Refill:  0   . Reviewed expectations re: course of current medical issues. . Discussed self-management of symptoms. . Outlined signs and symptoms indicating need for more acute intervention. . Patient verbalized understanding and all questions were answered. Marland Kitchen Health Maintenance issues including appropriate healthy diet, exercise, and smoking avoidance were discussed with patient. . See orders for this visit as documented in the electronic medical record. . Patient received an After Visit Summary.  Briscoe Deutscher, DO [place], [place]

## 2018-12-12 ENCOUNTER — Ambulatory Visit: Admitting: Family Medicine

## 2018-12-12 ENCOUNTER — Ambulatory Visit (INDEPENDENT_AMBULATORY_CARE_PROVIDER_SITE_OTHER): Admitting: Family Medicine

## 2018-12-12 VITALS — BP 130/90 | HR 76 | Temp 98.6°F | Ht 63.0 in | Wt 170.0 lb

## 2018-12-12 DIAGNOSIS — E782 Mixed hyperlipidemia: Secondary | ICD-10-CM

## 2018-12-12 DIAGNOSIS — F17200 Nicotine dependence, unspecified, uncomplicated: Secondary | ICD-10-CM

## 2018-12-12 DIAGNOSIS — E7800 Pure hypercholesterolemia, unspecified: Secondary | ICD-10-CM | POA: Diagnosis not present

## 2018-12-12 DIAGNOSIS — E8881 Metabolic syndrome: Secondary | ICD-10-CM | POA: Diagnosis not present

## 2018-12-12 DIAGNOSIS — D582 Other hemoglobinopathies: Secondary | ICD-10-CM

## 2018-12-12 DIAGNOSIS — F988 Other specified behavioral and emotional disorders with onset usually occurring in childhood and adolescence: Secondary | ICD-10-CM

## 2018-12-12 DIAGNOSIS — E559 Vitamin D deficiency, unspecified: Secondary | ICD-10-CM

## 2018-12-12 DIAGNOSIS — E88819 Insulin resistance, unspecified: Secondary | ICD-10-CM

## 2018-12-16 MED ORDER — CYANOCOBALAMIN 1000 MCG/ML IJ SOLN: INTRAMUSCULAR | 15 refills | Status: DC

## 2018-12-16 MED ORDER — DULAGLUTIDE 0.75 MG/0.5ML ~~LOC~~ SOAJ: SUBCUTANEOUS | 2 refills | Status: DC

## 2018-12-16 MED ORDER — VARENICLINE TARTRATE 1 MG PO TABS: 1.0000 mg | ORAL_TABLET | Freq: Two times a day (BID) | ORAL | 0 refills | Status: AC

## 2018-12-16 MED ORDER — CHOLECALCIFEROL 1.25 MG (50000 UT) PO TABS: ORAL_TABLET | ORAL | 0 refills | Status: DC

## 2018-12-20 ENCOUNTER — Encounter: Payer: Self-pay | Admitting: Family Medicine

## 2018-12-20 DIAGNOSIS — D582 Other hemoglobinopathies: Secondary | ICD-10-CM | POA: Insufficient documentation

## 2018-12-20 DIAGNOSIS — E7800 Pure hypercholesterolemia, unspecified: Secondary | ICD-10-CM | POA: Insufficient documentation

## 2018-12-20 DIAGNOSIS — E782 Mixed hyperlipidemia: Secondary | ICD-10-CM | POA: Insufficient documentation

## 2018-12-20 DIAGNOSIS — E559 Vitamin D deficiency, unspecified: Secondary | ICD-10-CM | POA: Insufficient documentation

## 2018-12-20 HISTORY — DX: Pure hypercholesterolemia, unspecified: E78.00

## 2018-12-20 NOTE — Assessment & Plan Note (Signed)
See AVS

## 2018-12-20 NOTE — Assessment & Plan Note (Signed)
Still low.  Will call in high-dose supplements.

## 2018-12-20 NOTE — Assessment & Plan Note (Signed)
Discussed labs.  Patient will work on blood sugar regulation and weight loss.

## 2018-12-20 NOTE — Assessment & Plan Note (Signed)
Currently controlled.  Some decline after leaving.  Will consider medication to have on hand during that time.

## 2018-12-20 NOTE — Assessment & Plan Note (Signed)
Mild but consistent.  Patient is a smoker.  We discussed the importance of smoking cessation in this setting.  Will monitor closely and continue to watch for any other symptoms.

## 2018-12-20 NOTE — Assessment & Plan Note (Signed)
Lab Results  Component Value Date   HGBA1C 6.1 12/10/2018   Worsened.  Soda intake has been very scant.  She has not been consistent with taking metformin.  She will work on these.

## 2018-12-22 ENCOUNTER — Encounter: Payer: Self-pay | Admitting: Family Medicine

## 2018-12-24 ENCOUNTER — Other Ambulatory Visit: Payer: Self-pay | Admitting: Family Medicine

## 2019-01-06 ENCOUNTER — Other Ambulatory Visit: Payer: Self-pay | Admitting: Family Medicine

## 2019-01-06 MED ORDER — AMPHETAMINE-DEXTROAMPHETAMINE 20 MG PO TABS: 20.0000 mg | ORAL_TABLET | Freq: Two times a day (BID) | ORAL | 0 refills | Status: DC

## 2019-02-10 ENCOUNTER — Ambulatory Visit (INDEPENDENT_AMBULATORY_CARE_PROVIDER_SITE_OTHER): Admitting: Family Medicine

## 2019-02-10 ENCOUNTER — Encounter: Payer: Self-pay | Admitting: Family Medicine

## 2019-02-10 ENCOUNTER — Other Ambulatory Visit: Payer: Self-pay

## 2019-02-10 VITALS — BP 118/74 | HR 73 | Temp 97.7°F | Ht 62.0 in | Wt 167.0 lb

## 2019-02-10 DIAGNOSIS — H61032 Chondritis of left external ear: Secondary | ICD-10-CM | POA: Diagnosis not present

## 2019-02-10 NOTE — Progress Notes (Signed)
   Chief Complaint:  Haley Mcdonald is a 38 y.o. female who presents for same day appointment with a chief complaint of Ear Pain.   Assessment/Plan:  Chondritis of left auricle No signs of infection or cellulitis.  No trauma to suggest hematoma or cauliflower ear.  Will start conservative management with ice packs a few times a day as needed as well as Mobic 15 mg daily for the next 5 to 7 days.  If no improvement, would consider trial of prednisone.     Subjective:  HPI:  Ear Pain, acute problem Started this morning.  Located to her left ear.  Has had some muffled hearing.  Pain around that area as well as in front of her ear.  No obvious trauma to the area.  No fevers or chills.  No obvious precipitating events or other injuries.  No treatments tried.  No other obvious alleviating or aggravating factors.    ROS: Per HPI  PMH: She reports that she has been smoking cigarettes. She has a 5.00 pack-year smoking history. She has never used smokeless tobacco. She reports previous alcohol use. She reports that she does not use drugs.      Objective:  Physical Exam: BP 118/74 (BP Location: Left Arm, Patient Position: Sitting, Cuff Size: Normal)   Pulse 73   Temp 97.7 F (36.5 C) (Oral)   Ht 5\' 2"  (1.575 m)   Wt 167 lb (75.8 kg)   LMP 05/07/2018 (Exact Date)   SpO2 97%   BMI 30.54 kg/m   Gen: NAD, resting comfortably HEENT: Left auricle erythematous and slightly edematous.  Mildly tender to palpation.  Left EAC clear.  Left TM clear.  Right ear normal.  No areas of skin breakdown.       Algis Greenhouse. Jerline Pain, MD 02/10/2019 3:58 PM

## 2019-02-11 ENCOUNTER — Other Ambulatory Visit: Payer: Self-pay | Admitting: Family Medicine

## 2019-02-11 MED ORDER — LISDEXAMFETAMINE DIMESYLATE 70 MG PO CAPS: 70.0000 mg | ORAL_CAPSULE | Freq: Every day | ORAL | 0 refills | Status: DC

## 2019-02-12 ENCOUNTER — Other Ambulatory Visit: Payer: Self-pay | Admitting: Family Medicine

## 2019-02-12 MED ORDER — DOXYCYCLINE HYCLATE 100 MG PO TABS: 100.0000 mg | ORAL_TABLET | Freq: Two times a day (BID) | ORAL | 0 refills | Status: DC

## 2019-02-12 MED ORDER — NEOMYCIN-POLYMYXIN-HC 3.5-10000-1 OT SOLN: 3.0000 [drp] | Freq: Four times a day (QID) | OTIC | 0 refills | Status: DC

## 2019-02-15 ENCOUNTER — Other Ambulatory Visit: Payer: Self-pay | Admitting: Family Medicine

## 2019-02-15 MED ORDER — LEVOFLOXACIN 500 MG PO TABS: 500.0000 mg | ORAL_TABLET | Freq: Every day | ORAL | 0 refills | Status: DC

## 2019-02-15 MED ORDER — PREDNISONE 5 MG PO TABS: ORAL_TABLET | ORAL | 0 refills | Status: DC

## 2019-02-15 MED ORDER — OXYCODONE-ACETAMINOPHEN 5-325 MG PO TABS: 1.0000 | ORAL_TABLET | ORAL | 0 refills | Status: DC | PRN

## 2019-02-18 ENCOUNTER — Other Ambulatory Visit: Payer: Self-pay | Admitting: Family Medicine

## 2019-02-18 DIAGNOSIS — H9202 Otalgia, left ear: Secondary | ICD-10-CM

## 2019-02-19 ENCOUNTER — Other Ambulatory Visit: Payer: Self-pay

## 2019-02-19 DIAGNOSIS — G43009 Migraine without aura, not intractable, without status migrainosus: Secondary | ICD-10-CM

## 2019-02-19 MED ORDER — ALBUTEROL SULFATE HFA 108 (90 BASE) MCG/ACT IN AERS: 2.0000 | INHALATION_SPRAY | Freq: Four times a day (QID) | RESPIRATORY_TRACT | 1 refills | Status: DC | PRN

## 2019-02-19 MED ORDER — SUMATRIPTAN SUCCINATE 25 MG PO TABS: 25.0000 mg | ORAL_TABLET | ORAL | 0 refills | Status: DC | PRN

## 2019-03-24 ENCOUNTER — Other Ambulatory Visit: Payer: Self-pay | Admitting: Family Medicine

## 2019-03-24 MED ORDER — LISDEXAMFETAMINE DIMESYLATE 70 MG PO CAPS: 70.0000 mg | ORAL_CAPSULE | Freq: Every day | ORAL | 0 refills | Status: DC

## 2019-04-01 ENCOUNTER — Encounter (INDEPENDENT_AMBULATORY_CARE_PROVIDER_SITE_OTHER): Admitting: Family Medicine

## 2019-04-05 ENCOUNTER — Encounter: Payer: Self-pay | Admitting: Family Medicine

## 2019-04-05 NOTE — Progress Notes (Signed)
ERROR

## 2019-04-07 ENCOUNTER — Other Ambulatory Visit: Payer: Self-pay

## 2019-04-07 ENCOUNTER — Ambulatory Visit (INDEPENDENT_AMBULATORY_CARE_PROVIDER_SITE_OTHER)

## 2019-04-07 ENCOUNTER — Other Ambulatory Visit

## 2019-04-07 DIAGNOSIS — M79602 Pain in left arm: Secondary | ICD-10-CM

## 2019-04-21 ENCOUNTER — Ambulatory Visit (INDEPENDENT_AMBULATORY_CARE_PROVIDER_SITE_OTHER): Admitting: Family Medicine

## 2019-04-21 VITALS — BP 110/70 | HR 90 | Temp 98.6°F | Ht 62.0 in | Wt 174.0 lb

## 2019-04-21 DIAGNOSIS — D229 Melanocytic nevi, unspecified: Secondary | ICD-10-CM | POA: Diagnosis not present

## 2019-04-22 ENCOUNTER — Encounter: Payer: Self-pay | Admitting: Family Medicine

## 2019-04-27 ENCOUNTER — Encounter: Payer: Self-pay | Admitting: Family Medicine

## 2019-04-27 NOTE — Progress Notes (Signed)
SUBJECTIVE:  Haley Mcdonald is a 38 y.o. female who presents for lesion removal. We discussed this procedure, including option of not performing surgery, technique of surgery and potential for scarring.  OBJECTIVE:  Patient appears well. Vitals are normal. Skin: left anterior upper arm with dark, irritated mole, 3 mm diameter.  ASSESSMENT:  normal complete skin exam, no suspicious lesions, nevi inflamed size 3 mm noted on the anterior left upper arm.  PLAN:  After informed consent was obtained, using Betadine for cleansing and 1% Lidocaine with epinephrine for anesthetic, with sterile technique, shave excision was performed. Antibiotic dressing is applied, and wound care instructions provided.  Be alert for any signs of cutaneous infection. The procedure was well tolerated without complications. Follow up: the patient may return prn.

## 2019-04-29 ENCOUNTER — Encounter: Payer: Self-pay | Admitting: Family Medicine

## 2019-04-30 ENCOUNTER — Other Ambulatory Visit: Payer: Self-pay | Admitting: Family Medicine

## 2019-04-30 MED ORDER — PREDNISONE 10 MG PO TABS: ORAL_TABLET | ORAL | 0 refills | Status: DC

## 2019-04-30 MED ORDER — VALACYCLOVIR HCL 1 G PO TABS: 1000.0000 mg | ORAL_TABLET | Freq: Three times a day (TID) | ORAL | 0 refills | Status: AC

## 2019-04-30 MED ORDER — GABAPENTIN 100 MG PO CAPS: 100.0000 mg | ORAL_CAPSULE | Freq: Three times a day (TID) | ORAL | 3 refills | Status: DC | PRN

## 2019-05-11 ENCOUNTER — Other Ambulatory Visit: Payer: Self-pay

## 2019-05-11 MED ORDER — LISDEXAMFETAMINE DIMESYLATE 70 MG PO CAPS: 70.0000 mg | ORAL_CAPSULE | Freq: Every day | ORAL | 0 refills | Status: DC

## 2019-05-13 ENCOUNTER — Other Ambulatory Visit: Payer: Self-pay | Admitting: Physician Assistant

## 2019-05-13 MED ORDER — DOCUSATE SODIUM 50 MG PO CAPS: 50.0000 mg | ORAL_CAPSULE | Freq: Two times a day (BID) | ORAL | 0 refills | Status: DC

## 2019-05-13 MED ORDER — POLYETHYLENE GLYCOL 3350 17 GM/SCOOP PO POWD: ORAL | 0 refills | Status: DC

## 2019-05-13 MED ORDER — BISACODYL EC 5 MG PO TBEC: 5.0000 mg | DELAYED_RELEASE_TABLET | Freq: Every day | ORAL | 0 refills | Status: DC | PRN

## 2019-05-21 ENCOUNTER — Other Ambulatory Visit: Payer: Self-pay

## 2019-05-25 ENCOUNTER — Ambulatory Visit (INDEPENDENT_AMBULATORY_CARE_PROVIDER_SITE_OTHER): Admitting: Obstetrics and Gynecology

## 2019-05-25 ENCOUNTER — Other Ambulatory Visit: Payer: Self-pay

## 2019-05-25 ENCOUNTER — Encounter: Payer: Self-pay | Admitting: Obstetrics and Gynecology

## 2019-05-25 VITALS — BP 118/78 | HR 84 | Temp 97.2°F | Ht 63.0 in | Wt 176.0 lb

## 2019-05-25 DIAGNOSIS — Z01419 Encounter for gynecological examination (general) (routine) without abnormal findings: Secondary | ICD-10-CM

## 2019-05-25 NOTE — Patient Instructions (Signed)
EXERCISE AND DIET:  We recommended that you start or continue a regular exercise program for good health. Regular exercise means any activity that makes your heart beat faster and makes you sweat.  We recommend exercising at least 30 minutes per day at least 3 days a week, preferably 4 or 5.  We also recommend a diet low in fat and sugar.  Inactivity, poor dietary choices and obesity can cause diabetes, heart attack, stroke, and kidney damage, among others.    ALCOHOL AND SMOKING:  Women should limit their alcohol intake to no more than 7 drinks/beers/glasses of wine (combined, not each!) per week. Moderation of alcohol intake to this level decreases your risk of breast cancer and liver damage. And of course, no recreational drugs are part of a healthy lifestyle.  And absolutely no smoking or even second hand smoke. Most people know smoking can cause heart and lung diseases, but did you know it also contributes to weakening of your bones? Aging of your skin?  Yellowing of your teeth and nails?  CALCIUM AND VITAMIN D:  Adequate intake of calcium and Vitamin D are recommended.  The recommendations for exact amounts of these supplements seem to change often, but generally speaking 1,000 mg of calcium (between diet and supplement) and 800 units of Vitamin D per day seems prudent. Certain women may benefit from higher intake of Vitamin D.  If you are among these women, your doctor will have told you during your visit.    PAP SMEARS:  Pap smears, to check for cervical cancer or precancers,  have traditionally been done yearly, although recent scientific advances have shown that most women can have pap smears less often.  However, every woman still should have a physical exam from her gynecologist every year. It will include a breast check, inspection of the vulva and vagina to check for abnormal growths or skin changes, a visual exam of the cervix, and then an exam to evaluate the size and shape of the uterus and  ovaries.  And after 38 years of age, a rectal exam is indicated to check for rectal cancers. We will also provide age appropriate advice regarding health maintenance, like when you should have certain vaccines, screening for sexually transmitted diseases, bone density testing, colonoscopy, mammograms, etc.   MAMMOGRAMS:  All women over 40 years old should have a yearly mammogram. Many facilities now offer a "3D" mammogram, which may cost around $50 extra out of pocket. If possible,  we recommend you accept the option to have the 3D mammogram performed.  It both reduces the number of women who will be called back for extra views which then turn out to be normal, and it is better than the routine mammogram at detecting truly abnormal areas.    COLON CANCER SCREENING: Now recommend starting at age 45. At this time colonoscopy is not covered for routine screening until 50. There are take home tests that can be done between 45-49.   COLONOSCOPY:  Colonoscopy to screen for colon cancer is recommended for all women at age 50.  We know, you hate the idea of the prep.  We agree, BUT, having colon cancer and not knowing it is worse!!  Colon cancer so often starts as a polyp that can be seen and removed at colonscopy, which can quite literally save your life!  And if your first colonoscopy is normal and you have no family history of colon cancer, most women don't have to have it again for   10 years.  Once every ten years, you can do something that may end up saving your life, right?  We will be happy to help you get it scheduled when you are ready.  Be sure to check your insurance coverage so you understand how much it will cost.  It may be covered as a preventative service at no cost, but you should check your particular policy.      Breast Self-Awareness Breast self-awareness means being familiar with how your breasts look and feel. It involves checking your breasts regularly and reporting any changes to your  health care provider. Practicing breast self-awareness is important. A change in your breasts can be a sign of a serious medical problem. Being familiar with how your breasts look and feel allows you to find any problems early, when treatment is more likely to be successful. All women should practice breast self-awareness, including women who have had breast implants. How to do a breast self-exam One way to learn what is normal for your breasts and whether your breasts are changing is to do a breast self-exam. To do a breast self-exam: Look for Changes  1. Remove all the clothing above your waist. 2. Stand in front of a mirror in a room with good lighting. 3. Put your hands on your hips. 4. Push your hands firmly downward. 5. Compare your breasts in the mirror. Look for differences between them (asymmetry), such as: ? Differences in shape. ? Differences in size. ? Puckers, dips, and bumps in one breast and not the other. 6. Look at each breast for changes in your skin, such as: ? Redness. ? Scaly areas. 7. Look for changes in your nipples, such as: ? Discharge. ? Bleeding. ? Dimpling. ? Redness. ? A change in position. Feel for Changes Carefully feel your breasts for lumps and changes. It is best to do this while lying on your back on the floor and again while sitting or standing in the shower or tub with soapy water on your skin. Feel each breast in the following way:  Place the arm on the side of the breast you are examining above your head.  Feel your breast with the other hand.  Start in the nipple area and make  inch (2 cm) overlapping circles to feel your breast. Use the pads of your three middle fingers to do this. Apply light pressure, then medium pressure, then firm pressure. The light pressure will allow you to feel the tissue closest to the skin. The medium pressure will allow you to feel the tissue that is a little deeper. The firm pressure will allow you to feel the tissue  close to the ribs.  Continue the overlapping circles, moving downward over the breast until you feel your ribs below your breast.  Move one finger-width toward the center of the body. Continue to use the  inch (2 cm) overlapping circles to feel your breast as you move slowly up toward your collarbone.  Continue the up and down exam using all three pressures until you reach your armpit.  Write Down What You Find  Write down what is normal for each breast and any changes that you find. Keep a written record with breast changes or normal findings for each breast. By writing this information down, you do not need to depend only on memory for size, tenderness, or location. Write down where you are in your menstrual cycle, if you are still menstruating. If you are having trouble noticing differences   in your breasts, do not get discouraged. With time you will become more familiar with the variations in your breasts and more comfortable with the exam. How often should I examine my breasts? Examine your breasts every month. If you are breastfeeding, the best time to examine your breasts is after a feeding or after using a breast pump. If you menstruate, the best time to examine your breasts is 5-7 days after your period is over. During your period, your breasts are lumpier, and it may be more difficult to notice changes. When should I see my health care provider? See your health care provider if you notice:  A change in shape or size of your breasts or nipples.  A change in the skin of your breast or nipples, such as a reddened or scaly area.  Unusual discharge from your nipples.  A lump or thick area that was not there before.  Pain in your breasts.  Anything that concerns you.  

## 2019-05-25 NOTE — Progress Notes (Signed)
38 y.o. [id number] Married White or Caucasian Not Hispanic or Latino female here for annual exam.  H/O TLH/BS/LOA in 10/19. No vaginal bleeding. No dyspareunia, no bowel or bladder c/o.  She had shingles last year.     Patient's last menstrual period was 05/07/2018 (exact date).          Sexually active: Yes.    The current method of family planning is status post hysterectomy.    Exercising: No.  The patient does not participate in regular exercise at present. Smoker:  yes  Health Maintenance: Pap: 02/27/2018 WNL NEG HPV History of abnormal Pap:  Yes, h/o LEEP in 2006-2008, all paps since then have been normal.  MMG:  12-07-15 Birads 1 benign Colonoscopy:  no BMD:   No TDaP:  UTD  Gardasil: completed 1/3, declines finishing    reports that she has been smoking cigarettes. She has a 2.50 pack-year smoking history. She has never used smokeless tobacco. She reports previous alcohol use. She reports that she does not use drugs.  kids are 55,82,and 78 year old (all boys). Husband is home with the kids and going to school.  Past Medical History:  Diagnosis Date  . ADD (attention deficit disorder) without hyperactivity   . Anxiety   . Asthma    as a child  . Cervical dysplasia   . Family history of breast cancer   . Family history of lymphoma   . Family history of melanoma   . Headache   . Migraine with aura   . PONV (postoperative nausea and vomiting)   . Pre-diabetes    gestational diabetes also  . Pure hypercholesterolemia 12/20/2018    Past Surgical History:  Procedure Laterality Date  . CERVICAL BIOPSY  W/ LOOP ELECTRODE EXCISION    . CESAREAN SECTION     x 1  . CYSTOSCOPY N/A 06/02/2018   Procedure: CYSTOSCOPY;  Surgeon: Salvadore Dom, MD;  Location: [facility];  Service: Gynecology;  Laterality: N/A;  . DILITATION & CURRETTAGE/HYSTROSCOPY WITH NOVASURE ABLATION N/A 04/01/2015   Procedure: DILATATION & CURETTAGE/HYSTEROSCOPY WITH NOVASURE ABLATION;   Surgeon: [name], MD;  Location: [facility];  Service: Gynecology;  Laterality: N/A;  . ENDOMETRIAL ABLATION    . TONSILLECTOMY    . TOTAL LAPAROSCOPIC HYSTERECTOMY WITH SALPINGECTOMY Bilateral 06/02/2018   Procedure: TOTAL LAPAROSCOPIC HYSTERECTOMY WITH SALPINGECTOMY;  Surgeon: Salvadore Dom, MD;  Location: [facility];  Service: Gynecology;  Laterality: Bilateral;  1.5 hours OR time per Dr Talbert Nan. Will need extended recovery bed.  . TUBAL LIGATION      Current Outpatient Medications  Medication Sig Dispense Refill  . acetaminophen (TYLENOL) 325 MG tablet Take 2 tablets (650 mg total) by mouth every 4 (four) hours as needed for mild pain. 30 tablet 0  . albuterol (VENTOLIN HFA) 108 (90 Base) MCG/ACT inhaler Inhale 2 puffs into the lungs every 6 (six) hours as needed for wheezing or shortness of breath. 8 g 1  . amphetamine-dextroamphetamine (ADDERALL) 20 MG tablet Take 1 tablet (20 mg total) by mouth 2 (two) times daily. 60 tablet 0  . bisacodyl (BISACODYL) 5 MG EC tablet Take 1 tablet (5 mg total) by mouth daily as needed for moderate constipation. 30 tablet 0  . Dulaglutide (TRULICITY) [id number] [id number] SOPN 0.75 mg [place] q wk x 2 wks , then increase to 1.5 mg [place] if tolerating 4 pen 2  . escitalopram (LEXAPRO) 10 MG tablet TAKE 1 TABLET(10 MG) BY MOUTH DAILY 90  tablet 2  . lisdexamfetamine (VYVANSE) 70 MG capsule Take 1 capsule (70 mg total) by mouth daily before breakfast. 90 capsule 0  . SUMAtriptan (IMITREX) 25 MG tablet Take 1 tablet (25 mg total) by mouth every 2 (two) hours as needed for migraine. May repeat in 2 hours if headache persists or recurs. 10 tablet 0  . traZODone (DESYREL) 100 MG tablet Take 1 tablet (100 mg total) by mouth at bedtime as needed for sleep. 30 tablet 3  . tretinoin (RETIN-A) 0.025 % cream Apply topically at bedtime. 45 g 3  . cyanocobalamin (,VITAMIN B-12,) 1000 MCG/ML injection 1000 mcg (1 mg) injection once per week for four weeks,  followed by 1000 mcg injection once per month. (Patient not taking: Reported on 05/25/2019) 1 mL 15  . diazepam (VALIUM) 5 MG tablet Take 1 tablet (5 mg total) by mouth every 12 (twelve) hours as needed for anxiety. (Patient not taking: Reported on 05/25/2019) 30 tablet 1  . lisdexamfetamine (VYVANSE) 70 MG capsule Take 1 capsule (70 mg total) by mouth daily before breakfast for 30 days. 30 capsule 0  . lisdexamfetamine (VYVANSE) 70 MG capsule Take 1 capsule (70 mg total) by mouth daily before breakfast for 30 days. 30 capsule 0  . lisdexamfetamine (VYVANSE) 70 MG capsule Take 1 capsule (70 mg total) by mouth daily before breakfast. 30 capsule 0  . lisdexamfetamine (VYVANSE) 70 MG capsule Take 1 capsule (70 mg total) by mouth daily before breakfast. 30 capsule 0   No current facility-administered medications for this visit.     Family History  Problem Relation Age of Onset  . Breast cancer Mother 22       "3x"  . Lung cancer Father   . Breast cancer Maternal Aunt 2  . Lymphoma Paternal Grandmother 1  . Colon cancer Maternal Grandfather 72  . Melanoma Paternal Grandfather 49  . Melanoma Cousin     Review of Systems  Constitutional: Negative.   HENT: Negative.   Eyes: Negative.   Respiratory: Negative.   Cardiovascular: Negative.   Gastrointestinal: Negative.   Endocrine: Negative.   Genitourinary: Negative.   Musculoskeletal: Negative.   Skin: Negative.   Allergic/Immunologic: Negative.   Neurological: Negative.   Hematological: Negative.   Psychiatric/Behavioral: Negative.     Exam:   BP 118/78 (BP Location: Right Arm, Patient Position: Sitting, Cuff Size: Normal)   Pulse 84   Temp (!) 97.2 F (36.2 C) (Temporal)   Ht 5\' 3"  (1.6 m)   Wt 176 lb (79.8 kg)   LMP 05/07/2018 (Exact Date)   BMI 31.18 kg/m   Weight change: @WEIGHTCHANGE @ Height:   Height: 5\' 3"  (160 cm)  Ht Readings from Last 3 Encounters:  05/25/19 5\' 3"  (1.6 m)  04/22/19 5\' 2"  (1.575 m)  02/10/19 5\' 2"   (1.575 m)    General appearance: alert, cooperative and appears stated age Head: Normocephalic, without obvious abnormality, atraumatic Neck: no adenopathy, supple, symmetrical, trachea midline and thyroid normal to inspection and palpation Lungs: clear to auscultation bilaterally Cardiovascular: regular rate and rhythm Breasts: normal appearance, no masses or tenderness Abdomen: soft, non-tender; non distended,  no masses,  no organomegaly Extremities: extremities normal, atraumatic, no cyanosis or edema Skin: Skin color, texture, turgor normal. No rashes or lesions Lymph nodes: Cervical, supraclavicular, and axillary nodes normal. No abnormal inguinal nodes palpated Neurologic: Grossly normal   Pelvic: External genitalia:  no lesions              Urethra:  normal appearing urethra with no masses, tenderness or lesions              Bartholins and Skenes: normal                 Vagina: normal appearing vagina with normal color and discharge, no lesions              Cervix: absent               Bimanual Exam:  Uterus:  uterus absent              Adnexa: no mass, fullness, tenderness               Rectovaginal: Confirms               Anus:  normal sphincter tone, no lesions  Chaperone was present for exam.  A:  Well Woman with normal exam  H/o hysterectomy  Elevated risk of breast cancer, tyrer-cuzick risk of 19.6%  P:   No pap this year  Labs with primary  Mammogram, 3D  Will set her up for a consultation at the high risk breast clinic consult  We discussed abbreviated breast MRI  Discussed breast self exam  Discussed calcium and vit D intake

## 2019-05-26 ENCOUNTER — Other Ambulatory Visit: Payer: Self-pay

## 2019-05-26 DIAGNOSIS — Z803 Family history of malignant neoplasm of breast: Secondary | ICD-10-CM

## 2019-06-01 NOTE — Progress Notes (Signed)
Referral to [facility] placed.

## 2019-06-08 ENCOUNTER — Telehealth: Payer: Self-pay | Admitting: Oncology

## 2019-06-08 NOTE — Telephone Encounter (Signed)
Received a referral from Dr. Talbert Nan for Ms. Haley Mcdonald to be seen by Dr. Jana Hakim in the high risk breast clinic. Ms. Haley Mcdonald has been cld and scheduled to see Dr. Jana Hakim on 11/16 at 4pm. Pt aware to arrive 15 minutes early.

## 2019-06-23 ENCOUNTER — Other Ambulatory Visit: Payer: Self-pay | Admitting: Physician Assistant

## 2019-06-23 ENCOUNTER — Other Ambulatory Visit: Payer: Self-pay

## 2019-06-23 MED ORDER — LISDEXAMFETAMINE DIMESYLATE 70 MG PO CAPS: 70.0000 mg | ORAL_CAPSULE | Freq: Every day | ORAL | 0 refills | Status: DC

## 2019-06-29 ENCOUNTER — Other Ambulatory Visit: Payer: Self-pay | Admitting: Physician Assistant

## 2019-06-29 MED ORDER — MUPIROCIN 2 % EX OINT: TOPICAL_OINTMENT | CUTANEOUS | 0 refills | Status: DC

## 2019-06-29 MED ORDER — AMOXICILLIN-POT CLAVULANATE 875-125 MG PO TABS: 1.0000 | ORAL_TABLET | Freq: Two times a day (BID) | ORAL | 0 refills | Status: DC

## 2019-07-05 NOTE — Progress Notes (Signed)
[place]  [phone] [phone]     ID: Haley Mcdonald DOB: 1981/08/19  MR#: [id number]  YYT#:[id number]  Patient Care Team: Briscoe Deutscher, DO as PCP - General (Family Medicine) Salvadore Dom, MD as Consulting Physician (Obstetrics and Gynecology) Rylynn Schoneman, Virgie Dad, MD as Consulting Physician (Oncology) Chauncey Cruel, MD OTHER MD:  CHIEF COMPLAINT: At high risk of breast cancer  CURRENT TREATMENT: Tamoxifen; intensified screening   HISTORY OF CURRENT ILLNESS: Haley Mcdonald has a family history of cancer, including breast cancer in her mother and a maternal aunt. Her mother was diagnosed at age 72 and her aunt at age 51. Her aunt subsequently passed away at age 58. She reports she and her mother were tested for BRCA approximately 16 years ago, and they were both negative. She underwent genetic counseling on 03/13/2018 and was tested on a melanoma-specific genetic panel. Her results were again negative.  She does not have a history of breast biopsies. She did, however, undergo bilateral diagnostic mammography and left breast ultrasonography on 12/07/2015 for left breast pain and spontaneous left breast nipple discharge. This showed: breast density category B; mild skin thickening of the upper-outer left areola, without underlying mammographic or targeted sonographic abnormalities.   The patient's subsequent history is as detailed below.   INTERVAL HISTORY: Aloise "Haley Mcdonald" was evaluated in the high risk breast cancer clinic on 07/06/2019.   Using the [employer] risk assessment tool she carries an 18% lifetime risk.  Using the [place] she has a 38% lifetime risk.  Accordingly we will use 28% as her best approximation to her lifetime risk.   REVIEW OF SYSTEMS: [place] "Haley Mcdonald" denies unusual headaches, visual changes, nausea, vomiting, stiff neck, dizziness, or gait imbalance. There has been no cough, phlegm  production, or pleurisy, no chest pain or pressure, and no change in bowel or bladder habits. The patient denies fever, rash, bleeding, unexplained fatigue or unexplained weight loss. A detailed review of systems was otherwise entirely negative.   PAST MEDICAL HISTORY: Past Medical History:  Diagnosis Date  . ADD (attention deficit disorder) without hyperactivity   . Anxiety   . Asthma    as a child  . Cervical dysplasia   . Family history of breast cancer   . Family history of lymphoma   . Family history of melanoma   . Headache   . Migraine with aura   . PONV (postoperative nausea and vomiting)   . Pre-diabetes    gestational diabetes also  . Pure hypercholesterolemia 12/20/2018    PAST SURGICAL HISTORY: Past Surgical History:  Procedure Laterality Date  . CERVICAL BIOPSY  W/ LOOP ELECTRODE EXCISION    . CESAREAN SECTION     x 1  . CYSTOSCOPY N/A 06/02/2018   Procedure: CYSTOSCOPY;  Surgeon: Salvadore Dom, MD;  Location: [facility];  Service: Gynecology;  Laterality: N/A;  . DILITATION & CURRETTAGE/HYSTROSCOPY WITH NOVASURE ABLATION N/A 04/01/2015   Procedure: DILATATION & CURETTAGE/HYSTEROSCOPY WITH NOVASURE ABLATION;  Surgeon: [name], MD;  Location: [facility];  Service: Gynecology;  Laterality: N/A;  . ENDOMETRIAL ABLATION    . TONSILLECTOMY    . TOTAL LAPAROSCOPIC HYSTERECTOMY WITH SALPINGECTOMY Bilateral 06/02/2018   Procedure: TOTAL LAPAROSCOPIC HYSTERECTOMY WITH SALPINGECTOMY;  Surgeon: Salvadore Dom, MD;  Location: [facility];  Service: Gynecology;  Laterality: Bilateral;  1.5 hours OR time per Dr Talbert Nan. Will need extended recovery bed.  . TUBAL LIGATION  FAMILY HISTORY: Family History  Problem Relation Age of Onset  . Breast cancer Mother 38       "3x"  . Lung cancer Father   . Breast cancer Maternal Aunt 22  . Lymphoma Paternal Grandmother 11  . Colon cancer Maternal Grandfather 36  . Melanoma Paternal  Grandfather 52  . Melanoma Cousin    Patient's father was 61 years old when he died from lung cancer. Patient's mother is living as of 06/2019. She was diagnosed with breast cancer at age 58.  She is BRCA negative according to the patient.   The patient also notes breast cancer in a maternal aunt at age 74. The patient denies a family hx of ovarian cancer. She does note sarcoma in a paternal aunt at age 21, melanoma in a paternal cousin and in her paternal grandfather at age 8, lymphoma in her paternal grandmother at age 57, and colon cancer in her maternal grandfather. She has 6 siblings, one full sister, three half-sisters, and two half-brothers. The half siblings are through her father.   GYNECOLOGIC HISTORY:  Patient's last menstrual period was 05/07/2018 (exact date). Menarche: 38 years old Age at first live birth: 39 years old [place] P 3 LMP: 04/2018 Contraceptive: used for ~10 years HRT: never used  Hysterectomy? Yes, 06/02/2018 BSO? Ovaries in place   SOCIAL HISTORY: (updated 06/2019)  Haley Mcdonald "Haley Mcdonald" works as a Quarry manager at [employer].  Her husband Haley Mcdonald is a Furniture conservator/restorer.  Her children are Haley Mcdonald 18, who will be going to [place] state in the fall 2021, Haley Mcdonald who is 69 and Haley Mcdonald who is 6.  The patient is not a church attender    ADVANCED DIRECTIVES: In the absence of any documentation to the contrary, the patient's spouse is their HCPOA.   HEALTH MAINTENANCE: Social History   Tobacco Use  . Smoking status: Current Every Day Smoker    Packs/day: 0.25    Years: 10.00    Pack years: 2.50    Types: Cigarettes  . Smokeless tobacco: Never Used  Substance Use Topics  . Alcohol use: Not Currently  . Drug use: No     Colonoscopy: n/a (age)  PAP: 02/2018, prior to hysterectomy  Bone density: n/a (age)   Allergies  Allergen Reactions  . Penicillins Anaphylaxis, Shortness Of Breath and Other (See Comments)    Tachycardia  Has patient had a PCN reaction causing  immediate rash, facial/tongue/throat swelling, SOB or lightheadedness with hypotension: Yes Has patient had a PCN reaction causing severe rash involving mucus membranes or skin necrosis: No Has patient had a PCN reaction that required hospitalization: No Has patient had a PCN reaction occurring within the last 10 years: Yes If all of the above answers are "NO", then may proceed with Cephalosporin use.   . Bupropion Rash    Blister    Current Outpatient Medications  Medication Sig Dispense Refill  . acetaminophen (TYLENOL) 325 MG tablet Take 2 tablets (650 mg total) by mouth every 4 (four) hours as needed for mild pain. 30 tablet 0  . amphetamine-dextroamphetamine (ADDERALL) 20 MG tablet Take 1 tablet (20 mg total) by mouth 2 (two) times daily. 60 tablet 0  . cyanocobalamin (,VITAMIN B-12,) 1000 MCG/ML injection 1000 mcg (1 mg) injection once per week for four weeks, followed by 1000 mcg injection once per month. 1 mL 15  . diazepam (VALIUM) 5 MG tablet Take 1 tablet (5 mg total) by mouth every 12 (twelve) hours as needed for  anxiety. 30 tablet 1  . Dulaglutide (TRULICITY) 8.29 HB/7.1IR SOPN 0.75 mg [place] q wk x 2 wks , then increase to 1.5 mg [place] if tolerating 4 pen 2  . escitalopram (LEXAPRO) 10 MG tablet TAKE 1 TABLET(10 MG) BY MOUTH DAILY 90 tablet 2  . SUMAtriptan (IMITREX) 25 MG tablet Take 1 tablet (25 mg total) by mouth every 2 (two) hours as needed for migraine. May repeat in 2 hours if headache persists or recurs. 10 tablet 0  . traZODone (DESYREL) 100 MG tablet Take 1 tablet (100 mg total) by mouth at bedtime as needed for sleep. 30 tablet 3  . tretinoin (RETIN-A) 0.025 % cream Apply topically at bedtime. 45 g 3  . lisdexamfetamine (VYVANSE) 70 MG capsule Take 1 capsule (70 mg total) by mouth daily before breakfast. 30 capsule 0   No current facility-administered medications for this visit.     OBJECTIVE: Young white woman in no acute distress  Vitals:   07/06/19 1606  BP: (!)  130/94  Pulse: 85  Resp: 18  Temp: 97.8 F (36.6 C)  SpO2: 99%     Body mass index is 30.7 kg/m.   Wt Readings from Last 3 Encounters:  07/06/19 173 lb 4.8 oz (78.6 kg)  05/25/19 176 lb (79.8 kg)  04/22/19 174 lb (78.9 kg)      ECOG FS:1 - Symptomatic but completely ambulatory  Ocular: Sclerae unicteric, pupils round and equal Ear-nose-throat: Oropharynx clear and moist Lymphatic: No cervical or supraclavicular adenopathy Lungs no rales or rhonchi Heart regular rate and rhythm Abd soft, nontender, positive bowel sounds MSK no focal spinal tenderness, no joint edema Neuro: non-focal, well-oriented, appropriate affect Breasts: No masses palpated.  Breasts are of average "lumpiness" for age".  Both axillae are benign   LAB RESULTS:  CMP     Component Value Date/Time   NA 139 12/10/2018 1045   K 4.2 12/10/2018 1045   CL 103 12/10/2018 1045   CO2 29 12/10/2018 1045   GLUCOSE 103 (H) 12/10/2018 1045   BUN 14 12/10/2018 1045   CREATININE 0.80 12/10/2018 1045   CALCIUM 9.3 12/10/2018 1045   PROT 6.5 12/10/2018 1045   ALBUMIN 4.5 12/10/2018 1045   AST 10 12/10/2018 1045   ALT 13 12/10/2018 1045   ALKPHOS 93 12/10/2018 1045   BILITOT 0.4 12/10/2018 1045   GFRNONAA >60 05/29/2018 1545   GFRAA >60 05/29/2018 1545    No results found for: TOTALPROTELP, ALBUMINELP, A1GS, A2GS, BETS, BETA2SER, GAMS, MSPIKE, SPEI  No results found for: KPAFRELGTCHN, LAMBDASER, KAPLAMBRATIO  Lab Results  Component Value Date   WBC 8.6 12/10/2018   NEUTROABS 4.3 12/10/2018   HGB 15.3 (H) 12/10/2018   HCT 45.6 12/10/2018   MCV 87.7 12/10/2018   PLT 203.0 12/10/2018    No results found for: LABCA2  No components found for: CVELFY101  No results for input(s): INR in the last 168 hours.  No results found for: LABCA2  No results found for: BPZ025  No results found for: ENI778  No results found for: EUM353  No results found for: CA2729  No components found for: HGQUANT  No  results found for: CEA1 / No results found for: CEA1   No results found for: AFPTUMOR  No results found for: CHROMOGRNA  No results found for: PSA1  No visits with results within 3 Day(s) from this visit.  Latest known visit with results is:  Office Visit on 12/10/2018  Component Date Value Ref Range Status  .  WBC 12/10/2018 8.6  4.0 - 10.5 K/uL Final  . RBC 12/10/2018 5.20* 3.87 - 5.11 Mil/uL Final  . Hemoglobin 12/10/2018 15.3* 12.0 - 15.0 g/dL Final  . HCT 12/10/2018 45.6  36.0 - 46.0 % Final  . MCV 12/10/2018 87.7  78.0 - 100.0 fl Final  . MCHC 12/10/2018 33.6  30.0 - 36.0 g/dL Final  . RDW 12/10/2018 14.0  11.5 - 15.5 % Final  . Platelets 12/10/2018 203.0  150.0 - 400.0 K/uL Final  . Neutrophils Relative % 12/10/2018 50.5  43.0 - 77.0 % Final  . Lymphocytes Relative 12/10/2018 38.1  12.0 - 46.0 % Final  . Monocytes Relative 12/10/2018 7.5  3.0 - 12.0 % Final  . Eosinophils Relative 12/10/2018 3.3  0.0 - 5.0 % Final  . Basophils Relative 12/10/2018 0.6  0.0 - 3.0 % Final  . Neutro Abs 12/10/2018 4.3  1.4 - 7.7 K/uL Final  . Lymphs Abs 12/10/2018 3.3  0.7 - 4.0 K/uL Final  . Monocytes Absolute 12/10/2018 0.6  0.1 - 1.0 K/uL Final  . Eosinophils Absolute 12/10/2018 0.3  0.0 - 0.7 K/uL Final  . Basophils Absolute 12/10/2018 0.1  0.0 - 0.1 K/uL Final  . Sodium 12/10/2018 139  135 - 145 mEq/L Final  . Potassium 12/10/2018 4.2  3.5 - 5.1 mEq/L Final  . Chloride 12/10/2018 103  96 - 112 mEq/L Final  . CO2 12/10/2018 29  19 - 32 mEq/L Final  . Glucose, Bld 12/10/2018 103* 70 - 99 mg/dL Final  . BUN 12/10/2018 14  6 - 23 mg/dL Final  . Creatinine, Ser 12/10/2018 0.80  0.40 - 1.20 mg/dL Final  . Total Bilirubin 12/10/2018 0.4  0.2 - 1.2 mg/dL Final  . Alkaline Phosphatase 12/10/2018 93  39 - 117 U/L Final  . AST 12/10/2018 10  0 - 37 U/L Final  . ALT 12/10/2018 13  0 - 35 U/L Final  . Total Protein 12/10/2018 6.5  6.0 - 8.3 g/dL Final  . Albumin 12/10/2018 4.5  3.5 - 5.2 g/dL  Final  . Calcium 12/10/2018 9.3  8.4 - 10.5 mg/dL Final  . GFR 12/10/2018 80.43  >60.00 mL/min Final  . VITD 12/10/2018 22.78* 30.00 - 100.00 ng/mL Final  . Vitamin B-12 12/10/2018 224  211 - 911 pg/mL Final  . Cholesterol 12/10/2018 182  0 - 200 mg/dL Final   ATP III Classification       Desirable:  < 200 mg/dL               Borderline High:  200 - 239 mg/dL          High:  > = 240 mg/dL  . Triglycerides 12/10/2018 204.0* 0.0 - 149.0 mg/dL Final   Normal:  <150 mg/dLBorderline High:  150 - 199 mg/dL  . HDL 12/10/2018 32.10* >39.00 mg/dL Final  . VLDL 12/10/2018 40.8* 0.0 - 40.0 mg/dL Final  . Total CHOL/HDL Ratio 12/10/2018 6   Final                  Men          Women1/2 Average Risk     3.4          3.3Average Risk          5.0          4.42X Average Risk          9.6          7.13X Average Risk  15.0          11.0                      . NonHDL 12/10/2018 149.85   Final   NOTE:  Non-HDL goal should be 30 mg/dL higher than patient's LDL goal (i.e. LDL goal of < 70 mg/dL, would have non-HDL goal of < 100 mg/dL)  . TSH 12/10/2018 2.44  0.35 - 4.50 uIU/mL Final  . Hgb A1c MFr Bld 12/10/2018 6.1  4.6 - 6.5 % Final   Glycemic Control Guidelines for People with Diabetes:Non Diabetic:  <6%Goal of Therapy: <7%Additional Action Suggested:  >8%   . Direct LDL 12/10/2018 126.0  mg/dL Final   Optimal:  <100 mg/dLNear or Above Optimal:  100-129 mg/dLBorderline High:  130-159 mg/dLHigh:  160-189 mg/dLVery High:  >190 mg/dL    (this displays the last labs from the last 3 days)  No results found for: TOTALPROTELP, ALBUMINELP, A1GS, A2GS, BETS, BETA2SER, GAMS, MSPIKE, SPEI (this displays SPEP labs)  No results found for: KPAFRELGTCHN, LAMBDASER, KAPLAMBRATIO (kappa/lambda light chains)  No results found for: HGBA, HGBA2QUANT, HGBFQUANT, HGBSQUAN (Hemoglobinopathy evaluation)   No results found for: LDH  No results found for: IRON, TIBC, IRONPCTSAT (Iron and TIBC)  Lab Results   Component Value Date   FERRITIN 132.3 12/16/2017    Urinalysis No results found for: COLORURINE, APPEARANCEUR, LABSPEC, PHURINE, GLUCOSEU, HGBUR, BILIRUBINUR, KETONESUR, PROTEINUR, UROBILINOGEN, NITRITE, LEUKOCYTESUR   STUDIES: No results found.  ELIGIBLE FOR AVAILABLE RESEARCH PROTOCOL: no  ASSESSMENT: 38 y.o. [place] woman with a 28% lifetime risk of breast cancer as discussed above.  (1) genetics testing 03/21/2018 through the [facility] Common Hereditary Cancers Panel + Melanoma Panel found no deleterious mutations in APC, ATM, AXIN2, BARD1, BMPR1A, BRCA1, BRCA2, BRIP1, CDH1, CDKN2A (p14ARF), CDKN2A (p16INK4a), CKD4, CHEK2, CTNNA1, DICER1, EPCAM (Deletion/duplication testing only), GREM1 (promoter region deletion/duplication testing only), KIT, MEN1, MLH1, MSH2, MSH3, MSH6, MUTYH, NBN, NF1, NHTL1, PALB2, PDGFRA, PMS2, POLD1, POLE, PTEN, RAD50, RAD51C, RAD51D, SDHB, SDHC, SDHD, SMAD4, SMARCA4. STK11, TP53, TSC1, TSC2, and VHL.  The following genes were evaluated for sequence changes only: SDHA and HOXB13 c.251G>A variant only. The Melanoma panel offered by [facility] includes sequencing and/or deletion duplication testing of the following 12 genes: BAP1, BRCA1, BRCA2, BRIP1, CDK4, CDKN2A (p14ARF), CDKN2A (p16INK4a), MC1R, POT1, PTEN, RB1, TERT, and TP53.  The following gene was evaluated for sequence changes only: MITF (c.952G>A, p.GLU318Lys variant only).  (2) status Mcdonald total abdominal hysterectomy with bilateral salpingectomy 06/02/2018 with benign pathology  (a) ovaries still in place, with no menopausal symptoms as of November 2020  (3) risk reduction: Tamoxifen started 07/06/2019  (4) intensified screening:  Breast MRI every November  Breast mammography every May  By annual breast exam  PLAN: I spent approximately 60 minutes face to face with Angeline with more than 50% of that time spent in counseling and coordination of care. Specifically we reviewed the biology of the patient's  risk and the specifics of her situation.   Given her family history age and breast CT she has a lifetime risk of nearly 30% of developing breast cancer.  Alexiah has essentially two ways of lowering that risk. One of them is intensified screening.  The other one is risk reduction.    Risk reduction options include bilateral mastectomies, bilateral 04 to me, and tamoxifen.  Of these, tamoxifen would be particularly favorable because she has no uterus, and she tolerated oral contraceptives for more than a decade  with no clotting complications.  She understands if you took tamoxifen for 5 years it would reduce her risk of breast cancer by 50%.  We discussed the possible toxicities side effects and complications of this agent in detail.  We then discussed intensified screening. This would mean adding a yearly MRI to yearly mammography with tomography. This approach greatly increases sensitivity. Any breast cancer that may develop should be found at the earliest possible stage.  The main concern with this approach is the possibility of "false positives " requiring repeated studies, biopsies, etc.  Also there are issues regarding cost even if partial insurance coverage is obtained.    After this discussion Pam would like to proceed with tamoxifen and I have placed that prescription in for her.  She is also interested in intensified screening and she will be set up for an MRI of the breast later this month  Hayley will let us know if she has significant problems with hot flashes or any other side effects of tamoxifen.  There are several ways of dealing with this and we can discuss them with her if the problem occurs.  She will also let me know if the cost of contrast MRI is prohibitive.  She will also let me know if the cost of breast MRI is prohibitive.She wil also le me know if the cost of breast MRI is prohibitive.  Assuming all goes well she will see me again in late May 2021, after her mammography that  month.  She will then follow-up with Dr. Talbert Nan her gynecologist after her breast MRI in November.  Tarrie has a good understanding of the overall plan. She agrees with it. She knows the goal of treatment in her case is prevention.  She will call with any problems that may develop before her next visit here.   Chauncey Cruel, MD   07/06/2019 4:58 PM Medical Oncology and Hematology [facility] [place], [place] [address] Tel. [phone]    Fax. [phone]   This document serves as a record of services personally performed by Lurline Del, MD. It was created on his behalf by Wilburn Mylar, a trained medical scribe. The creation of this record is based on the scribe's personal observations and the provider's statements to them.   I, Lurline Del MD, have reviewed the above documentation for accuracy and completeness, and I agree with the above.

## 2019-07-06 ENCOUNTER — Inpatient Hospital Stay: Attending: Oncology | Admitting: Oncology

## 2019-07-06 ENCOUNTER — Other Ambulatory Visit: Payer: Self-pay

## 2019-07-06 DIAGNOSIS — Z800 Family history of malignant neoplasm of digestive organs: Secondary | ICD-10-CM | POA: Diagnosis not present

## 2019-07-06 DIAGNOSIS — Z9079 Acquired absence of other genital organ(s): Secondary | ICD-10-CM

## 2019-07-06 DIAGNOSIS — Z1239 Encounter for other screening for malignant neoplasm of breast: Secondary | ICD-10-CM | POA: Insufficient documentation

## 2019-07-06 DIAGNOSIS — Z803 Family history of malignant neoplasm of breast: Secondary | ICD-10-CM | POA: Diagnosis not present

## 2019-07-06 DIAGNOSIS — Z90710 Acquired absence of both cervix and uterus: Secondary | ICD-10-CM

## 2019-07-06 DIAGNOSIS — F17210 Nicotine dependence, cigarettes, uncomplicated: Secondary | ICD-10-CM

## 2019-07-06 DIAGNOSIS — Z801 Family history of malignant neoplasm of trachea, bronchus and lung: Secondary | ICD-10-CM | POA: Diagnosis not present

## 2019-07-06 DIAGNOSIS — J45909 Unspecified asthma, uncomplicated: Secondary | ICD-10-CM | POA: Diagnosis not present

## 2019-07-06 DIAGNOSIS — Z807 Family history of other malignant neoplasms of lymphoid, hematopoietic and related tissues: Secondary | ICD-10-CM

## 2019-07-06 DIAGNOSIS — Z9189 Other specified personal risk factors, not elsewhere classified: Secondary | ICD-10-CM | POA: Diagnosis present

## 2019-07-06 DIAGNOSIS — Z79899 Other long term (current) drug therapy: Secondary | ICD-10-CM

## 2019-07-06 DIAGNOSIS — N644 Mastodynia: Secondary | ICD-10-CM | POA: Diagnosis not present

## 2019-07-06 DIAGNOSIS — F419 Anxiety disorder, unspecified: Secondary | ICD-10-CM

## 2019-07-07 ENCOUNTER — Telehealth: Payer: Self-pay | Admitting: Oncology

## 2019-07-07 MED ORDER — TAMOXIFEN CITRATE 20 MG PO TABS: 20.0000 mg | ORAL_TABLET | Freq: Every day | ORAL | 12 refills | Status: AC

## 2019-07-07 NOTE — Telephone Encounter (Signed)
I talk with patient regarding schedule  

## 2019-07-07 NOTE — Addendum Note (Signed)
Addended by: Chauncey Cruel on: 07/07/2019 08:08 AM   Modules accepted: Orders

## 2019-07-07 NOTE — Progress Notes (Signed)
Haley Mcdonald is a 38 y.o. female is here to discuss: Transfer of care  I acted as a Education administrator for [employer], PA-C Anselmo Pickler, LPN  History of Present Illness:   Chief Complaint  Patient presents with  . Transfer of care    from Dr. Juleen China  . ADD    HPI  Pt here today for transfer of care from Dr. Juleen China  I checked the PMP Aware for [place] Controlled substance: Vyvanse 70 mg daily & Adderall 20 mg as needed Pharmacy: [place], [place] [address] Status : Vyvanse last Rx filled 06/27/2019, # 30, Adderall 20 mg last Rx filled  01/06/2019, # 60, 0 refill. New Findings: None  ADD Pt here following up today, currently taking Vyvanse 70 mg daily and using Adderall 20 mg as needed. Medications are working well. Denies: chest pain, palpitations, insomnia  Smoking cessation She is currently smoking 1PPD. She started Chantix yesterday. Has used Chantix in the past with good success, was smoke-free for a few weeks while using Chantix in the past, but ended up restarting because she had stressful life vents. Cannot tolerate patches and is not interested in gums, lozenges, etc. Quit date is December 1st! She states that she is motivated now more than ever because she was recently told that her smoking is contributing to her risk for breast cancer.  High risk breast cancer Recently rx'd Tamoxifen by Dr. Jana Hakim. She is planning to start this January 1. She is already having hot flashes and is concerned about this medication causing worsening vasomotor symptoms.  Insulin Resistance Wants to restart Trulicity next week. Had significant nausea last time, but only during the first week. She had difficulty remembering to take this medication, but again, is now motivated to make healthier changes and lose weight, start exercising as well. Trialed metformin briefly but could not tolerate due to GI side effects.  Vitamin B12 deficiency Last B12 checked in April  2020 and was low at 224. She has not been consistently doing B12 injections, but is motivated to get back into it. Denies paresthesias.  Lab Results  Component Value Date   VITAMINB12 224 12/10/2018   Anxiety and Depression Currently on Lexapro 10 mg daily. She has not been taking this for at least the past month. She is planning to schedule with Trey Paula soon. Denies SI/HI. She is interested in possible medication change that would also help with vasomotor symptoms.  No flowsheet data found.   Depression screen [facility] 2/9 07/08/2019 12/16/2017  Decreased Interest 2 0  Down, Depressed, Hopeless 2 0  PHQ - 2 Score 4 0  Altered sleeping 1 -  Tired, decreased energy 1 -  Change in appetite 0 -  Feeling bad or failure about yourself  0 -  Trouble concentrating 0 -  Moving slowly or fidgety/restless 0 -  Suicidal thoughts 0 -  PHQ-9 Score 6 -  Difficult doing work/chores Somewhat difficult -       There are no preventive care reminders to display for this patient.  Past Medical History:  Diagnosis Date  . ADD (attention deficit disorder) without hyperactivity   . Anxiety   . Asthma    as a child  . Cervical dysplasia   . Family history of breast cancer   . Family history of lymphoma   . Family history of melanoma   . Headache   . Migraine with aura   . PONV (postoperative nausea and vomiting)   .  Pre-diabetes    gestational diabetes also  . Pure hypercholesterolemia 12/20/2018     Social History   Socioeconomic History  . Marital status: Married    Spouse name: Not on file  . Number of children: Not on file  . Years of education: Not on file  . Highest education level: Not on file  Occupational History  . Not on file  Social Needs  . Financial resource strain: Not on file  . Food insecurity    Worry: Not on file    Inability: Not on file  . Transportation needs    Medical: Not on file    Non-medical: Not on file  Tobacco Use  . Smoking status: Current  Every Day Smoker    Packs/day: 0.25    Years: 10.00    Pack years: 2.50    Types: Cigarettes  . Smokeless tobacco: Never Used  Substance and Sexual Activity  . Alcohol use: Not Currently  . Drug use: No  . Sexual activity: Yes    Birth control/protection: Surgical    Comment: Hysterectomy  Lifestyle  . Physical activity    Days per week: Not on file    Minutes per session: Not on file  . Stress: Not on file  Relationships  . Social Herbalist on phone: Not on file    Gets together: Not on file    Attends religious service: Not on file    Active member of club or organization: Not on file    Attends meetings of clubs or organizations: Not on file    Relationship status: Married  . Intimate partner violence    Fear of current or ex partner: Not on file    Emotionally abused: Not on file    Physically abused: Not on file    Forced sexual activity: Not on file  Other Topics Concern  . Not on file  Social History Narrative  . Not on file    Past Surgical History:  Procedure Laterality Date  . CERVICAL BIOPSY  W/ LOOP ELECTRODE EXCISION    . CESAREAN SECTION     x 1  . CYSTOSCOPY N/A 06/02/2018   Procedure: CYSTOSCOPY;  Surgeon: Salvadore Dom, MD;  Location: [facility];  Service: Gynecology;  Laterality: N/A;  . DILITATION & CURRETTAGE/HYSTROSCOPY WITH NOVASURE ABLATION N/A 04/01/2015   Procedure: DILATATION & CURETTAGE/HYSTEROSCOPY WITH NOVASURE ABLATION;  Surgeon: [name], MD;  Location: [facility];  Service: Gynecology;  Laterality: N/A;  . ENDOMETRIAL ABLATION    . TONSILLECTOMY    . TOTAL LAPAROSCOPIC HYSTERECTOMY WITH SALPINGECTOMY Bilateral 06/02/2018   Procedure: TOTAL LAPAROSCOPIC HYSTERECTOMY WITH SALPINGECTOMY;  Surgeon: Salvadore Dom, MD;  Location: [facility];  Service: Gynecology;  Laterality: Bilateral;  1.5 hours OR time per Dr Talbert Nan. Will need extended recovery bed.  . TUBAL LIGATION       Family History  Problem Relation Age of Onset  . Breast cancer Mother 4       "3x"  . Lung cancer Father   . Breast cancer Maternal Aunt 37  . Lymphoma Paternal Grandmother 41  . Colon cancer Maternal Grandfather 40  . Melanoma Paternal Grandfather 54  . Melanoma Cousin     PMHx, SurgHx, SocialHx, FamHx, Medications, and Allergies were reviewed in the Visit Navigator and updated as appropriate.   Patient Active Problem List   Diagnosis Date Noted  . Anxiety and depression 07/08/2019  . At high risk for breast  cancer 07/06/2019  . Breast cancer screening, high risk patient 07/06/2019  . Elevated hemoglobin ([place]) 12/20/2018  . Vitamin D deficiency 12/20/2018  . Mixed hyperlipidemia 12/20/2018  . Tobacco dependence 06/12/2018  . Status post laparoscopic hysterectomy 06/02/2018  . Insulin resistance   . Genetic testing 03/21/2018  . Family history of breast cancer   . Family history of melanoma   . Family history of lymphoma   . Cervical dysplasia   . 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    Social History   Tobacco Use  . Smoking status: Current Every Day Smoker    Packs/day: 0.25    Years: 10.00    Pack years: 2.50    Types: Cigarettes  . Smokeless tobacco: Never Used  Substance Use Topics  . Alcohol use: Not Currently  . Drug use: No    Current Medications and Allergies:    Current Outpatient Medications:  .  acetaminophen (TYLENOL) 325 MG tablet, Take 2 tablets (650 mg total) by mouth every 4 (four) hours as needed for mild pain., Disp: 30 tablet, Rfl: 0 .  amphetamine-dextroamphetamine (ADDERALL) 20 MG tablet, Take 1 tablet (20 mg total) by mouth 2 (two) times daily., Disp: 60 tablet, Rfl: 0 .  cyanocobalamin (,VITAMIN B-12,) 1000 MCG/ML injection, 1000 mcg (1 mg) injection once per week for four weeks, followed by 1000 mcg injection once per month., Disp: 1 mL, Rfl: 15 .  diazepam (VALIUM) 5 MG tablet, Take 1 tablet (5 mg total) by  mouth every 12 (twelve) hours as needed for anxiety., Disp: 30 tablet, Rfl: 1 .  escitalopram (LEXAPRO) 10 MG tablet, TAKE 1 TABLET(10 MG) BY MOUTH DAILY, Disp: 90 tablet, Rfl: 2 .  lisdexamfetamine (VYVANSE) 70 MG capsule, Take 1 capsule (70 mg total) by mouth daily before breakfast., Disp: 30 capsule, Rfl: 0 .  SUMAtriptan (IMITREX) 25 MG tablet, Take 1 tablet (25 mg total) by mouth every 2 (two) hours as needed for migraine. May repeat in 2 hours if headache persists or recurs., Disp: 10 tablet, Rfl: 0 .  tamoxifen (NOLVADEX) 20 MG tablet, Take 1 tablet (20 mg total) by mouth daily., Disp: 90 tablet, Rfl: 12 .  traZODone (DESYREL) 100 MG tablet, Take 1 tablet (100 mg total) by mouth at bedtime as needed for sleep., Disp: 30 tablet, Rfl: 3 .  tretinoin (RETIN-A) 0.025 % cream, Apply topically at bedtime., Disp: 45 g, Rfl: 3 .  varenicline (CHANTIX) 1 MG tablet, Take 1 mg by mouth 2 (two) times daily., Disp: , Rfl:  .  Dulaglutide (TRULICITY) A999333 0000000 SOPN, 0.75 mg [place] q wk x 2 wks , then increase to 1.5 mg [place] if tolerating (Patient not taking: Reported on 07/08/2019), Disp: 4 pen, Rfl: 2   Allergies  Allergen Reactions  . Penicillins Anaphylaxis, Shortness Of Breath and Other (See Comments)    Tachycardia  Has patient had a PCN reaction causing immediate rash, facial/tongue/throat swelling, SOB or lightheadedness with hypotension: Yes Has patient had a PCN reaction causing severe rash involving mucus membranes or skin necrosis: No Has patient had a PCN reaction that required hospitalization: No Has patient had a PCN reaction occurring within the last 10 years: Yes If all of the above answers are "NO", then may proceed with Cephalosporin use.   . Bupropion Rash    Blister    Review of Systems   ROS Negative unless otherwise specified per HPI.  Vitals:   Vitals:   07/08/19 1534  BP: 130/90  Pulse: 79  Temp: 98 F (36.7 C)  TempSrc: Temporal  SpO2: 98%  Weight: 171 lb (77.6  kg)  Height: 5\' 3"  (1.6 m)     Body mass index is 30.29 kg/m.   Physical Exam:    Physical Exam Vitals signs and nursing note reviewed.  Constitutional:      General: She is not in acute distress.    Appearance: She is well-developed. She is not ill-appearing or toxic-appearing.  Cardiovascular:     Rate and Rhythm: Normal rate and regular rhythm.     Pulses: Normal pulses.     Heart sounds: Normal heart sounds, S1 normal and S2 normal.     Comments: No LE edema Pulmonary:     Effort: Pulmonary effort is normal.     Breath sounds: Normal breath sounds.  Skin:    General: Skin is warm and dry.  Neurological:     Mental Status: She is alert.     GCS: GCS eye subscore is 4. GCS verbal subscore is 5. GCS motor subscore is 6.  Psychiatric:        Speech: Speech normal.        Behavior: Behavior normal. Behavior is cooperative.      Assessment and Plan:    Antonae was seen today for transfer of care and add.  Diagnoses and all orders for this visit:  ADD (attention deficit disorder) without hyperactivity Well controlled on Vyvanse 70 mg daily and very rare prn use of Adderall 20 mg. Continue current medication regimen. [place] Controlled Substance Database reviewed today regarding patient. Patient is compliant with [place] regarding pharmacy use and one-prescribing provider. It is appropriate to continue current medication regimen.  Tobacco dependence Congratulated on starting Chantix and setting quit date. Continue to counsel and provide encouragement. Follow-up in 1 month, sooner if concerns.  Anxiety and depression; At high risk for breast cancer Uncontrolled. Denies SI/HI. I will reach out to her gynecologist, Sumner Boast, on specific recommendations for medication for vasomotor symptoms and her anxiety/depression. Will reach out to patient with recommendations. Commended efforts on trying to start talk therapy soon.  Insulin resistance Restart Trulicity A999333 mg weekly. Due  for recheck of HgbA1c  B12 deficiency Resume B12 injections. -     cyanocobalamin ((VITAMIN B-12)) injection 1,000 mcg  . Reviewed expectations re: course of current medical issues. . Discussed self-management of symptoms. . Outlined signs and symptoms indicating need for more acute intervention. . Patient verbalized understanding and all questions were answered. . See orders for this visit as documented in the electronic medical record. . Patient received an After Visit Summary.  CMA or LPN served as scribe during this visit. History, Physical, and Plan performed by medical provider. The above documentation has been reviewed and is accurate and complete.  Inda Coke, PA-C , [place] 07/08/2019  Follow-up: No follow-ups on file.

## 2019-07-08 ENCOUNTER — Encounter: Payer: Self-pay | Admitting: Physician Assistant

## 2019-07-08 ENCOUNTER — Ambulatory Visit (INDEPENDENT_AMBULATORY_CARE_PROVIDER_SITE_OTHER): Admitting: Physician Assistant

## 2019-07-08 ENCOUNTER — Other Ambulatory Visit: Payer: Self-pay

## 2019-07-08 VITALS — BP 130/90 | HR 79 | Temp 98.0°F | Ht 63.0 in | Wt 171.0 lb

## 2019-07-08 DIAGNOSIS — F988 Other specified behavioral and emotional disorders with onset usually occurring in childhood and adolescence: Secondary | ICD-10-CM | POA: Diagnosis not present

## 2019-07-08 DIAGNOSIS — E538 Deficiency of other specified B group vitamins: Secondary | ICD-10-CM

## 2019-07-08 DIAGNOSIS — F419 Anxiety disorder, unspecified: Secondary | ICD-10-CM | POA: Diagnosis not present

## 2019-07-08 DIAGNOSIS — F329 Major depressive disorder, single episode, unspecified: Secondary | ICD-10-CM

## 2019-07-08 DIAGNOSIS — E8881 Metabolic syndrome: Secondary | ICD-10-CM

## 2019-07-08 DIAGNOSIS — F17200 Nicotine dependence, unspecified, uncomplicated: Secondary | ICD-10-CM

## 2019-07-08 DIAGNOSIS — E88819 Insulin resistance, unspecified: Secondary | ICD-10-CM

## 2019-07-08 DIAGNOSIS — Z9189 Other specified personal risk factors, not elsewhere classified: Secondary | ICD-10-CM

## 2019-07-08 MED ORDER — CYANOCOBALAMIN 1000 MCG/ML IJ SOLN
1000.0000 ug | Freq: Once | INTRAMUSCULAR | Status: AC
  Administered 2019-07-08: 1000 ug via INTRAMUSCULAR

## 2019-07-09 ENCOUNTER — Other Ambulatory Visit (INDEPENDENT_AMBULATORY_CARE_PROVIDER_SITE_OTHER)

## 2019-07-09 DIAGNOSIS — F17200 Nicotine dependence, unspecified, uncomplicated: Secondary | ICD-10-CM

## 2019-07-09 DIAGNOSIS — F419 Anxiety disorder, unspecified: Secondary | ICD-10-CM

## 2019-07-09 DIAGNOSIS — F329 Major depressive disorder, single episode, unspecified: Secondary | ICD-10-CM

## 2019-07-09 DIAGNOSIS — E8881 Metabolic syndrome: Secondary | ICD-10-CM | POA: Diagnosis not present

## 2019-07-09 DIAGNOSIS — E88819 Insulin resistance, unspecified: Secondary | ICD-10-CM

## 2019-07-09 LAB — CBC WITH DIFFERENTIAL/PLATELET
Basophils Absolute: 0 10*3/uL (ref 0.0–0.1)
Basophils Relative: 0.5 % (ref 0.0–3.0)
Eosinophils Absolute: 0.4 10*3/uL (ref 0.0–0.7)
Eosinophils Relative: 4.5 % (ref 0.0–5.0)
HCT: 45.9 % (ref 36.0–46.0)
Hemoglobin: 15.4 g/dL — ABNORMAL HIGH (ref 12.0–15.0)
Lymphocytes Relative: 38.2 % (ref 12.0–46.0)
Lymphs Abs: 3 10*3/uL (ref 0.7–4.0)
MCHC: 33.5 g/dL (ref 30.0–36.0)
MCV: 88 fl (ref 78.0–100.0)
Monocytes Absolute: 0.5 10*3/uL (ref 0.1–1.0)
Monocytes Relative: 6.8 % (ref 3.0–12.0)
Neutro Abs: 4 10*3/uL (ref 1.4–7.7)
Neutrophils Relative %: 50 % (ref 43.0–77.0)
Platelets: 223 10*3/uL (ref 150.0–400.0)
RBC: 5.22 Mil/uL — ABNORMAL HIGH (ref 3.87–5.11)
RDW: 13.5 % (ref 11.5–15.5)
WBC: 7.9 10*3/uL (ref 4.0–10.5)

## 2019-07-09 LAB — COMPREHENSIVE METABOLIC PANEL
ALT: 14 U/L (ref 0–35)
AST: 10 U/L (ref 0–37)
Albumin: 4.5 g/dL (ref 3.5–5.2)
Alkaline Phosphatase: 90 U/L (ref 39–117)
BUN: 11 mg/dL (ref 6–23)
CO2: 28 mEq/L (ref 19–32)
Calcium: 9.3 mg/dL (ref 8.4–10.5)
Chloride: 105 mEq/L (ref 96–112)
Creatinine, Ser: 0.8 mg/dL (ref 0.40–1.20)
GFR: 80.18 mL/min (ref >60.00)
Glucose, Bld: 115 mg/dL — ABNORMAL HIGH (ref 70–99)
Potassium: 4.7 mEq/L (ref 3.5–5.1)
Sodium: 139 mEq/L (ref 135–145)
Total Bilirubin: 0.4 mg/dL (ref 0.2–1.2)
Total Protein: 6.7 g/dL (ref 6.0–8.3)

## 2019-07-09 LAB — LIPID PANEL
Cholesterol: 154 mg/dL (ref 0–200)
HDL: 26.9 mg/dL — ABNORMAL LOW (ref >39.00)
LDL Cholesterol: 112 mg/dL — ABNORMAL HIGH (ref 0–99)
NonHDL: 127.31
Total CHOL/HDL Ratio: 6
Triglycerides: 79 mg/dL (ref 0.0–149.0)
VLDL: 15.8 mg/dL (ref 0.0–40.0)

## 2019-07-09 LAB — HEMOGLOBIN A1C: Hgb A1c MFr Bld: 6.1 % (ref 4.6–6.5)

## 2019-07-20 ENCOUNTER — Other Ambulatory Visit: Payer: Self-pay | Admitting: Family Medicine

## 2019-07-20 ENCOUNTER — Other Ambulatory Visit: Payer: Self-pay

## 2019-07-20 DIAGNOSIS — F411 Generalized anxiety disorder: Secondary | ICD-10-CM

## 2019-07-20 DIAGNOSIS — L918 Other hypertrophic disorders of the skin: Secondary | ICD-10-CM

## 2019-07-20 NOTE — Progress Notes (Signed)
Erroneous encounter

## 2019-07-22 NOTE — Telephone Encounter (Signed)
Last OV 07/08/19 Last refill 05/12/18 #90/2 Next OV not scheduled

## 2019-07-27 ENCOUNTER — Other Ambulatory Visit: Payer: Self-pay

## 2019-07-27 NOTE — Progress Notes (Signed)
GYNECOLOGY  VISIT   HPI: 38 y.o.   Married White or Caucasian Not Hispanic or Latino  female   [phone] with Patient's last menstrual period was 05/07/2018 (exact date).   here for skin tag removal. She has a skin tag on her left upper buttocks that gets tender, caught in her underwear.   GYNECOLOGIC HISTORY: Patient's last menstrual period was 05/07/2018 (exact date). Contraception:Hysterectomy Menopausal hormone therapy: None        OB History    Gravida  6   Para  3   Term  2   Preterm  1   AB  3   Living  3     SAB  3   TAB      Ectopic      Multiple      Live Births  3              Patient Active Problem List   Diagnosis Date Noted  . Anxiety and depression 07/08/2019  . At high risk for breast cancer 07/06/2019  . Breast cancer screening, high risk patient 07/06/2019  . Elevated hemoglobin ([place]) 12/20/2018  . Vitamin D deficiency 12/20/2018  . Mixed hyperlipidemia 12/20/2018  . Tobacco dependence 06/12/2018  . Status post laparoscopic hysterectomy 06/02/2018  . Insulin resistance   . Genetic testing 03/21/2018  . Family history of breast cancer   . Family history of melanoma   . Family history of lymphoma   . Cervical dysplasia   . 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    Past Medical History:  Diagnosis Date  . ADD (attention deficit disorder) without hyperactivity   . Anxiety   . Asthma    as a child  . Cervical dysplasia   . Family history of breast cancer   . Family history of lymphoma   . Family history of melanoma   . Headache   . Migraine with aura   . PONV (postoperative nausea and vomiting)   . Pre-diabetes    gestational diabetes also  . Pure hypercholesterolemia 12/20/2018    Past Surgical History:  Procedure Laterality Date  . CERVICAL BIOPSY  W/ LOOP ELECTRODE EXCISION    . CESAREAN SECTION     x 1  . CYSTOSCOPY N/A 06/02/2018   Procedure: CYSTOSCOPY;  Surgeon: Salvadore Dom, MD;   Location: [facility];  Service: Gynecology;  Laterality: N/A;  . DILITATION & CURRETTAGE/HYSTROSCOPY WITH NOVASURE ABLATION N/A 04/01/2015   Procedure: DILATATION & CURETTAGE/HYSTEROSCOPY WITH NOVASURE ABLATION;  Surgeon: [name], MD;  Location: [facility];  Service: Gynecology;  Laterality: N/A;  . ENDOMETRIAL ABLATION    . TONSILLECTOMY    . TOTAL LAPAROSCOPIC HYSTERECTOMY WITH SALPINGECTOMY Bilateral 06/02/2018   Procedure: TOTAL LAPAROSCOPIC HYSTERECTOMY WITH SALPINGECTOMY;  Surgeon: Salvadore Dom, MD;  Location: [facility];  Service: Gynecology;  Laterality: Bilateral;  1.5 hours OR time per Dr Talbert Nan. Will need extended recovery bed.  . TUBAL LIGATION      Current Outpatient Medications  Medication Sig Dispense Refill  . acetaminophen (TYLENOL) 325 MG tablet Take 2 tablets (650 mg total) by mouth every 4 (four) hours as needed for mild pain. 30 tablet 0  . amphetamine-dextroamphetamine (ADDERALL) 20 MG tablet Take 1 tablet (20 mg total) by mouth 2 (two) times daily. 60 tablet 0  . cyanocobalamin (,VITAMIN B-12,) 1000 MCG/ML injection 1000 mcg (1 mg) injection once per week for four weeks, followed by 1000 mcg injection once per month.  1 mL 15  . diazepam (VALIUM) 5 MG tablet Take 1 tablet (5 mg total) by mouth every 12 (twelve) hours as needed for anxiety. 30 tablet 1  . Dulaglutide (TRULICITY) A999333 0000000 SOPN 0.75 mg [place] q wk x 2 wks , then increase to 1.5 mg [place] if tolerating 4 pen 2  . escitalopram (LEXAPRO) 10 MG tablet TAKE 1 TABLET(10 MG) BY MOUTH DAILY 90 tablet 1  . SUMAtriptan (IMITREX) 25 MG tablet Take 1 tablet (25 mg total) by mouth every 2 (two) hours as needed for migraine. May repeat in 2 hours if headache persists or recurs. 10 tablet 0  . tretinoin (RETIN-A) 0.025 % cream Apply topically at bedtime. 45 g 3  . varenicline (CHANTIX) 1 MG tablet Take 1 mg by mouth 2 (two) times daily.    [place] Kitchen lisdexamfetamine (VYVANSE) 70 MG capsule  Take 1 capsule (70 mg total) by mouth daily before breakfast. 30 capsule 0  . tamoxifen (NOLVADEX) 20 MG tablet Take 1 tablet (20 mg total) by mouth daily. (Patient not taking: Reported on 07/29/2019) 90 tablet 12   No current facility-administered medications for this visit.      ALLERGIES: Penicillins and Bupropion  Family History  Problem Relation Age of Onset  . Breast cancer Mother 55       "3x"  . Lung cancer Father   . Breast cancer Maternal Aunt 48  . Lymphoma Paternal Grandmother 24  . Colon cancer Maternal Grandfather 11  . Melanoma Paternal Grandfather 61  . Melanoma Cousin     Social History   Socioeconomic History  . Marital status: Married    Spouse name: Not on file  . Number of children: Not on file  . Years of education: Not on file  . Highest education level: Not on file  Occupational History  . Not on file  Social Needs  . Financial resource strain: Not on file  . Food insecurity    Worry: Not on file    Inability: Not on file  . Transportation needs    Medical: Not on file    Non-medical: Not on file  Tobacco Use  . Smoking status: Current Every Day Smoker    Packs/day: 0.25    Years: 10.00    Pack years: 2.50    Types: Cigarettes  . Smokeless tobacco: Never Used  Substance and Sexual Activity  . Alcohol use: Not Currently  . Drug use: No  . Sexual activity: Yes    Birth control/protection: Surgical    Comment: Hysterectomy  Lifestyle  . Physical activity    Days per week: Not on file    Minutes per session: Not on file  . Stress: Not on file  Relationships  . Social Herbalist on phone: Not on file    Gets together: Not on file    Attends religious service: Not on file    Active member of club or organization: Not on file    Attends meetings of clubs or organizations: Not on file    Relationship status: Married  . Intimate partner violence    Fear of current or ex partner: Not on file    Emotionally abused: Not on file     Physically abused: Not on file    Forced sexual activity: Not on file  Other Topics Concern  . Not on file  Social History Narrative  . Not on file    Review of Systems  Constitutional: Negative.  HENT: Negative.   Eyes: Negative.   Respiratory: Negative.   Cardiovascular: Negative.   Gastrointestinal: Negative.   Genitourinary: Negative.   Musculoskeletal: Negative.   Skin: Negative.   Neurological: Negative.   Endo/Heme/Allergies: Negative.   Psychiatric/Behavioral: Negative.     PHYSICAL EXAMINATION:    BP 110/80 (BP Location: Right Arm, Patient Position: Sitting, Cuff Size: Large)   Pulse 72   Temp (!) 97.2 F (36.2 C) (Skin)   Wt 173 lb (78.5 kg)   LMP 05/07/2018 (Exact Date)   BMI 30.65 kg/m     General appearance: alert, cooperative and appears stated age  Pelvic: External genitalia:  On the upper inner left buttock is a skin colored lesion, ~7 mm, ? Skin tag vs nevus  The risks of the procedure were reviewed with the patient and a consent was signed. The area was cleansed with betadine and injected with 1% lidocaine. A  #11 blade was used to remove the lesion. The defect was closed with 2 simple stitches with 4-0 vicryl. The patient tolerated the procedure well.                 Chaperone was present for exam.  ASSESSMENT Skin lesion, tag vs nevus    PLAN Removed, sent to pathology Routine f/u.   An After Visit Summary was printed and given to the patient.

## 2019-07-29 ENCOUNTER — Encounter: Payer: Self-pay | Admitting: Obstetrics and Gynecology

## 2019-07-29 ENCOUNTER — Other Ambulatory Visit: Payer: Self-pay

## 2019-07-29 ENCOUNTER — Telehealth: Payer: Self-pay | Admitting: Obstetrics and Gynecology

## 2019-07-29 ENCOUNTER — Other Ambulatory Visit (HOSPITAL_COMMUNITY)
Admission: RE | Admit: 2019-07-29 | Discharge: 2019-07-29 | Disposition: A | Discharge status: Home / Self Care | Source: Ambulatory Visit | Attending: Obstetrics and Gynecology | Admitting: Obstetrics and Gynecology

## 2019-07-29 ENCOUNTER — Ambulatory Visit (INDEPENDENT_AMBULATORY_CARE_PROVIDER_SITE_OTHER): Admitting: Obstetrics and Gynecology

## 2019-07-29 VITALS — BP 110/80 | HR 72 | Temp 97.2°F | Wt 173.0 lb

## 2019-07-29 DIAGNOSIS — L989 Disorder of the skin and subcutaneous tissue, unspecified: Secondary | ICD-10-CM | POA: Insufficient documentation

## 2019-07-29 NOTE — Telephone Encounter (Signed)
Patient returned call. See account notes. Patient is aware of benefit information for appointment scheduled for 07/29/2019 and is agreeable. Will close encounter.

## 2019-07-29 NOTE — Telephone Encounter (Signed)
Call placed to patient regarding benefit for scheduled appointment. Left voicemail message requesting a return call/

## 2019-07-31 LAB — SURGICAL PATHOLOGY

## 2019-08-05 ENCOUNTER — Encounter: Payer: Self-pay | Admitting: Physician Assistant

## 2019-08-05 ENCOUNTER — Ambulatory Visit (INDEPENDENT_AMBULATORY_CARE_PROVIDER_SITE_OTHER): Admitting: Physician Assistant

## 2019-08-05 VITALS — BP 150/90 | HR 73 | Temp 98.2°F | Ht 63.0 in | Wt 172.0 lb

## 2019-08-05 DIAGNOSIS — F411 Generalized anxiety disorder: Secondary | ICD-10-CM | POA: Diagnosis not present

## 2019-08-05 DIAGNOSIS — E8881 Metabolic syndrome: Secondary | ICD-10-CM | POA: Diagnosis not present

## 2019-08-05 DIAGNOSIS — E538 Deficiency of other specified B group vitamins: Secondary | ICD-10-CM

## 2019-08-05 DIAGNOSIS — F17210 Nicotine dependence, cigarettes, uncomplicated: Secondary | ICD-10-CM

## 2019-08-05 MED ORDER — RYBELSUS 3 MG PO TABS: 3.0000 mg | ORAL_TABLET | Freq: Every day | ORAL | 1 refills | Status: DC

## 2019-08-05 MED ORDER — LISDEXAMFETAMINE DIMESYLATE 70 MG PO CAPS: 70.0000 mg | ORAL_CAPSULE | Freq: Every day | ORAL | 0 refills | Status: DC

## 2019-08-05 MED ORDER — VARENICLINE TARTRATE 1 MG PO TABS: 1.0000 mg | ORAL_TABLET | Freq: Two times a day (BID) | ORAL | 0 refills | Status: AC

## 2019-08-05 MED ORDER — ESCITALOPRAM OXALATE 10 MG PO TABS: ORAL_TABLET | ORAL | 1 refills | Status: DC

## 2019-08-05 MED ORDER — CYANOCOBALAMIN 1000 MCG/ML IJ SOLN
1000.0000 ug | Freq: Once | INTRAMUSCULAR | Status: AC
  Administered 2019-08-05: 12:00:00 1000 ug via INTRAMUSCULAR

## 2019-08-05 NOTE — Progress Notes (Signed)
Haley Mcdonald is a 38 y.o. female is here to discuss: Medication refill.  I acted as a Education administrator for [employer], PA-C Anselmo Pickler, LPN  History of Present Illness:   Chief Complaint  Patient presents with  . ADD  . Medication Refill    HPI   ADHD Pt is here for follow up, currently taking Vyvanse 70 mg daily. Also takes Adderall 20 mg as needed. This medication works well for her. She denies: palpitations, worsening anxiety or medication, poor appetite, chest pain.  Insulin resistance Started on Trulicity [id number] mg weekly. She has been on this for about 3-4 weeks. Has had increase in bloating, gas, constipation, nausea and overall GI distress. She cannot tolerate metformin. Is interested in rybelsus.  Tobacco abuse Was doing well with Chantix but then forgot to take for a several days so she is smoking more. She is overall smoking less than she was prior to starting Chantix. She is interested in resuming chantix.  Anxiety and depression Currently prescribed Lexapro 10 mg daily but is not taking this consistently. She would like to restart this. Denies SI/HI.  B12 deficiency Due for injection today. She is able to do these on her own if needed.  There are no preventive care reminders to display for this patient.  Past Medical History:  Diagnosis Date  . ADD (attention deficit disorder) without hyperactivity   . Anxiety   . Asthma    as a child  . Cervical dysplasia   . Family history of breast cancer   . Family history of lymphoma   . Family history of melanoma   . Headache   . Migraine with aura   . PONV (postoperative nausea and vomiting)   . Pre-diabetes    gestational diabetes also  . Pure hypercholesterolemia 12/20/2018     Social History   Socioeconomic History  . Marital status: Married    Spouse name: Not on file  . Number of children: Not on file  . Years of education: Not on file  . Highest education level: Not on file  Occupational  History  . Not on file  Tobacco Use  . Smoking status: Current Every Day Smoker    Packs/day: 0.25    Years: 10.00    Pack years: 2.50    Types: Cigarettes  . Smokeless tobacco: Never Used  Substance and Sexual Activity  . Alcohol use: Not Currently  . Drug use: No  . Sexual activity: Yes    Birth control/protection: Surgical    Comment: Hysterectomy  Other Topics Concern  . Not on file  Social History Narrative  . Not on file   Social Determinants of Health   Financial Resource Strain:   . Difficulty of Paying Living Expenses: Not on file  Food Insecurity:   . Worried About Charity fundraiser in the Last Year: Not on file  . Ran Out of Food in the Last Year: Not on file  Transportation Needs:   . Lack of Transportation (Medical): Not on file  . Lack of Transportation (Non-Medical): Not on file  Physical Activity:   . Days of Exercise per Week: Not on file  . Minutes of Exercise per Session: Not on file  Stress:   . Feeling of Stress : Not on file  Social Connections:   . Frequency of Communication with Friends and Family: Not on file  . Frequency of Social Gatherings with Friends and Family: Not on file  . Attends Religious  Services: Not on file  . Active Member of Clubs or Organizations: Not on file  . Attends Archivist Meetings: Not on file  . Marital Status: Not on file  Intimate Partner Violence:   . Fear of Current or Ex-Partner: Not on file  . Emotionally Abused: Not on file  . Physically Abused: Not on file  . Sexually Abused: Not on file    Past Surgical History:  Procedure Laterality Date  . CERVICAL BIOPSY  W/ LOOP ELECTRODE EXCISION    . CESAREAN SECTION     x 1  . CYSTOSCOPY N/A 06/02/2018   Procedure: CYSTOSCOPY;  Surgeon: Salvadore Dom, MD;  Location: [facility];  Service: Gynecology;  Laterality: N/A;  . DILITATION & CURRETTAGE/HYSTROSCOPY WITH NOVASURE ABLATION N/A 04/01/2015   Procedure: DILATATION &  CURETTAGE/HYSTEROSCOPY WITH NOVASURE ABLATION;  Surgeon: [name], MD;  Location: [facility];  Service: Gynecology;  Laterality: N/A;  . ENDOMETRIAL ABLATION    . TONSILLECTOMY    . TOTAL LAPAROSCOPIC HYSTERECTOMY WITH SALPINGECTOMY Bilateral 06/02/2018   Procedure: TOTAL LAPAROSCOPIC HYSTERECTOMY WITH SALPINGECTOMY;  Surgeon: Salvadore Dom, MD;  Location: [facility];  Service: Gynecology;  Laterality: Bilateral;  1.5 hours OR time per Dr Talbert Nan. Will need extended recovery bed.  . TUBAL LIGATION      Family History  Problem Relation Age of Onset  . Breast cancer Mother 23       "3x"  . Lung cancer Father   . Breast cancer Maternal Aunt 31  . Lymphoma Paternal Grandmother 75  . Colon cancer Maternal Grandfather 106  . Melanoma Paternal Grandfather 49  . Melanoma Cousin     PMHx, SurgHx, SocialHx, FamHx, Medications, and Allergies were reviewed in the Visit Navigator and updated as appropriate.   Patient Active Problem List   Diagnosis Date Noted  . Anxiety and depression 07/08/2019  . At high risk for breast cancer 07/06/2019  . Breast cancer screening, high risk patient 07/06/2019  . Elevated hemoglobin ([place]) 12/20/2018  . Vitamin D deficiency 12/20/2018  . Mixed hyperlipidemia 12/20/2018  . Tobacco dependence 06/12/2018  . Status post laparoscopic hysterectomy 06/02/2018  . Insulin resistance   . Genetic testing 03/21/2018  . Family history of breast cancer   . Family history of melanoma   . Family history of lymphoma   . Cervical dysplasia   . 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    Social History   Tobacco Use  . Smoking status: Current Every Day Smoker    Packs/day: 0.25    Years: 10.00    Pack years: 2.50    Types: Cigarettes  . Smokeless tobacco: Never Used  Substance Use Topics  . Alcohol use: Not Currently  . Drug use: No    Current Medications and Allergies:    Current Outpatient Medications:    .  acetaminophen (TYLENOL) 325 MG tablet, Take 2 tablets (650 mg total) by mouth every 4 (four) hours as needed for mild pain., Disp: 30 tablet, Rfl: 0 .  amphetamine-dextroamphetamine (ADDERALL) 20 MG tablet, Take 1 tablet (20 mg total) by mouth 2 (two) times daily., Disp: 60 tablet, Rfl: 0 .  cyanocobalamin (,VITAMIN B-12,) 1000 MCG/ML injection, 1000 mcg (1 mg) injection once per week for four weeks, followed by 1000 mcg injection once per month., Disp: 1 mL, Rfl: 15 .  diazepam (VALIUM) 5 MG tablet, Take 1 tablet (5 mg total) by mouth every 12 (twelve) hours as needed for anxiety.,  Disp: 30 tablet, Rfl: 1 .  escitalopram (LEXAPRO) 10 MG tablet, TAKE 1 TABLET(10 MG) BY MOUTH DAILY, Disp: 90 tablet, Rfl: 1 .  SUMAtriptan (IMITREX) 25 MG tablet, Take 1 tablet (25 mg total) by mouth every 2 (two) hours as needed for migraine. May repeat in 2 hours if headache persists or recurs., Disp: 10 tablet, Rfl: 0 .  varenicline (CHANTIX) 1 MG tablet, Take 1 tablet (1 mg total) by mouth 2 (two) times daily., Disp: 60 tablet, Rfl: 0 .  lisdexamfetamine (VYVANSE) 70 MG capsule, Take 1 capsule (70 mg total) by mouth daily before breakfast., Disp: 30 capsule, Rfl: 0 .  [START ON 09/04/2019] lisdexamfetamine (VYVANSE) 70 MG capsule, Take 1 capsule (70 mg total) by mouth daily., Disp: 30 capsule, Rfl: 0 .  [START ON 10/04/2019] lisdexamfetamine (VYVANSE) 70 MG capsule, Take 1 capsule (70 mg total) by mouth daily., Disp: 30 capsule, Rfl: 0 .  Semaglutide (RYBELSUS) 3 MG TABS, Take 3 mg by mouth daily., Disp: 30 tablet, Rfl: 1 .  tamoxifen (NOLVADEX) 20 MG tablet, Take 1 tablet (20 mg total) by mouth daily. (Patient not taking: Reported on 07/29/2019), Disp: 90 tablet, Rfl: 12   Allergies  Allergen Reactions  . Penicillins Anaphylaxis, Shortness Of Breath and Other (See Comments)    Tachycardia  Has patient had a PCN reaction causing immediate rash, facial/tongue/throat swelling, SOB or lightheadedness with  hypotension: Yes Has patient had a PCN reaction causing severe rash involving mucus membranes or skin necrosis: No Has patient had a PCN reaction that required hospitalization: No Has patient had a PCN reaction occurring within the last 10 years: Yes If all of the above answers are "NO", then may proceed with Cephalosporin use.   . Bupropion Rash    Blister    Review of Systems   ROS  Negative unless otherwise specified per HPI.  Vitals:   Vitals:   08/05/19 1119  BP: (!) 150/90  Pulse: 73  Temp: 98.2 F (36.8 C)  TempSrc: Temporal  SpO2: 97%  Weight: 172 lb (78 kg)  Height: 5\' 3"  (1.6 m)     Body mass index is 30.47 kg/m.   Physical Exam:    Physical Exam Vitals and nursing note reviewed.  Constitutional:      General: She is not in acute distress.    Appearance: She is well-developed. She is not ill-appearing or toxic-appearing.  Cardiovascular:     Rate and Rhythm: Normal rate and regular rhythm.     Pulses: Normal pulses.     Heart sounds: Normal heart sounds, S1 normal and S2 normal.     Comments: No LE edema Pulmonary:     Effort: Pulmonary effort is normal.     Breath sounds: Normal breath sounds.  Skin:    General: Skin is warm and dry.  Neurological:     Mental Status: She is alert.     GCS: GCS eye subscore is 4. GCS verbal subscore is 5. GCS motor subscore is 6.  Psychiatric:        Speech: Speech normal.        Behavior: Behavior normal. Behavior is cooperative.      Assessment and Plan:    Haley Mcdonald was seen today for add and medication refill.  Diagnoses and all orders for this visit:  B12 deficiency Due for injection today. Recommend weekly x 3 weeks and monthly x 3 months. -     cyanocobalamin ((VITAMIN B-12)) injection 1,000 mcg  GAD (generalized anxiety  disorder) Refill lexapro. Encouraged compliance. Follow-up in 6 months, sooner if concerns. Orders: -     escitalopram (LEXAPRO) 10 MG tablet; TAKE 1 TABLET(10 MG) BY MOUTH  DAILY  Cigarette nicotine dependence without complication Encouraged continued efforts. Refill chantix.  Insulin resistance Unable to tolerate Trulicity due to GI side effects -- will discontinue. Will trial 3 mg Rybelsus, asked her to check in with me in 1 month to let me know how she is doing on this medication.   Other orders -     varenicline (CHANTIX) 1 MG tablet; Take 1 tablet (1 mg total) by mouth 2 (two) times daily. -     lisdexamfetamine (VYVANSE) 70 MG capsule; Take 1 capsule (70 mg total) by mouth daily before breakfast. -     lisdexamfetamine (VYVANSE) 70 MG capsule; Take 1 capsule (70 mg total) by mouth daily. -     lisdexamfetamine (VYVANSE) 70 MG capsule; Take 1 capsule (70 mg total) by mouth daily. -     Semaglutide (RYBELSUS) 3 MG TABS; Take 3 mg by mouth daily.   . Reviewed expectations re: course of current medical issues. . Discussed self-management of symptoms. . Outlined signs and symptoms indicating need for more acute intervention. . Patient verbalized understanding and all questions were answered. . See orders for this visit as documented in the electronic medical record. . Patient received an After Visit Summary.  CMA or LPN served as scribe during this visit. History, Physical, and Plan performed by medical provider. The above documentation has been reviewed and is accurate and complete.   Inda Coke, PA-C [place], [place] 08/05/2019  Follow-up: No follow-ups on file.

## 2019-09-01 ENCOUNTER — Ambulatory Visit
Admission: RE | Admit: 2019-09-01 | Discharge: 2019-09-01 | Disposition: A | Discharge status: Home / Self Care | Source: Ambulatory Visit | Attending: Oncology | Admitting: Oncology

## 2019-09-01 ENCOUNTER — Other Ambulatory Visit: Payer: Self-pay

## 2019-09-01 DIAGNOSIS — Z1239 Encounter for other screening for malignant neoplasm of breast: Secondary | ICD-10-CM

## 2019-09-01 DIAGNOSIS — Z9189 Other specified personal risk factors, not elsewhere classified: Secondary | ICD-10-CM

## 2019-09-23 ENCOUNTER — Ambulatory Visit (INDEPENDENT_AMBULATORY_CARE_PROVIDER_SITE_OTHER): Admitting: Physician Assistant

## 2019-09-23 ENCOUNTER — Encounter: Payer: Self-pay | Admitting: Physician Assistant

## 2019-09-23 ENCOUNTER — Other Ambulatory Visit: Payer: Self-pay

## 2019-09-23 VITALS — BP 138/100 | HR 91 | Temp 98.1°F | Ht 63.0 in | Wt 170.0 lb

## 2019-09-23 DIAGNOSIS — I10 Essential (primary) hypertension: Secondary | ICD-10-CM | POA: Insufficient documentation

## 2019-09-23 DIAGNOSIS — F17200 Nicotine dependence, unspecified, uncomplicated: Secondary | ICD-10-CM

## 2019-09-23 DIAGNOSIS — G43009 Migraine without aura, not intractable, without status migrainosus: Secondary | ICD-10-CM | POA: Insufficient documentation

## 2019-09-23 DIAGNOSIS — E538 Deficiency of other specified B group vitamins: Secondary | ICD-10-CM | POA: Insufficient documentation

## 2019-09-23 DIAGNOSIS — R400 Somnolence: Secondary | ICD-10-CM | POA: Diagnosis not present

## 2019-09-23 MED ORDER — RIZATRIPTAN BENZOATE 5 MG PO TABS: 5.0000 mg | ORAL_TABLET | ORAL | 0 refills | Status: DC | PRN

## 2019-09-23 MED ORDER — CYANOCOBALAMIN 1000 MCG/ML IJ SOLN
1000.0000 ug | Freq: Once | INTRAMUSCULAR | Status: AC
  Administered 2019-09-23: 1000 ug via INTRAMUSCULAR

## 2019-09-23 MED ORDER — CHLORTHALIDONE 25 MG PO TABS: ORAL_TABLET | ORAL | 0 refills | Status: DC

## 2019-09-23 NOTE — Progress Notes (Signed)
Haley Mcdonald is a 39 y.o. female here for a new problem.  I acted as a Education administrator for [employer], PA-C [employer], LPN  History of Present Illness:   Chief Complaint  Patient presents with  . c/o elevated blood pressure    HPI   Elevated blood pressure Pt c/o elevated blood pressure for past few months. She has had increase in headaches and some blurred vision when her headache is severe, lower leg and hand edema.  Blood pressure has been in the 140s to 150s over 100s.    Patient denies chest pain, SOB, blurred vision, dizziness. Does eat fast food and drink Dr. Malachi Bonds regularly. Reports daytime fatigue and concerns for possible sleep apnea.  BP Readings from Last 3 Encounters:  09/23/19 (!) 138/100  08/05/19 (!) 150/90  07/29/19 110/80   Vitamin B12 Deficiency Due for vitamin B12 injection today.  She is not taking an oral supplement.  She denies paresthesias.  Tobacco abuse Did well with Chantix at one point, is interested in possibly resuming medication.  Migraines Reports recent increase in migraines, attributes this to stress.  She has had 1 a week.  She feels as though the Imitrex is very sedating when she takes it.  She is looking for a different abortive medication that can help her control symptoms while working.  When she has her migraines, she becomes sensitive to light and noise and often develops nausea, with sometimes vomiting.  Denies any changes in her migraines other than frequency.    Past Medical History:  Diagnosis Date  . ADD (attention deficit disorder) without hyperactivity   . Anxiety   . Asthma    as a child  . Cervical dysplasia   . Family history of breast cancer   . Family history of lymphoma   . Family history of melanoma   . Headache   . Migraine with aura   . PONV (postoperative nausea and vomiting)   . Pre-diabetes    gestational diabetes also  . Pure hypercholesterolemia 12/20/2018     Social History   Socioeconomic  History  . Marital status: Married    Spouse name: Not on file  . Number of children: Not on file  . Years of education: Not on file  . Highest education level: Not on file  Occupational History  . Not on file  Tobacco Use  . Smoking status: Current Every Day Smoker    Packs/day: 0.25    Years: 10.00    Pack years: 2.50    Types: Cigarettes  . Smokeless tobacco: Never Used  Substance and Sexual Activity  . Alcohol use: Not Currently  . Drug use: No  . Sexual activity: Yes    Birth control/protection: Surgical    Comment: Hysterectomy  Other Topics Concern  . Not on file  Social History Narrative  . Not on file   Social Determinants of Health   Financial Resource Strain:   . Difficulty of Paying Living Expenses: Not on file  Food Insecurity:   . Worried About Charity fundraiser in the Last Year: Not on file  . Ran Out of Food in the Last Year: Not on file  Transportation Needs:   . Lack of Transportation (Medical): Not on file  . Lack of Transportation (Non-Medical): Not on file  Physical Activity:   . Days of Exercise per Week: Not on file  . Minutes of Exercise per Session: Not on file  Stress:   . Feeling  of Stress : Not on file  Social Connections:   . Frequency of Communication with Friends and Family: Not on file  . Frequency of Social Gatherings with Friends and Family: Not on file  . Attends Religious Services: Not on file  . Active Member of Clubs or Organizations: Not on file  . Attends Archivist Meetings: Not on file  . Marital Status: Not on file  Intimate Partner Violence:   . Fear of Current or Ex-Partner: Not on file  . Emotionally Abused: Not on file  . Physically Abused: Not on file  . Sexually Abused: Not on file    Past Surgical History:  Procedure Laterality Date  . CERVICAL BIOPSY  W/ LOOP ELECTRODE EXCISION    . CESAREAN SECTION     x 1  . CYSTOSCOPY N/A 06/02/2018   Procedure: CYSTOSCOPY;  Surgeon: Salvadore Dom,  MD;  Location: [facility];  Service: Gynecology;  Laterality: N/A;  . DILITATION & CURRETTAGE/HYSTROSCOPY WITH NOVASURE ABLATION N/A 04/01/2015   Procedure: DILATATION & CURETTAGE/HYSTEROSCOPY WITH NOVASURE ABLATION;  Surgeon: [name], MD;  Location: [facility];  Service: Gynecology;  Laterality: N/A;  . ENDOMETRIAL ABLATION    . TONSILLECTOMY    . TOTAL LAPAROSCOPIC HYSTERECTOMY WITH SALPINGECTOMY Bilateral 06/02/2018   Procedure: TOTAL LAPAROSCOPIC HYSTERECTOMY WITH SALPINGECTOMY;  Surgeon: Salvadore Dom, MD;  Location[facility];  Service: Gynecology;  Laterality: Bilateral;  1.5 hours OR time per Dr Talbert Nan. Will need extended recovery bed.  . TUBAL LIGATION      Family History  Problem Relation Age of Onset  . Breast cancer Mother 20       "3x"  . Lung cancer Father   . Breast cancer Maternal Aunt 29  . Lymphoma Paternal Grandmother 53  . Colon cancer Maternal Grandfather 66  . Melanoma Paternal Grandfather 4  . Melanoma Cousin     Allergies  Allergen Reactions  . Penicillins Anaphylaxis, Shortness Of Breath and Other (See Comments)    Tachycardia  Has patient had a PCN reaction causing immediate rash, facial/tongue/throat swelling, SOB or lightheadedness with hypotension: Yes Has patient had a PCN reaction causing severe rash involving mucus membranes or skin necrosis: No Has patient had a PCN reaction that required hospitalization: No Has patient had a PCN reaction occurring within the last 10 years: Yes If all of the above answers are "NO", then may proceed with Cephalosporin use.   . Bupropion Rash    Blister    Current Medications:   Current Outpatient Medications:  .  acetaminophen (TYLENOL) 325 MG tablet, Take 2 tablets (650 mg total) by mouth every 4 (four) hours as needed for mild pain., Disp: 30 tablet, Rfl: 0 .  amphetamine-dextroamphetamine (ADDERALL) 20 MG tablet, Take 1 tablet (20 mg total) by mouth 2 (two) times  daily., Disp: 60 tablet, Rfl: 0 .  cyanocobalamin (,VITAMIN B-12,) 1000 MCG/ML injection, 1000 mcg (1 mg) injection once per week for four weeks, followed by 1000 mcg injection once per month., Disp: 1 mL, Rfl: 15 .  diazepam (VALIUM) 5 MG tablet, Take 1 tablet (5 mg total) by mouth every 12 (twelve) hours as needed for anxiety., Disp: 30 tablet, Rfl: 1 .  escitalopram (LEXAPRO) 10 MG tablet, TAKE 1 TABLET(10 MG) BY MOUTH DAILY, Disp: 90 tablet, Rfl: 1 .  lisdexamfetamine (VYVANSE) 70 MG capsule, Take 1 capsule (70 mg total) by mouth daily., Disp: 30 capsule, Rfl: 0 .  [START ON 10/04/2019] lisdexamfetamine (VYVANSE)  70 MG capsule, Take 1 capsule (70 mg total) by mouth daily., Disp: 30 capsule, Rfl: 0 .  SUMAtriptan (IMITREX) 25 MG tablet, Take 1 tablet (25 mg total) by mouth every 2 (two) hours as needed for migraine. May repeat in 2 hours if headache persists or recurs., Disp: 10 tablet, Rfl: 0 .  chlorthalidone (HYGROTON) 25 MG tablet, Take 1/2 tablet daily, Disp: 30 tablet, Rfl: 0 .  lisdexamfetamine (VYVANSE) 70 MG capsule, Take 1 capsule (70 mg total) by mouth daily before breakfast., Disp: 30 capsule, Rfl: 0 .  rizatriptan (MAXALT) 5 MG tablet, Take 1 tablet (5 mg total) by mouth as needed for migraine. May repeat in 2 hours if needed, Disp: 10 tablet, Rfl: 0   Review of Systems:   ROS  Vitals:   Vitals:   09/23/19 1312  BP: (!) 138/100  Pulse: 91  Temp: 98.1 F (36.7 C)  TempSrc: Temporal  SpO2: 96%  Weight: 170 lb (77.1 kg)  Height: 5\' 3"  (1.6 m)     Body mass index is 30.11 kg/m.  Physical Exam:   Physical Exam Vitals and nursing note reviewed.  Constitutional:      General: She is not in acute distress.    Appearance: She is well-developed. She is not ill-appearing or toxic-appearing.  HENT:     Head: Normocephalic and atraumatic.     Right Ear: Tympanic membrane, ear canal and external ear normal. Tympanic membrane is not erythematous, retracted or bulging.      Left Ear: Tympanic membrane, ear canal and external ear normal. Tympanic membrane is not erythematous, retracted or bulging.     Nose: Nose normal.     Right Sinus: No maxillary sinus tenderness or frontal sinus tenderness.     Left Sinus: No maxillary sinus tenderness or frontal sinus tenderness.     Mouth/Throat:     Pharynx: Uvula midline. No posterior oropharyngeal erythema.  Eyes:     General: Lids are normal.     Conjunctiva/sclera: Conjunctivae normal.  Neck:     Trachea: Trachea normal.  Cardiovascular:     Rate and Rhythm: Normal rate and regular rhythm.     Heart sounds: Normal heart sounds, S1 normal and S2 normal.     Comments: Trace b/l edema Pulmonary:     Effort: Pulmonary effort is normal.     Breath sounds: Normal breath sounds. No decreased breath sounds, wheezing, rhonchi or rales.  Lymphadenopathy:     Cervical: No cervical adenopathy.  Skin:    General: Skin is warm and dry.  Neurological:     Mental Status: She is alert.  Psychiatric:        Speech: Speech normal.        Behavior: Behavior normal. Behavior is cooperative.       Assessment and Plan:   Camani was seen today for c/o elevated blood pressure.  Diagnoses and all orders for this visit:  Essential hypertension New diagnosis.  Suspect this is due to stress, lifestyle choices, as well as smoking.  Discussed starting chlorthalidone 25 mg daily, we will start with half a tablet, 12.5 mg, daily.  Follow-up in 1 month, sooner if concerns.  No red flags on discussion.  Also monitor BP at home and follow-up sooner if acute concerns. -     Ambulatory referral to Sleep Studies  B12 deficiency -     cyanocobalamin ((VITAMIN B-12)) injection 1,000 mcg  Daytime somnolence Suspect possible sleep apnea, due to elevated diastolic, as well  as ongoing daytime somnolence.  She is agreeable to sleep study, and would be agreeable to trying CPAP if indicated. -     Ambulatory referral to Sleep  Studies  Tobacco dependence Encouraged cessation.  Migraine without aura and without status migrainosus, not intractable No red flags on exam.  Discontinue Imitrex and trial Maxalt.  Follow-up if ineffective or if develops unpleasant side effects with this medication.  Also discussed if frequency persists, may need to start preventive medication.  Other orders -     chlorthalidone (HYGROTON) 25 MG tablet; Take 1/2 tablet daily -     rizatriptan (MAXALT) 5 MG tablet; Take 1 tablet (5 mg total) by mouth as needed for migraine. May repeat in 2 hours if needed  Reviewed expectations re: course of current medical issues. . Discussed self-management of symptoms. . Outlined signs and symptoms indicating need for more acute intervention. . Patient verbalized understanding and all questions were answered. . See orders for this visit as documented in the electronic medical record. . Patient received an After-Visit Summary.  CMA or LPN served as scribe during this visit. History, Physical, and Plan performed by medical provider. The above documentation has been reviewed and is accurate and complete.   Inda Coke, PA-C

## 2019-09-29 ENCOUNTER — Other Ambulatory Visit: Payer: Self-pay

## 2019-09-29 ENCOUNTER — Ambulatory Visit (INDEPENDENT_AMBULATORY_CARE_PROVIDER_SITE_OTHER): Admitting: Physician Assistant

## 2019-09-29 ENCOUNTER — Encounter: Payer: Self-pay | Admitting: Physician Assistant

## 2019-09-29 VITALS — BP 122/90 | HR 88 | Wt 168.0 lb

## 2019-09-29 DIAGNOSIS — R000 Tachycardia, unspecified: Secondary | ICD-10-CM | POA: Diagnosis not present

## 2019-09-29 MED ORDER — PROPRANOLOL HCL 20 MG PO TABS: 20.0000 mg | ORAL_TABLET | Freq: Two times a day (BID) | ORAL | 0 refills | Status: DC | PRN

## 2019-09-29 NOTE — Progress Notes (Signed)
Haley Mcdonald is a 39 y.o. female here for a new problem.  I acted as a Education administrator for [employer], PA-C Anselmo Pickler, LPN  History of Present Illness:   Chief Complaint  Patient presents with  . Tachycardia    HPI   Tachycardia Pt c/o fast heart rate x 2 weeks. Occurs with sitting and relaxing. Denies chest pain, tightness. She is currently smoking about 4 cigarettes daily. She is taking 70 mg Vyvanse daily but this is a normal dose for her that she has been on for quite some time. Drinks between 16-24 oz caffeine daily. Has recently tried to increase her water intake but this did not make a difference.  She is going for assessment for OSA tomorrow at [facility].  Denies: hx blood clots, abdominal pain, nausea, unusual headaches  Past Medical History:  Diagnosis Date  . ADD (attention deficit disorder) without hyperactivity   . Anxiety   . Asthma    as a child  . Cervical dysplasia   . Family history of breast cancer   . Family history of lymphoma   . Family history of melanoma   . Headache   . Migraine with aura   . PONV (postoperative nausea and vomiting)   . Pre-diabetes    gestational diabetes also  . Pure hypercholesterolemia 12/20/2018     Social History   Socioeconomic History  . Marital status: Married    Spouse name: Not on file  . Number of children: Not on file  . Years of education: Not on file  . Highest education level: Not on file  Occupational History  . Not on file  Tobacco Use  . Smoking status: Current Every Day Smoker    Packs/day: 0.25    Years: 10.00    Pack years: 2.50    Types: Cigarettes  . Smokeless tobacco: Never Used  Substance and Sexual Activity  . Alcohol use: Not Currently  . Drug use: No  . Sexual activity: Yes    Birth control/protection: Surgical    Comment: Hysterectomy  Other Topics Concern  . Not on file  Social History Narrative  . Not on file   Social Determinants of Health   Financial Resource Strain:    . Difficulty of Paying Living Expenses: Not on file  Food Insecurity:   . Worried About Charity fundraiser in the Last Year: Not on file  . Ran Out of Food in the Last Year: Not on file  Transportation Needs:   . Lack of Transportation (Medical): Not on file  . Lack of Transportation (Non-Medical): Not on file  Physical Activity:   . Days of Exercise per Week: Not on file  . Minutes of Exercise per Session: Not on file  Stress:   . Feeling of Stress : Not on file  Social Connections:   . Frequency of Communication with Friends and Family: Not on file  . Frequency of Social Gatherings with Friends and Family: Not on file  . Attends Religious Services: Not on file  . Active Member of Clubs or Organizations: Not on file  . Attends Archivist Meetings: Not on file  . Marital Status: Not on file  Intimate Partner Violence:   . Fear of Current or Ex-Partner: Not on file  . Emotionally Abused: Not on file  . Physically Abused: Not on file  . Sexually Abused: Not on file    Past Surgical History:  Procedure Laterality Date  . CERVICAL BIOPSY  W/ LOOP ELECTRODE EXCISION    . CESAREAN SECTION     x 1  . CYSTOSCOPY N/A 06/02/2018   Procedure: CYSTOSCOPY;  Surgeon: Salvadore Dom, MD;  Location: [facility];  Service: Gynecology;  Laterality: N/A;  . DILITATION & CURRETTAGE/HYSTROSCOPY WITH NOVASURE ABLATION N/A 04/01/2015   Procedure: DILATATION & CURETTAGE/HYSTEROSCOPY WITH NOVASURE ABLATION;  Surgeon: [name], MD;  Location: [facility];  Service: Gynecology;  Laterality: N/A;  . ENDOMETRIAL ABLATION    . TONSILLECTOMY    . TOTAL LAPAROSCOPIC HYSTERECTOMY WITH SALPINGECTOMY Bilateral 06/02/2018   Procedure: TOTAL LAPAROSCOPIC HYSTERECTOMY WITH SALPINGECTOMY;  Surgeon: Salvadore Dom, MD;  Location: [facility];  Service: Gynecology;  Laterality: Bilateral;  1.5 hours OR time per Dr Talbert Nan. Will need extended recovery bed.  .  TUBAL LIGATION      Family History  Problem Relation Age of Onset  . Breast cancer Mother 65       "3x"  . Lung cancer Father   . Breast cancer Maternal Aunt 61  . Lymphoma Paternal Grandmother 24  . Colon cancer Maternal Grandfather 10  . Melanoma Paternal Grandfather 68  . Melanoma Cousin     Allergies  Allergen Reactions  . Penicillins Anaphylaxis, Shortness Of Breath and Other (See Comments)    Tachycardia  Has patient had a PCN reaction causing immediate rash, facial/tongue/throat swelling, SOB or lightheadedness with hypotension: Yes Has patient had a PCN reaction causing severe rash involving mucus membranes or skin necrosis: No Has patient had a PCN reaction that required hospitalization: No Has patient had a PCN reaction occurring within the last 10 years: Yes If all of the above answers are "NO", then may proceed with Cephalosporin use.   . Bupropion Rash    Blister    Current Medications:   Current Outpatient Medications:  .  acetaminophen (TYLENOL) 325 MG tablet, Take 2 tablets (650 mg total) by mouth every 4 (four) hours as needed for mild pain., Disp: 30 tablet, Rfl: 0 .  amphetamine-dextroamphetamine (ADDERALL) 20 MG tablet, Take 1 tablet (20 mg total) by mouth 2 (two) times daily., Disp: 60 tablet, Rfl: 0 .  chlorthalidone (HYGROTON) 25 MG tablet, Take 1/2 tablet daily, Disp: 30 tablet, Rfl: 0 .  cyanocobalamin (,VITAMIN B-12,) 1000 MCG/ML injection, 1000 mcg (1 mg) injection once per week for four weeks, followed by 1000 mcg injection once per month., Disp: 1 mL, Rfl: 15 .  diazepam (VALIUM) 5 MG tablet, Take 1 tablet (5 mg total) by mouth every 12 (twelve) hours as needed for anxiety., Disp: 30 tablet, Rfl: 1 .  escitalopram (LEXAPRO) 10 MG tablet, TAKE 1 TABLET(10 MG) BY MOUTH DAILY, Disp: 90 tablet, Rfl: 1 .  lisdexamfetamine (VYVANSE) 70 MG capsule, Take 1 capsule (70 mg total) by mouth daily., Disp: 30 capsule, Rfl: 0 .  [START ON 10/04/2019]  lisdexamfetamine (VYVANSE) 70 MG capsule, Take 1 capsule (70 mg total) by mouth daily., Disp: 30 capsule, Rfl: 0 .  rizatriptan (MAXALT) 5 MG tablet, Take 1 tablet (5 mg total) by mouth as needed for migraine. May repeat in 2 hours if needed, Disp: 10 tablet, Rfl: 0 .  SUMAtriptan (IMITREX) 25 MG tablet, Take 1 tablet (25 mg total) by mouth every 2 (two) hours as needed for migraine. May repeat in 2 hours if headache persists or recurs., Disp: 10 tablet, Rfl: 0 .  lisdexamfetamine (VYVANSE) 70 MG capsule, Take 1 capsule (70 mg total) by mouth daily before breakfast., Disp: 30  capsule, Rfl: 0 .  propranolol (INDERAL) 20 MG tablet, Take 1 tablet (20 mg total) by mouth 2 (two) times daily as needed (palpitations)., Disp: 30 tablet, Rfl: 0   Review of Systems:   ROS  Negative unless otherwise specified per HPI.  Vitals:   Vitals:   09/29/19 0747  BP: 122/90  Pulse: 88  SpO2: 98%  Weight: 168 lb (76.2 kg)     Body mass index is 29.76 kg/m.  Physical Exam:   Physical Exam Vitals and nursing note reviewed.  Constitutional:      General: She is not in acute distress.    Appearance: She is well-developed. She is not ill-appearing or toxic-appearing.  Cardiovascular:     Rate and Rhythm: Normal rate and regular rhythm.     Pulses: Normal pulses.     Heart sounds: Normal heart sounds, S1 normal and S2 normal.     Comments: No LE edema Pulmonary:     Effort: Pulmonary effort is normal.     Breath sounds: Normal breath sounds.  Skin:    General: Skin is warm and dry.  Neurological:     Mental Status: She is alert.     GCS: GCS eye subscore is 4. GCS verbal subscore is 5. GCS motor subscore is 6.  Psychiatric:        Speech: Speech normal.        Behavior: Behavior normal. Behavior is cooperative.      Assessment and Plan:   Areonna was seen today for tachycardia.  Diagnoses and all orders for this visit:  Tachycardia -     EKG 12-Lead -     Holter monitor - 48 hour;  Future  Other orders -     propranolol (INDERAL) 20 MG tablet; Take 1 tablet (20 mg total) by mouth 2 (two) times daily as needed (palpitations).   EKG tracing is personally reviewed.  EKG notes NSR.  No acute changes.  No red flags on exam or discussion. Will order Holter monitor for further evaluation and management. Propranolol prn for symptomatic palpitations. Continue to work on lifestyle changes -- stress/tobacco/caffeine reduction.  . Reviewed expectations re: course of current medical issues. . Discussed self-management of symptoms. . Outlined signs and symptoms indicating need for more acute intervention. . Patient verbalized understanding and all questions were answered. . See orders for this visit as documented in the electronic medical record. . Patient received an After-Visit Summary.  CMA or LPN served as scribe during this visit. History, Physical, and Plan performed by medical provider. The above documentation has been reviewed and is accurate and complete.  Inda Coke, PA-C

## 2019-09-30 ENCOUNTER — Ambulatory Visit (INDEPENDENT_AMBULATORY_CARE_PROVIDER_SITE_OTHER): Admitting: Neurology

## 2019-09-30 ENCOUNTER — Encounter: Payer: Self-pay | Admitting: Neurology

## 2019-09-30 ENCOUNTER — Telehealth: Payer: Self-pay | Admitting: *Deleted

## 2019-09-30 VITALS — BP 130/89 | HR 102 | Temp 97.7°F | Ht 62.25 in | Wt 170.0 lb

## 2019-09-30 DIAGNOSIS — G4719 Other hypersomnia: Secondary | ICD-10-CM

## 2019-09-30 DIAGNOSIS — R030 Elevated blood-pressure reading, without diagnosis of hypertension: Secondary | ICD-10-CM

## 2019-09-30 DIAGNOSIS — G2581 Restless legs syndrome: Secondary | ICD-10-CM

## 2019-09-30 DIAGNOSIS — R0683 Snoring: Secondary | ICD-10-CM | POA: Diagnosis not present

## 2019-09-30 DIAGNOSIS — E669 Obesity, unspecified: Secondary | ICD-10-CM

## 2019-09-30 DIAGNOSIS — R519 Headache, unspecified: Secondary | ICD-10-CM | POA: Diagnosis not present

## 2019-09-30 DIAGNOSIS — R635 Abnormal weight gain: Secondary | ICD-10-CM

## 2019-09-30 NOTE — Patient Instructions (Signed)

## 2019-09-30 NOTE — Telephone Encounter (Signed)
Patient enrolled for [employer] to mail a 3 day ZIO XT long term holter monitor to her home.

## 2019-09-30 NOTE — Progress Notes (Signed)
Subjective:    Patient ID: Haley Mcdonald is a 39 y.o. female.  HPI     Star Age, MD, PhD [facility] [address], [address] [place], [place] [address]  Dear Aldona Bar,   I saw your patient, Haley Mcdonald, upon your kind request, in my sleep clinic today for initial consultation of her sleep disorder, in particular, concern for underlying obstructive sleep apnea.  The patient is unaccompanied today.  As you know, Haley Mcdonald is a 40 year old right-handed woman with an underlying medical history of migraines, prediabetes, smoking, B12 deficiency, cervical dysplasia, asthma, anxiety, ADD, elevated blood pressure values and borderline obesity, who reports elevated blood pressure numbers in the recent past.  I reviewed your office note from 09/23/2019.  She was started on chlorthalidone at the time. She reports snoring and excessive daytime somnolence.  Her Epworth sleepiness score is 13 out of 24 today.  She smokes about 1/4 pack/day, she drinks alcohol rarely, maybe 1 beer every 6 months, caffeine in the form of soda, up to 4 bottles a day, 20 ounce each.  She has recently come back, is also trying to quit smoking.  She is trying to lose weight.  She has been about 20 pounds in the past year or so.  Her husband snores, she feels that her sleep environment can be disruptive, she does not have any TV in the bedroom, they have 1 dog and 1 cat in the household, she lives with her husband, her 52 year old, 12 year old and 70-year-old children as well as 31 year old family friend.  She is not aware of any family history of OSA.  She has had morning headaches, she denies night to night nocturia, has had intermittent restless leg symptoms from time to time but is not sure if she twitches or kicks in her sleep.  She has a bedtime generally between 930 and 10 and rise time between 5 and 6, she works as a Technical brewer.  She had a tonsillectomy as a child.  She has noticed an  increase in her migraines, sleep deprivation seems to be a trigger.  She has been prescribed Inderal but has not started it yet.  She takes Maxalt as needed.  Her Past Medical History Is Significant For: Past Medical History:  Diagnosis Date  . ADD (attention deficit disorder) without hyperactivity   . Anxiety   . Asthma    as a child  . Cervical dysplasia   . Family history of breast cancer   . Family history of lymphoma   . Family history of melanoma   . Headache   . Migraine with aura   . PONV (postoperative nausea and vomiting)   . Pre-diabetes    gestational diabetes also  . Pure hypercholesterolemia 12/20/2018    Her Past Surgical History Is Significant For: Past Surgical History:  Procedure Laterality Date  . CERVICAL BIOPSY  W/ LOOP ELECTRODE EXCISION    . CESAREAN SECTION     x 1  . CYSTOSCOPY N/A 06/02/2018   Procedure: CYSTOSCOPY;  Surgeon: Salvadore Dom, MD;  Location: [facility];  Service: Gynecology;  Laterality: N/A;  . DILITATION & CURRETTAGE/HYSTROSCOPY WITH NOVASURE ABLATION N/A 04/01/2015   Procedure: DILATATION & CURETTAGE/HYSTEROSCOPY WITH NOVASURE ABLATION;  Surgeon: [name], MD;  Location: [facility];  Service: Gynecology;  Laterality: N/A;  . ENDOMETRIAL ABLATION    . TONSILLECTOMY    . TOTAL LAPAROSCOPIC HYSTERECTOMY WITH SALPINGECTOMY Bilateral 06/02/2018   Procedure: TOTAL LAPAROSCOPIC HYSTERECTOMY  WITH SALPINGECTOMY;  Surgeon: Salvadore Dom, MD;  Location: [facility];  Service: Gynecology;  Laterality: Bilateral;  1.5 hours OR time per Dr Talbert Nan. Will need extended recovery bed.  . TUBAL LIGATION      Her Family History Is Significant For: Family History  Problem Relation Age of Onset  . Breast cancer Mother 68       "3x"  . Lung cancer Father   . Breast cancer Maternal Aunt 63  . Lymphoma Paternal Grandmother 85  . Colon cancer Maternal Grandfather 87  . Melanoma Paternal Grandfather 53  .  Melanoma Cousin     Her Social History Is Significant For: Social History   Socioeconomic History  . Marital status: Married    Spouse name: Not on file  . Number of children: Not on file  . Years of education: Not on file  . Highest education level: Not on file  Occupational History  . Not on file  Tobacco Use  . Smoking status: Current Every Day Smoker    Packs/day: 0.25    Years: 10.00    Pack years: 2.50    Types: Cigarettes  . Smokeless tobacco: Never Used  Substance and Sexual Activity  . Alcohol use: Not Currently  . Drug use: No  . Sexual activity: Yes    Birth control/protection: Surgical    Comment: Hysterectomy  Other Topics Concern  . Not on file  Social History Narrative  . Not on file   Social Determinants of Health   Financial Resource Strain:   . Difficulty of Paying Living Expenses: Not on file  Food Insecurity:   . Worried About Charity fundraiser in the Last Year: Not on file  . Ran Out of Food in the Last Year: Not on file  Transportation Needs:   . Lack of Transportation (Medical): Not on file  . Lack of Transportation (Non-Medical): Not on file  Physical Activity:   . Days of Exercise per Week: Not on file  . Minutes of Exercise per Session: Not on file  Stress:   . Feeling of Stress : Not on file  Social Connections:   . Frequency of Communication with Friends and Family: Not on file  . Frequency of Social Gatherings with Friends and Family: Not on file  . Attends Religious Services: Not on file  . Active Member of Clubs or Organizations: Not on file  . Attends Archivist Meetings: Not on file  . Marital Status: Not on file    Her Allergies Are:  Allergies  Allergen Reactions  . Penicillins Anaphylaxis, Shortness Of Breath and Other (See Comments)    Tachycardia  Has patient had a PCN reaction causing immediate rash, facial/tongue/throat swelling, SOB or lightheadedness with hypotension: Yes Has patient had a PCN reaction  causing severe rash involving mucus membranes or skin necrosis: No Has patient had a PCN reaction that required hospitalization: No Has patient had a PCN reaction occurring within the last 10 years: Yes If all of the above answers are "NO", then may proceed with Cephalosporin use.   . Bupropion Rash    Blister  :   Her Current Medications Are:  Outpatient Encounter Medications as of 09/30/2019  Medication Sig  . acetaminophen (TYLENOL) 325 MG tablet Take 2 tablets (650 mg total) by mouth every 4 (four) hours as needed for mild pain.  Mar[place] Kitchen amphetamine-dextroamphetamine (ADDERALL) 20 MG tablet Take 1 tablet (20 mg total) by mouth 2 (two) times daily.  [place] Kitchen  chlorthalidone (HYGROTON) 25 MG tablet Take 1/2 tablet daily  . cyanocobalamin (,VITAMIN B-12,) 1000 MCG/ML injection 1000 mcg (1 mg) injection once per week for four weeks, followed by 1000 mcg injection once per month.  . diazepam (VALIUM) 5 MG tablet Take 1 tablet (5 mg total) by mouth every 12 (twelve) hours as needed for anxiety.  [place] Kitchen escitalopram (LEXAPRO) 10 MG tablet TAKE 1 TABLET(10 MG) BY MOUTH DAILY  . [START ON 10/04/2019] lisdexamfetamine (VYVANSE) 70 MG capsule Take 1 capsule (70 mg total) by mouth daily.  . propranolol (INDERAL) 20 MG tablet Take 1 tablet (20 mg total) by mouth 2 (two) times daily as needed (palpitations).  . rizatriptan (MAXALT) 5 MG tablet Take 1 tablet (5 mg total) by mouth as needed for migraine. May repeat in 2 hours if needed  . SUMAtriptan (IMITREX) 25 MG tablet Take 1 tablet (25 mg total) by mouth every 2 (two) hours as needed for migraine. May repeat in 2 hours if headache persists or recurs. (Patient not taking: Reported on 09/30/2019)  . [DISCONTINUED] lisdexamfetamine (VYVANSE) 70 MG capsule Take 1 capsule (70 mg total) by mouth daily before breakfast.  . [DISCONTINUED] lisdexamfetamine (VYVANSE) 70 MG capsule Take 1 capsule (70 mg total) by mouth daily.   No facility-administered encounter medications on  file as of 09/30/2019.  :  Review of Systems:  Out of a complete 14 point review of systems, all are reviewed and negative with the exception of these symptoms as listed below: Review of Systems  Neurological:       Pt presents today, states that she has had concerns with BP elevating as well as increase in headaches and migraines. She has been told she snores as well and they are wanting to rule out OSA. Never had a SS.  Epworth Sleepiness Scale 0= would never doze 1= slight chance of dozing 2= moderate chance of dozing 3= high chance of dozing  Sitting and reading:3 Watching TV:3 Sitting inactive in a public place (ex. Theater or meeting):1 As a passenger in a car for an hour without a break:2 Lying down to rest in the afternoon:3 Sitting and talking to someone:0 Sitting quietly after lunch (no alcohol):1 In a car, while stopped in traffic:0 Total:13     Objective:  Neurological Exam  Physical Exam Physical Examination:   Vitals:   09/30/19 1546  BP: 130/89  Pulse: (!) 102  Temp: 97.7 F (36.5 C)    General Examination: The patient is a very pleasant 39 y.o. female in no acute distress. She appears well-developed and well-nourished and well groomed.   HEENT: Normocephalic, atraumatic, pupils are equal, round and reactive to light, extraocular tracking is good without limitation to gaze excursion or nystagmus noted. Corrective eyeglasses in place. Hearing is grossly intact. Face is symmetric with normal facial animation. Speech is clear with no dysarthria noted. There is no hypophonia. There is no lip, neck/head, jaw or voice tremor. Neck is supple with full range of passive and active motion. There are no carotid bruits on auscultation. Oropharynx exam reveals: mild mouth dryness, adequate dental hygiene and mild airway crowding, due to Smaller airway entry, Mallampati is class II, tonsils absent, tongue protrudes centrally in palate elevates symmetrically, neck  circumference is 15 and three-quarter inches.  She has a moderate overbite.   Chest: Clear to auscultation without wheezing, rhonchi or crackles noted.  Heart: S1+S2+0, regular and normal without murmurs, rubs or gallops noted.   Abdomen: Soft, non-tender and non-distended with normal  bowel sounds appreciated on auscultation.  Extremities: There is no pitting edema in the distal lower extremities bilaterally.   Skin: Warm and dry without trophic changes noted.   Musculoskeletal: exam reveals no obvious joint deformities, tenderness or joint swelling or erythema.   Neurologically:  Mental status: The patient is awake, alert and oriented in all 4 spheres. Her immediate and remote memory, attention, language skills and fund of knowledge are appropriate. There is no evidence of aphasia, agnosia, apraxia or anomia. Speech is clear with normal prosody and enunciation. Thought process is linear. Mood is normal and affect is normal.  Cranial nerves II - XII are as described above under HEENT exam.  Motor exam: Normal bulk, strength and tone is noted. There is no tremor, fine motor skills and coordination: grossly intact.  Cerebellar testing: No dysmetria or intention tremor. There is no truncal or gait ataxia.  Sensory exam: intact to light touch in the upper and lower extremities.                 Assessment and Plan:  In summary, Haley Mcdonald is a very pleasant 39 y.o.-year old female with an underlying medical history of migraines, prediabetes, smoking, B12 deficiency, cervical dysplasia, asthma, anxiety, ADD, elevated blood pressure values and borderline obesity, whose history and physical exam are concerning for obstructive sleep apnea (OSA). I had a long chat with the patient about my findings and the diagnosis of OSA, its prognosis and treatment options. We talked about medical treatments, surgical interventions and non-pharmacological approaches. I explained in particular the risks  and ramifications of untreated moderate to severe OSA, especially with respect to developing cardiovascular disease down the Road, including congestive heart failure, difficult to treat hypertension, cardiac arrhythmias, or stroke. Even type 2 diabetes has, in part, been linked to untreated OSA. Symptoms of untreated OSA include daytime sleepiness, memory problems, mood irritability and mood disorder such as depression and anxiety, lack of energy, as well as recurrent headaches, especially morning headaches. We talked about smoking cessation and trying to maintain a healthy lifestyle in general, as well as the importance of weight control. We also talked about the importance of good sleep hygiene. I recommended the following at this time: sleep study.  I explained the sleep test procedure to the patient and also outlined possible surgical and non-surgical treatment options of OSA, including the use of a custom-made dental device (which would require a referral to a specialist dentist or oral surgeon), upper airway surgical options, such as traditional UPPP or a novel less invasive surgical option in the form of Inspire hypoglossal nerve stimulation (which would involve a referral to an ENT surgeon). I also explained the CPAP treatment option to the patient, who indicated that she would be willing to try CPAP if the need arises. I explained the importance of being compliant with PAP treatment, not only for insurance purposes but primarily to improve Her symptoms, and for the patient's long term health benefit, including to reduce Her cardiovascular risks. I answered all her questions today and the patient was in agreement. I plan to see her back after the sleep study is completed and encouraged her to call with any interim questions, concerns, problems or updates.   Thank you very much for allowing me to participate in the care of this nice patient. If I can be of any further assistance to you please do not  hesitate to call me at [phone].  Sincerely,   Star Age, MD, PhD

## 2019-10-12 ENCOUNTER — Ambulatory Visit (INDEPENDENT_AMBULATORY_CARE_PROVIDER_SITE_OTHER)

## 2019-10-12 DIAGNOSIS — R000 Tachycardia, unspecified: Secondary | ICD-10-CM | POA: Diagnosis not present

## 2019-10-14 IMAGING — DX DG ABDOMEN 2V
2 series · 2 of 2 positions shown · non-contrast
Comparison: None.

CLINICAL DATA: Abdominal pain and nausea

EXAM:
ABDOMEN - 2 VIEW

[kub ap (1 of 2)]
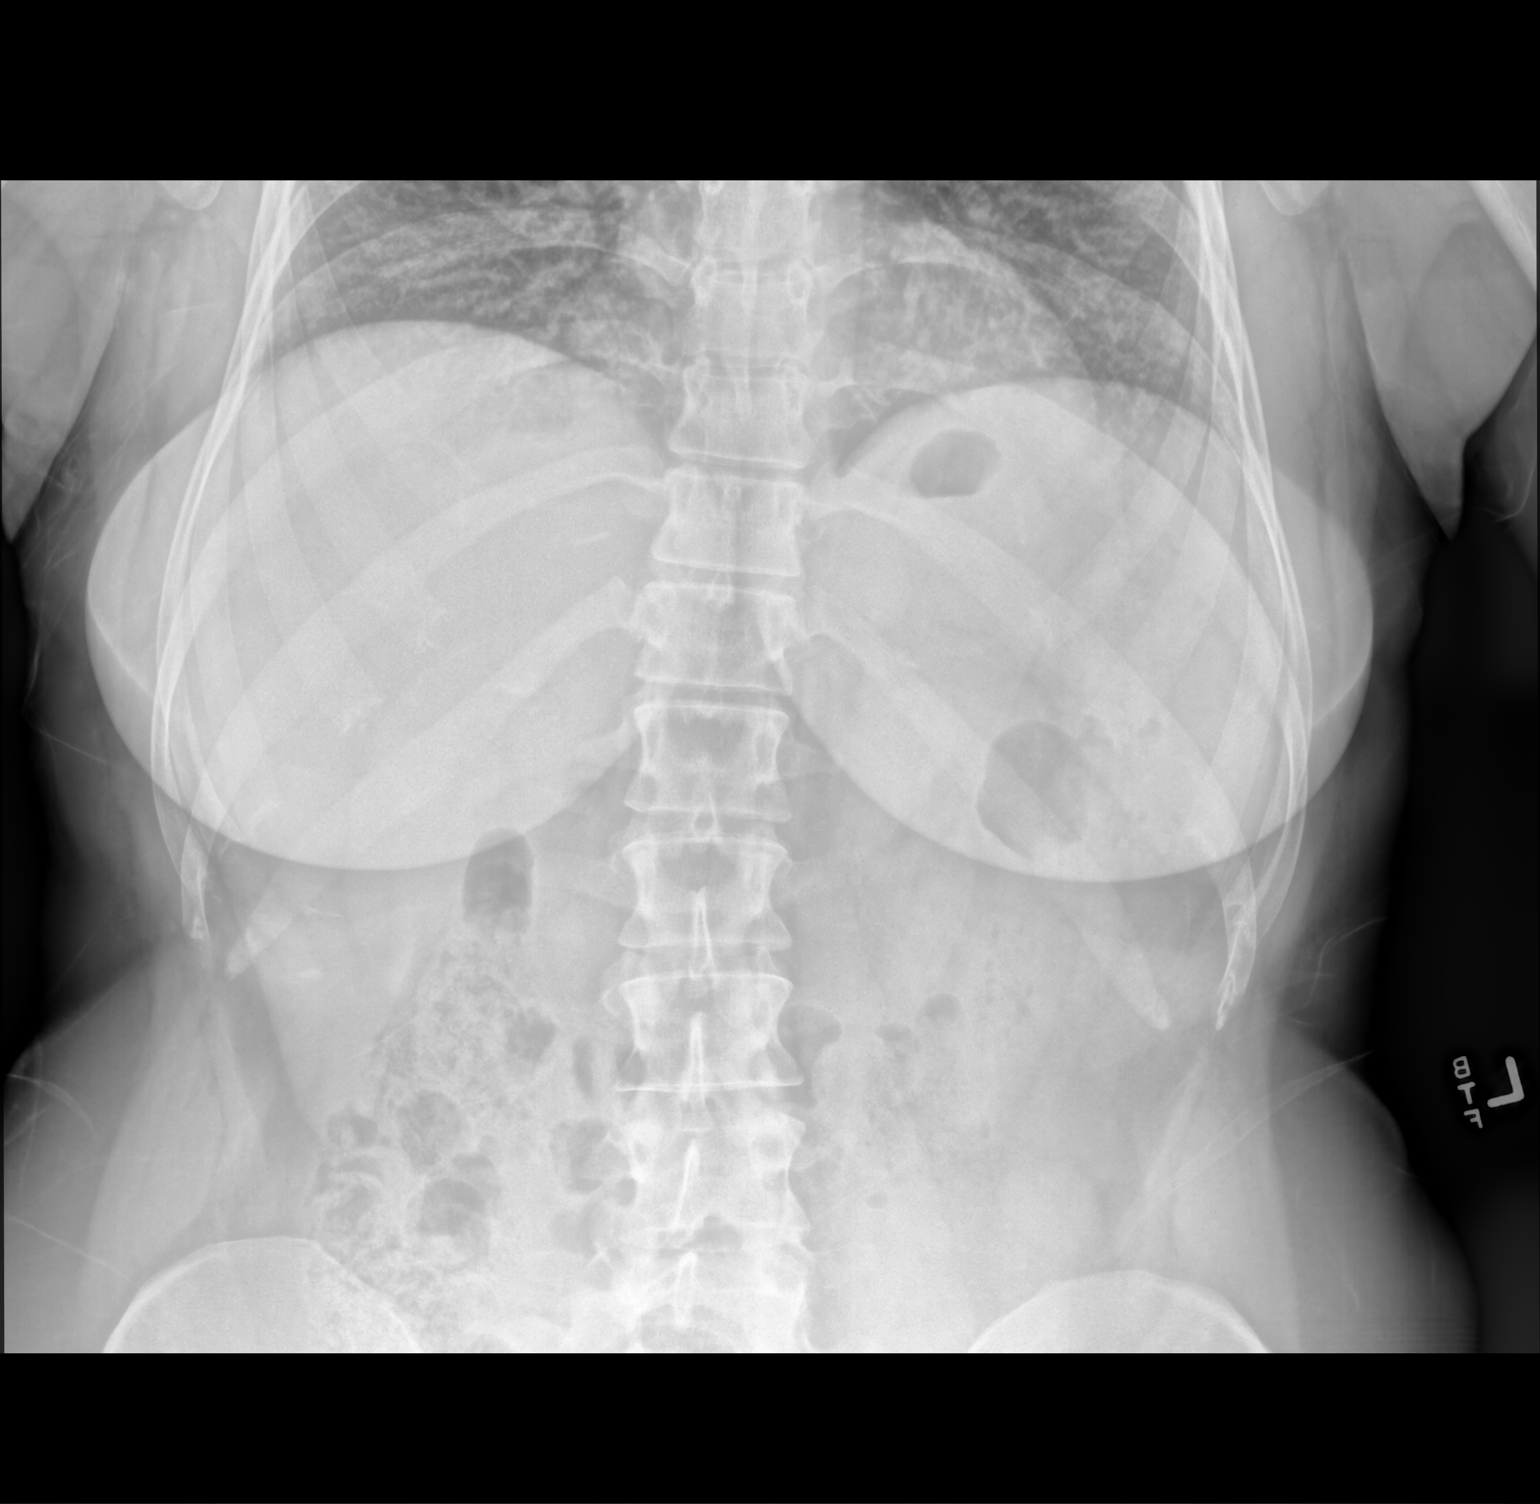

[kub ap (2 of 2)]
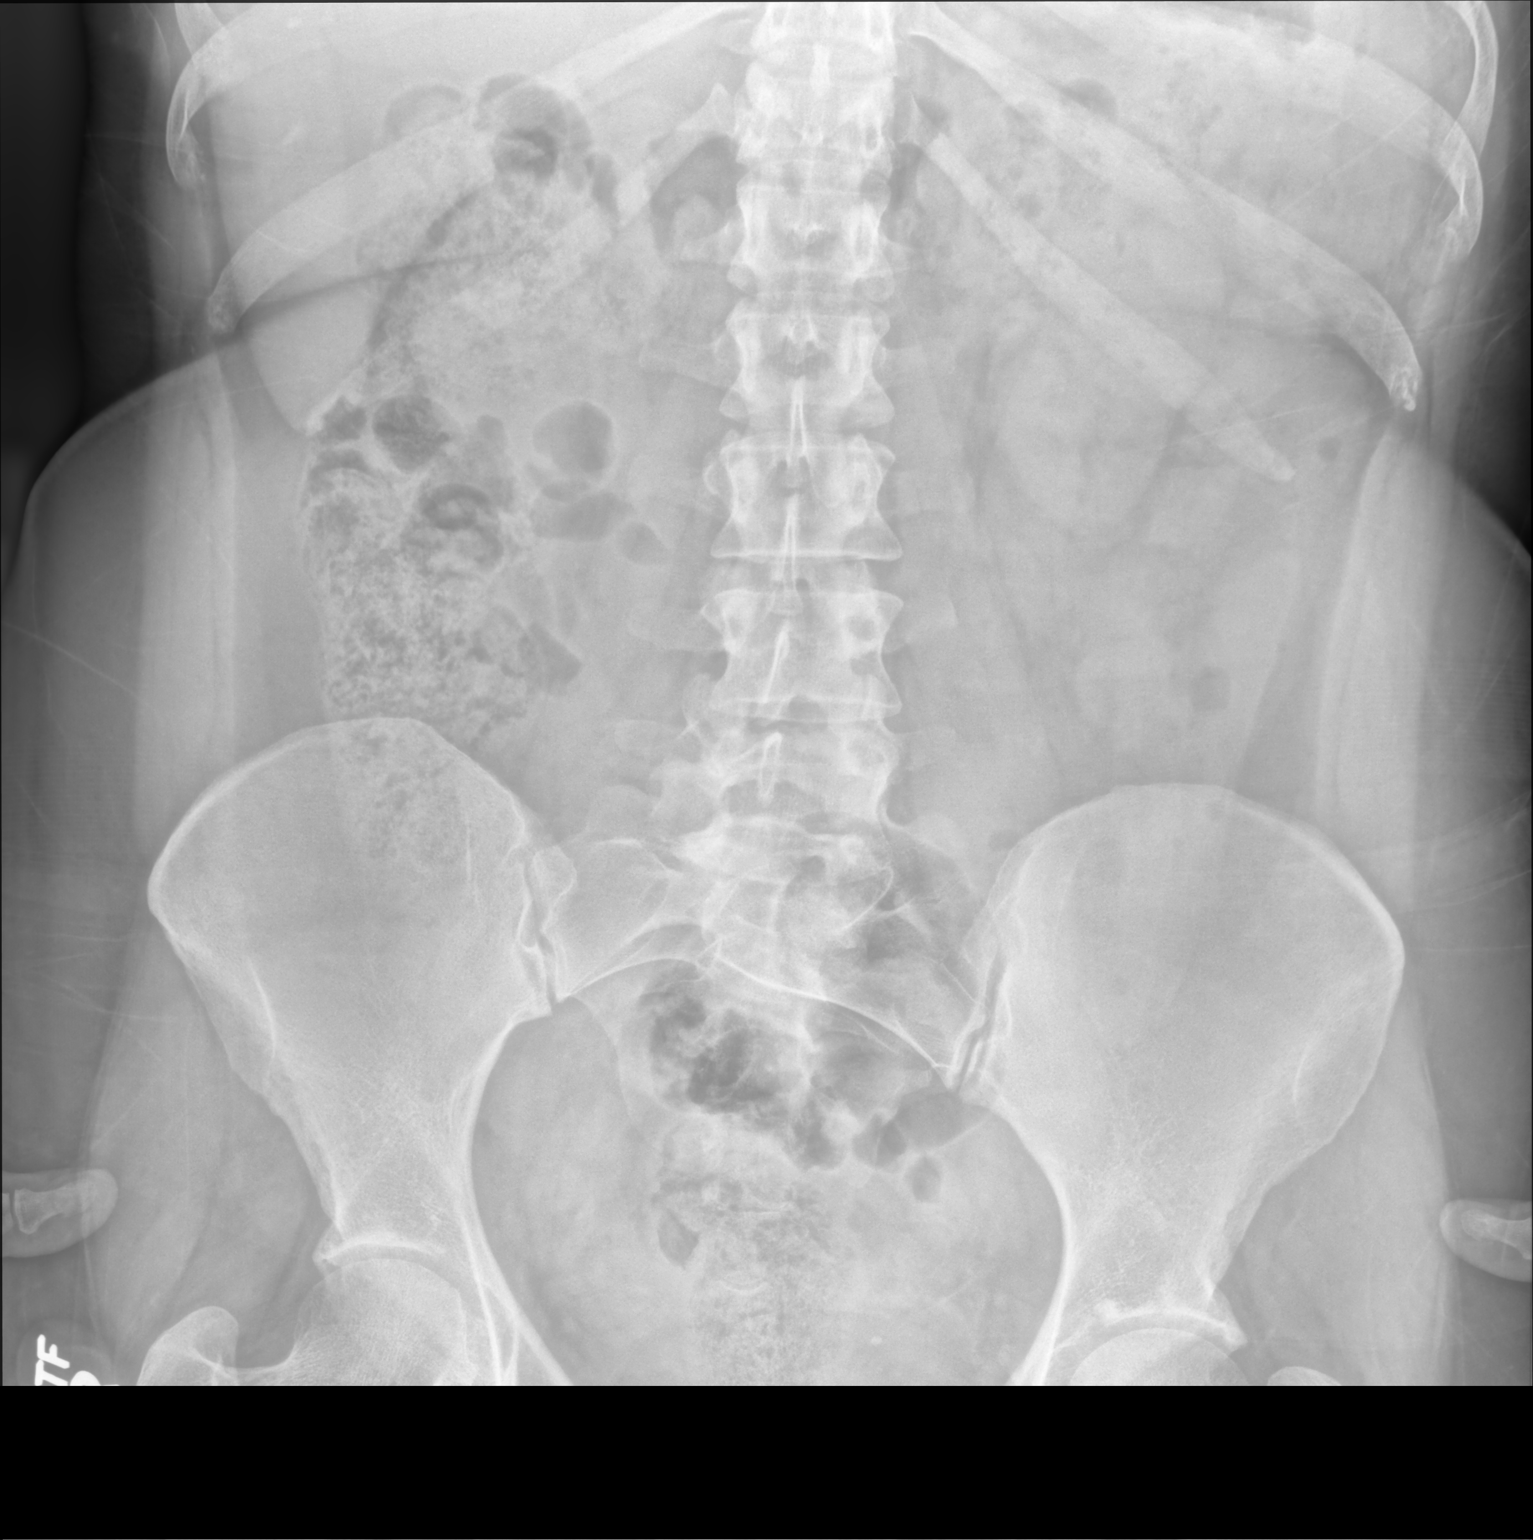

[2 of 2 positions shown; findings below may reference images not displayed]

FINDINGS: Supine and upright images were obtained. There is stool throughout
much of the colon. There is no bowel dilatation or air-fluid level
to suggest bowel obstruction. No free air. Small pelvic
calcifications likely represent phleboliths. Lung bases are clear.
IMPRESSION: No bowel obstruction or free air.  Lung bases clear.

## 2019-10-16 ENCOUNTER — Other Ambulatory Visit: Payer: Self-pay | Admitting: Physician Assistant

## 2019-10-21 ENCOUNTER — Encounter

## 2019-11-01 ENCOUNTER — Encounter

## 2019-11-04 ENCOUNTER — Other Ambulatory Visit: Payer: Self-pay

## 2019-11-04 ENCOUNTER — Ambulatory Visit (INDEPENDENT_AMBULATORY_CARE_PROVIDER_SITE_OTHER): Admitting: Neurology

## 2019-11-04 DIAGNOSIS — R519 Headache, unspecified: Secondary | ICD-10-CM

## 2019-11-04 DIAGNOSIS — R030 Elevated blood-pressure reading, without diagnosis of hypertension: Secondary | ICD-10-CM

## 2019-11-04 DIAGNOSIS — R0683 Snoring: Secondary | ICD-10-CM | POA: Diagnosis not present

## 2019-11-04 DIAGNOSIS — G4719 Other hypersomnia: Secondary | ICD-10-CM

## 2019-11-04 DIAGNOSIS — G2581 Restless legs syndrome: Secondary | ICD-10-CM

## 2019-11-04 DIAGNOSIS — G4720 Circadian rhythm sleep disorder, unspecified type: Secondary | ICD-10-CM

## 2019-11-04 DIAGNOSIS — R635 Abnormal weight gain: Secondary | ICD-10-CM

## 2019-11-04 DIAGNOSIS — E669 Obesity, unspecified: Secondary | ICD-10-CM

## 2019-11-10 ENCOUNTER — Encounter: Payer: Self-pay | Admitting: Neurology

## 2019-11-12 ENCOUNTER — Encounter: Payer: Self-pay | Admitting: Neurology

## 2019-11-12 ENCOUNTER — Telehealth: Payer: Self-pay | Admitting: Neurology

## 2019-11-12 NOTE — Procedures (Signed)
PATIENT'S NAME:  Haley Mcdonald, Haley Mcdonald DOB:      September 22, 1980      MR#:    [id number]     DATE OF RECORDING: 11/04/2019 REFERRING M.D.:  Inda Coke, [place] Study Performed:   Baseline Polysomnogram HISTORY: 39 year old woman with a history of migraines, prediabetes, smoking, B12 deficiency, cervical dysplasia, asthma, anxiety, ADD, elevated blood pressure values and borderline obesity who reports snoring and excessive daytime somnolence. The patient endorsed the Epworth Sleepiness Scale at 13 points. The patient's weight 170 pounds with a height of 62 (inches), resulting in a BMI of 31.2 kg/m2. The patient's neck circumference measured 15.8 inches.  CURRENT MEDICATIONS: Tylenol, Adderall, Hygroton, Vit B12 injection, Valium, Lexapro, Vyvanse, Inderal, Maxalt, Imitrex   PROCEDURE:  This is a multichannel digital polysomnogram utilizing the Somnostar 11.2 system.  Electrodes and sensors were applied and monitored per AASM Specifications.   EEG, EOG, Chin and Limb EMG, were sampled at 200 Hz.  ECG, Snore and Nasal Pressure, Thermal Airflow, Respiratory Effort, CPAP Flow and Pressure, Oximetry was sampled at 50 Hz. Digital video and audio were recorded.      BASELINE STUDY  Lights Out was at 20:55 and Lights On at 05:02.  Total recording time (TRT) was 487.5 minutes, with a total sleep time (TST) of 342.5 minutes.   The patient's sleep latency was 26 minutes.  REM latency was 272.5 minutes, which is markedly delayed. The sleep efficiency was 70.3%.     SLEEP ARCHITECTURE: WASO (Wake after sleep onset) was 119 minutes with moderate sleep fragmentation noted. There were 32.5 minutes in Stage N1, 161.5 minutes Stage N2, 100 minutes Stage N3 and 48.5 minutes in Stage REM.  The percentage of Stage N1 was 9.5%, which is increased, Stage N2 was 47.2%, Stage N3 was 29.2%, which is increased, and Stage R (REM sleep) was 14.2%, which is reduced. The arousals were noted as: 39 were spontaneous, 5 were associated with  PLMs, 0 were associated with respiratory events.  RESPIRATORY ANALYSIS:  There were a total of 2 respiratory events:  0 obstructive apneas, 0 central apneas and 0 mixed apneas with a total of 0 apneas and an apnea index (AI) of 0 /hour. There were 2 hypopneas with a hypopnea index of .4 /hour. The patient also had 0 respiratory event related arousals (RERAs).      The total APNEA/HYPOPNEA INDEX (AHI) was .4/hour and the total RESPIRATORY DISTURBANCE INDEX was  .4 /hour.  2 events occurred in REM sleep and 0 events in NREM. The REM AHI was  2.5 /hour, versus a non-REM AHI of 0. The patient spent 23.5 minutes of total sleep time in the supine position and 319 minutes in non-supine.. The supine AHI was 0.0 versus a non-supine AHI of 0.4.  OXYGEN SATURATION & C02:  The Wake baseline 02 saturation was 96%, with the lowest being 86%, briefly. Time spent below 89% saturation equaled 1 minutes.  PERIODIC LIMB MOVEMENTS: The patient had a total of 17 Periodic Limb Movements.  The Periodic Limb Movement (PLM) index was 3. and the PLM Arousal index was .9/hour.  Audio and video analysis did not show any abnormal or unusual movements, behaviors, phonations or vocalizations. The patient took no bathroom breaks. Mild intermittent snoring was noted. The EKG was in keeping with normal sinus rhythm (NSR).  Post-study, the patient indicated that sleep was the same as usual.   IMPRESSION:  1. Primary Snoring 2. Dysfunctions associated with sleep stages or arousal from sleep  RECOMMENDATIONS:  1. This study does not demonstrate any significant obstructive or central sleep disordered breathing with the exception of mild, intermittent snoring. This study does not support an intrinsic sleep disorder as a cause of the patient's symptoms. Other causes, including circadian rhythm disturbances, an underlying mood disorder, medication effect and/or an underlying medical problem cannot be ruled out. 2. This study shows  sleep fragmentation and abnormal sleep stage percentages; these are nonspecific findings and per se do not signify an intrinsic sleep disorder or a cause for the patient's sleep-related symptoms. Causes include (but are not limited to) the first night effect of the sleep study, circadian rhythm disturbances, medication effect or an underlying mood disorder or medical problem.  3. The patient should be cautioned not to drive, work at heights, or operate dangerous or heavy equipment when tired or sleepy. Review and reiteration of good sleep hygiene measures should be pursued with any patient. 4. The patient will be advised to follow up with the referring provider, who will be notified of the test results.  I certify that I have reviewed the entire raw data recording prior to the issuance of this report in accordance with the Standards of Accreditation of the American Academy of Sleep Medicine (AASM)   Star Age, MD, PhD Diplomat, American Board of Neurology and Sleep Medicine (Neurology and Sleep Medicine)

## 2019-11-12 NOTE — Telephone Encounter (Signed)
Called patient to discuss sleep study results. No answer at this time. LVM for the patient to call back.  I will send the patient a mychart message as well.

## 2019-11-12 NOTE — Telephone Encounter (Signed)
-----   Message from Star Age, MD sent at 11/12/2019  8:04 AM EDT ----- Patient referred by Inda Coke, PA, seen by me on 09/30/19, diagnostic PSG on 11/04/19.   Please call and notify the patient that the recent sleep study did not show any significant obstructive sleep apnea. She had mild, intermittent snoring; CPAP therapy is not indicated, but weight loss may help reduce her snoring. She did not sleep very well, but achieved all stages of sleep. No significant leg movements noted, no EKG changes noted.  Please inform patient that she can FU with her PCP.  Please remind patient to try to maintain good sleep hygiene, which means: Keep a regular sleep and wake schedule and make enough time for sleep (7 1/2 to 8 1/2 hours for the average adult), try not to exercise or have a meal within 2 hours of your bedtime, try to keep your bedroom conducive for sleep, that is, cool and dark, without light distractors such as an illuminated alarm clock, and refrain from watching TV right before sleep or in the middle of the night and do not keep the TV or radio on during the night. If a nightlight is used, have it away from the visual field. Also, try not to use or play on electronic devices at bedtime, such as your cell phone, tablet PC or laptop. If you like to read at bedtime on an electronic device, try to dim the background light as much as possible. Do not eat in the middle of the night. Keep pets away from the bedroom environment. For stress relief, try meditation, deep breathing exercises (there are many books and CDs available), a white noise machine or fan can help to diffuse other noise distractors, such as traffic noise. Do not drink alcohol before bedtime, as it can disturb sleep and cause middle of the night awakenings. Never mix alcohol and sedating medications! Avoid narcotic pain medication close to bedtime, as opioids/narcotics can suppress breathing drive and breathing effort.    Thanks,  Star Age, MD, PhD [facility])

## 2019-11-12 NOTE — Progress Notes (Signed)
Patient referred by Haley Coke, PA, seen by me on 09/30/19, diagnostic PSG on 11/04/19.   Please call and notify the patient that the recent sleep study did not show any significant obstructive sleep apnea. She had mild, intermittent snoring; CPAP therapy is not indicated, but weight loss may help reduce her snoring. She did not sleep very well, but achieved all stages of sleep. No significant leg movements noted, no EKG changes noted.  Please inform patient that she can FU with her PCP.  Please remind patient to try to maintain good sleep hygiene, which means: Keep a regular sleep and wake schedule and make enough time for sleep (7 1/2 to 8 1/2 hours for the average adult), try not to exercise or have a meal within 2 hours of your bedtime, try to keep your bedroom conducive for sleep, that is, cool and dark, without light distractors such as an illuminated alarm clock, and refrain from watching TV right before sleep or in the middle of the night and do not keep the TV or radio on during the night. If a nightlight is used, have it away from the visual field. Also, try not to use or play on electronic devices at bedtime, such as your cell phone, tablet PC or laptop. If you like to read at bedtime on an electronic device, try to dim the background light as much as possible. Do not eat in the middle of the night. Keep pets away from the bedroom environment. For stress relief, try meditation, deep breathing exercises (there are many books and CDs available), a white noise machine or fan can help to diffuse other noise distractors, such as traffic noise. Do not drink alcohol before bedtime, as it can disturb sleep and cause middle of the night awakenings. Never mix alcohol and sedating medications! Avoid narcotic pain medication close to bedtime, as opioids/narcotics can suppress breathing drive and breathing effort.    Thanks,  Star Age, MD, PhD [facility])

## 2019-11-19 ENCOUNTER — Other Ambulatory Visit: Payer: Self-pay | Admitting: Physician Assistant

## 2019-11-23 ENCOUNTER — Encounter: Payer: Self-pay | Admitting: Physician Assistant

## 2019-11-23 ENCOUNTER — Ambulatory Visit (INDEPENDENT_AMBULATORY_CARE_PROVIDER_SITE_OTHER): Admitting: Physician Assistant

## 2019-11-23 ENCOUNTER — Ambulatory Visit: Admitting: Physician Assistant

## 2019-11-23 ENCOUNTER — Other Ambulatory Visit: Payer: Self-pay

## 2019-11-23 VITALS — BP 130/90 | HR 95 | Temp 98.3°F | Ht 62.25 in | Wt 170.0 lb

## 2019-11-23 DIAGNOSIS — R0981 Nasal congestion: Secondary | ICD-10-CM

## 2019-11-23 MED ORDER — MAGIC MOUTHWASH W/LIDOCAINE: 5.0000 mL | Freq: Three times a day (TID) | ORAL | 0 refills | Status: AC | PRN

## 2019-11-23 MED ORDER — ALBUTEROL SULFATE HFA 108 (90 BASE) MCG/ACT IN AERS: 2.0000 | INHALATION_SPRAY | Freq: Four times a day (QID) | RESPIRATORY_TRACT | 2 refills | Status: DC | PRN

## 2019-11-23 MED ORDER — PREDNISONE 20 MG PO TABS: 40.0000 mg | ORAL_TABLET | Freq: Every day | ORAL | 0 refills | Status: DC

## 2019-11-23 MED ORDER — AZITHROMYCIN 250 MG PO TABS: ORAL_TABLET | ORAL | 0 refills | Status: DC

## 2019-11-23 NOTE — Progress Notes (Signed)
Haley Mcdonald is a 39 y.o. female here for a new problem.  I acted as a Education administrator for [employer], PA-C Anselmo Pickler, LPN  History of Present Illness:   Chief Complaint  Patient presents with  . Sinus Problem    HPI   Sinus problem Pt c/o nasal congestion and cough x 1 week. Pt coughing and expectorating green sputum also having bloody nasal drainage. Fever over the weekend has subsided. She also complains of sore throat.  Negative COVID-19 test.   Past Medical History:  Diagnosis Date  . ADD (attention deficit disorder) without hyperactivity   . Anxiety   . Asthma    as a child  . Cervical dysplasia   . Family history of breast cancer   . Family history of lymphoma   . Family history of melanoma   . Headache   . Migraine with aura   . PONV (postoperative nausea and vomiting)   . Pre-diabetes    gestational diabetes also  . Pure hypercholesterolemia 12/20/2018     Social History   Socioeconomic History  . Marital status: Married    Spouse name: Not on file  . Number of children: Not on file  . Years of education: Not on file  . Highest education level: Not on file  Occupational History  . Not on file  Tobacco Use  . Smoking status: Current Every Day Smoker    Packs/day: 0.25    Years: 10.00    Pack years: 2.50    Types: Cigarettes  . Smokeless tobacco: Never Used  Substance and Sexual Activity  . Alcohol use: Not Currently  . Drug use: No  . Sexual activity: Yes    Birth control/protection: Surgical    Comment: Hysterectomy  Other Topics Concern  . Not on file  Social History Narrative  . Not on file   Social Determinants of Health   Financial Resource Strain:   . Difficulty of Paying Living Expenses:   Food Insecurity:   . Worried About Charity fundraiser in the Last Year:   . Arboriculturist in the Last Year:   Transportation Needs:   . Film/video editor (Medical):   Marland Kitchen Lack of Transportation (Non-Medical):   Physical  Activity:   . Days of Exercise per Week:   . Minutes of Exercise per Session:   Stress:   . Feeling of Stress :   Social Connections:   . Frequency of Communication with Friends and Family:   . Frequency of Social Gatherings with Friends and Family:   . Attends Religious Services:   . Active Member of Clubs or Organizations:   . Attends Archivist Meetings:   Marland Kitchen Marital Status:   Intimate Partner Violence:   . Fear of Current or Ex-Partner:   . Emotionally Abused:   Marland Kitchen Physically Abused:   . Sexually Abused:     Past Surgical History:  Procedure Laterality Date  . CERVICAL BIOPSY  W/ LOOP ELECTRODE EXCISION    . CESAREAN SECTION     x 1  . CYSTOSCOPY N/A 06/02/2018   Procedure: CYSTOSCOPY;  Surgeon: Salvadore Dom, MD;  Location: [facility];  Service: Gynecology;  Laterality: N/A;  . DILITATION & CURRETTAGE/HYSTROSCOPY WITH NOVASURE ABLATION N/A 04/01/2015   Procedure: DILATATION & CURETTAGE/HYSTEROSCOPY WITH NOVASURE ABLATION;  Surgeon: [name], MD;  Location: [facility];  Service: Gynecology;  Laterality: N/A;  . ENDOMETRIAL ABLATION    . TONSILLECTOMY    .  TOTAL LAPAROSCOPIC HYSTERECTOMY WITH SALPINGECTOMY Bilateral 06/02/2018   Procedure: TOTAL LAPAROSCOPIC HYSTERECTOMY WITH SALPINGECTOMY;  Surgeon: Salvadore Dom, MD;  Location[facility];  Service: Gynecology;  Laterality: Bilateral;  1.5 hours OR time per Dr Talbert Nan. Will need extended recovery bed.  . TUBAL LIGATION      Family History  Problem Relation Age of Onset  . Breast cancer Mother 13       "3x"  . Lung cancer Father   . Breast cancer Maternal Aunt 30  . Lymphoma Paternal Grandmother 25  . Colon cancer Maternal Grandfather 47  . Melanoma Paternal Grandfather 36  . Melanoma Cousin     Allergies  Allergen Reactions  . Penicillins Anaphylaxis, Shortness Of Breath and Other (See Comments)    Tachycardia  Has patient had a PCN reaction causing  immediate rash, facial/tongue/throat swelling, SOB or lightheadedness with hypotension: Yes Has patient had a PCN reaction causing severe rash involving mucus membranes or skin necrosis: No Has patient had a PCN reaction that required hospitalization: No Has patient had a PCN reaction occurring within the last 10 years: Yes If all of the above answers are "NO", then may proceed with Cephalosporin use.   . Bupropion Rash    Blister    Current Medications:   Current Outpatient Medications:  .  acetaminophen (TYLENOL) 325 MG tablet, Take 2 tablets (650 mg total) by mouth every 4 (four) hours as needed for mild pain., Disp: 30 tablet, Rfl: 0 .  amphetamine-dextroamphetamine (ADDERALL) 20 MG tablet, Take 1 tablet (20 mg total) by mouth 2 (two) times daily., Disp: 60 tablet, Rfl: 0 .  chlorthalidone (HYGROTON) 25 MG tablet, TAKE 1/2 TABLET BY MOUTH DAILY, Disp: 30 tablet, Rfl: 2 .  cyanocobalamin (,VITAMIN B-12,) 1000 MCG/ML injection, 1000 mcg (1 mg) injection once per week for four weeks, followed by 1000 mcg injection once per month., Disp: 1 mL, Rfl: 15 .  diazepam (VALIUM) 5 MG tablet, Take 1 tablet (5 mg total) by mouth every 12 (twelve) hours as needed for anxiety., Disp: 30 tablet, Rfl: 1 .  escitalopram (LEXAPRO) 10 MG tablet, TAKE 1 TABLET(10 MG) BY MOUTH DAILY, Disp: 90 tablet, Rfl: 1 .  lisdexamfetamine (VYVANSE) 70 MG capsule, Take 1 capsule (70 mg total) by mouth daily., Disp: 30 capsule, Rfl: 0 .  propranolol (INDERAL) 20 MG tablet, TAKE 1 TABLET BY MOUTH TWICE DAILY AS NEEDED FOR PALPITATIONS, Disp: 60 tablet, Rfl: 2 .  rizatriptan (MAXALT) 5 MG tablet, Take 1 tablet (5 mg total) by mouth as needed for migraine. May repeat in 2 hours if needed, Disp: 10 tablet, Rfl: 0 .  SUMAtriptan (IMITREX) 25 MG tablet, Take 1 tablet (25 mg total) by mouth every 2 (two) hours as needed for migraine. May repeat in 2 hours if headache persists or recurs., Disp: 10 tablet, Rfl: 0 .  albuterol  (VENTOLIN HFA) 108 (90 Base) MCG/ACT inhaler, Inhale 2 puffs into the lungs every 6 (six) hours as needed for wheezing or shortness of breath., Disp: 18 g, Rfl: 2 .  azithromycin (ZITHROMAX) 250 MG tablet, Take two tablets on day 1, then one daily x 4 days, Disp: 6 tablet, Rfl: 0 .  magic mouthwash w/lidocaine SOLN, Take 5 mLs by mouth 3 (three) times daily as needed for up to 10 days for mouth pain., Disp: 60 mL, Rfl: 0 .  predniSONE (DELTASONE) 20 MG tablet, Take 2 tablets (40 mg total) by mouth daily., Disp: 10 tablet, Rfl: 0  Review of Systems:   ROS  Negative unless otherwise specified per HPI.  Vitals:   Vitals:   11/23/19 1201  BP: 130/90  Pulse: 95  Temp: 98.3 F (36.8 C)  TempSrc: Temporal  SpO2: 99%  Weight: 170 lb (77.1 kg)  Height: 5' 2.25" (1.581 m)     Body mass index is 30.84 kg/m.  Physical Exam:   Physical Exam Vitals and nursing note reviewed.  Constitutional:      General: She is not in acute distress.    Appearance: She is well-developed. She is not ill-appearing or toxic-appearing.  HENT:     Head: Normocephalic and atraumatic.     Right Ear: Tympanic membrane, ear canal and external ear normal. Tympanic membrane is not erythematous, retracted or bulging.     Left Ear: Tympanic membrane, ear canal and external ear normal. Tympanic membrane is not erythematous, retracted or bulging.     Nose:     Right Sinus: Maxillary sinus tenderness present. No frontal sinus tenderness.     Left Sinus: Maxillary sinus tenderness present. No frontal sinus tenderness.     Mouth/Throat:     Pharynx: Uvula midline. No posterior oropharyngeal erythema.  Eyes:     General: Lids are normal.     Conjunctiva/sclera: Conjunctivae normal.  Neck:     Trachea: Trachea normal.  Cardiovascular:     Rate and Rhythm: Normal rate and regular rhythm.     Pulses: Normal pulses.     Heart sounds: Normal heart sounds, S1 normal and S2 normal.     Comments: No LE edema Pulmonary:      Effort: Pulmonary effort is normal.     Breath sounds: Normal breath sounds. No decreased breath sounds, wheezing, rhonchi or rales.  Lymphadenopathy:     Cervical: No cervical adenopathy.  Skin:    General: Skin is warm and dry.  Neurological:     Mental Status: She is alert.     GCS: GCS eye subscore is 4. GCS verbal subscore is 5. GCS motor subscore is 6.  Psychiatric:        Speech: Speech normal.        Behavior: Behavior normal. Behavior is cooperative.      Assessment and Plan:   Haley Mcdonald was seen today for sinus problem.  Diagnoses and all orders for this visit:  Sinus congestion No red flags on exam.  Will initiate azithromycin, oral prednisone per orders. Also have sent in oral magic mouthwash solution and albuterol inhaler prn. Discussed taking medications as prescribed. Reviewed return precautions including worsening fever, SOB, worsening cough or other concerns. Push fluids and rest. I recommend that patient follow-up if symptoms worsen or persist despite treatment x 7-10 days, sooner if needed.  Other orders -     azithromycin (ZITHROMAX) 250 MG tablet; Take two tablets on day 1, then one daily x 4 days -     predniSONE (DELTASONE) 20 MG tablet; Take 2 tablets (40 mg total) by mouth daily. -     albuterol (VENTOLIN HFA) 108 (90 Base) MCG/ACT inhaler; Inhale 2 puffs into the lungs every 6 (six) hours as needed for wheezing or shortness of breath. -     magic mouthwash w/lidocaine SOLN; Take 5 mLs by mouth 3 (three) times daily as needed for up to 10 days for mouth pain.  . Reviewed expectations re: course of current medical issues. . Discussed self-management of symptoms. . Outlined signs and symptoms indicating need for more acute intervention. Marland Kitchen  Patient verbalized understanding and all questions were answered. . See orders for this visit as documented in the electronic medical record. . Patient received an After-Visit Summary.  CMA or LPN served as scribe  during this visit. History, Physical, and Plan performed by medical provider. The above documentation has been reviewed and is accurate and complete.  Inda Coke, PA-C

## 2020-01-04 ENCOUNTER — Other Ambulatory Visit: Payer: Self-pay | Admitting: Physician Assistant

## 2020-01-04 DIAGNOSIS — E669 Obesity, unspecified: Secondary | ICD-10-CM

## 2020-01-11 ENCOUNTER — Ambulatory Visit: Admitting: Oncology

## 2020-01-19 ENCOUNTER — Telehealth: Payer: Self-pay | Admitting: *Deleted

## 2020-01-19 ENCOUNTER — Other Ambulatory Visit: Payer: Self-pay | Admitting: Physician Assistant

## 2020-01-19 MED ORDER — LISDEXAMFETAMINE DIMESYLATE 70 MG PO CAPS: 70.0000 mg | ORAL_CAPSULE | Freq: Every day | ORAL | 0 refills | Status: DC

## 2020-01-19 NOTE — Telephone Encounter (Signed)
Pt notified Rx sent 

## 2020-01-19 NOTE — Telephone Encounter (Signed)
Sent!

## 2020-01-19 NOTE — Telephone Encounter (Signed)
Pt requesting refill on Vyvanse 70 mg.

## 2020-01-20 ENCOUNTER — Other Ambulatory Visit: Payer: Self-pay | Admitting: *Deleted

## 2020-01-20 MED ORDER — PREDNISONE 20 MG PO TABS: 20.0000 mg | ORAL_TABLET | Freq: Two times a day (BID) | ORAL | 0 refills | Status: DC

## 2020-01-26 ENCOUNTER — Ambulatory Visit (INDEPENDENT_AMBULATORY_CARE_PROVIDER_SITE_OTHER): Admitting: Family Medicine

## 2020-01-27 ENCOUNTER — Other Ambulatory Visit: Payer: Self-pay

## 2020-01-27 ENCOUNTER — Encounter: Payer: Self-pay | Admitting: Physician Assistant

## 2020-01-27 ENCOUNTER — Ambulatory Visit (INDEPENDENT_AMBULATORY_CARE_PROVIDER_SITE_OTHER): Admitting: Physician Assistant

## 2020-01-27 VITALS — BP 128/78 | HR 78 | Temp 98.2°F | Ht 62.0 in | Wt 173.6 lb

## 2020-01-27 DIAGNOSIS — R5383 Other fatigue: Secondary | ICD-10-CM

## 2020-01-27 DIAGNOSIS — E538 Deficiency of other specified B group vitamins: Secondary | ICD-10-CM | POA: Diagnosis not present

## 2020-01-27 DIAGNOSIS — E559 Vitamin D deficiency, unspecified: Secondary | ICD-10-CM | POA: Diagnosis not present

## 2020-01-27 DIAGNOSIS — E8881 Metabolic syndrome: Secondary | ICD-10-CM | POA: Diagnosis not present

## 2020-01-27 DIAGNOSIS — F419 Anxiety disorder, unspecified: Secondary | ICD-10-CM

## 2020-01-27 DIAGNOSIS — I10 Essential (primary) hypertension: Secondary | ICD-10-CM

## 2020-01-27 DIAGNOSIS — F329 Major depressive disorder, single episode, unspecified: Secondary | ICD-10-CM

## 2020-01-27 DIAGNOSIS — D2262 Melanocytic nevi of left upper limb, including shoulder: Secondary | ICD-10-CM

## 2020-01-27 LAB — HEMOGLOBIN A1C: Hgb A1c MFr Bld: 7.4 % — ABNORMAL HIGH (ref 4.6–6.5)

## 2020-01-27 LAB — CBC WITH DIFFERENTIAL/PLATELET
Basophils Absolute: 0.1 10*3/uL (ref 0.0–0.1)
Basophils Relative: 0.6 % (ref 0.0–3.0)
Eosinophils Absolute: 0.3 10*3/uL (ref 0.0–0.7)
Eosinophils Relative: 3.2 % (ref 0.0–5.0)
HCT: 42.9 % (ref 36.0–46.0)
Hemoglobin: 14.5 g/dL (ref 12.0–15.0)
Lymphocytes Relative: 37.2 % (ref 12.0–46.0)
Lymphs Abs: 3.6 10*3/uL (ref 0.7–4.0)
MCHC: 33.9 g/dL (ref 30.0–36.0)
MCV: 87 fl (ref 78.0–100.0)
Monocytes Absolute: 0.5 10*3/uL (ref 0.1–1.0)
Monocytes Relative: 5.1 % (ref 3.0–12.0)
Neutro Abs: 5.2 10*3/uL (ref 1.4–7.7)
Neutrophils Relative %: 53.9 % (ref 43.0–77.0)
Platelets: 218 10*3/uL (ref 150.0–400.0)
RBC: 4.93 Mil/uL (ref 3.87–5.11)
RDW: 14.2 % (ref 11.5–15.5)
WBC: 9.6 10*3/uL (ref 4.0–10.5)

## 2020-01-27 LAB — TSH: TSH: 2.85 u[IU]/mL (ref 0.35–4.50)

## 2020-01-27 LAB — COMPREHENSIVE METABOLIC PANEL
ALT: 15 U/L (ref 0–35)
AST: 12 U/L (ref 0–37)
Albumin: 4.6 g/dL (ref 3.5–5.2)
Alkaline Phosphatase: 81 U/L (ref 39–117)
BUN: 12 mg/dL (ref 6–23)
CO2: 30 mEq/L (ref 19–32)
Calcium: 9.4 mg/dL (ref 8.4–10.5)
Chloride: 98 mEq/L (ref 96–112)
Creatinine, Ser: 0.65 mg/dL (ref 0.40–1.20)
GFR: 101.59 mL/min (ref >60.00)
Glucose, Bld: 135 mg/dL — ABNORMAL HIGH (ref 70–99)
Potassium: 3 mEq/L — ABNORMAL LOW (ref 3.5–5.1)
Sodium: 137 mEq/L (ref 135–145)
Total Bilirubin: 0.4 mg/dL (ref 0.2–1.2)
Total Protein: 6.6 g/dL (ref 6.0–8.3)

## 2020-01-27 LAB — VITAMIN B12: Vitamin B-12: 366 pg/mL (ref 211–911)

## 2020-01-27 LAB — VITAMIN D 25 HYDROXY (VIT D DEFICIENCY, FRACTURES): VITD: 28.96 ng/mL — ABNORMAL LOW (ref 30.00–100.00)

## 2020-01-27 MED ORDER — LOSARTAN POTASSIUM-HCTZ 50-12.5 MG PO TABS: 1.0000 | ORAL_TABLET | Freq: Every day | ORAL | 1 refills | Status: DC

## 2020-01-27 MED ORDER — FLUOXETINE HCL 20 MG PO CAPS: 20.0000 mg | ORAL_CAPSULE | Freq: Every morning | ORAL | 1 refills | Status: DC

## 2020-01-27 MED ORDER — PROMETHAZINE HCL 25 MG PO TABS: 25.0000 mg | ORAL_TABLET | Freq: Three times a day (TID) | ORAL | 2 refills | Status: DC | PRN

## 2020-01-27 NOTE — Progress Notes (Signed)
Haley Mcdonald is a 39 y.o. female here for a new problem.  History of Present Illness:   Chief Complaint  Patient presents with  . Hypertension    HPI   HTN Currently taking Chlorthalidone 25 mg. At home blood pressure readings are: >140/90. Patient denies chest pain, SOB, blurred vision, dizziness, unusual headaches, lower leg swelling. Patient is compliant with medication. Denies excessive stimulant usage, excessive alcohol intake. Does eat fast food often and drink regular Dr Malachi Bonds daily.  BP Readings from Last 3 Encounters:  01/27/20 128/78  11/23/19 130/90  09/30/19 130/89   Mole Irregular moles throughout body. Would like a dermatology referral for routine skin check.  Anxiety Lexapro causing libido issues. Still feels like she needs medication even though she has held it for awhile. Denies SI/HI or panic attacks. Just needs something to help "take the edge off."  Vit D/B12 deficiency Takes her supplements when she remembers. Would like her labs updated today.  Lab Results  Component Value Date   VITAMINB12 224 12/10/2018   Last vitamin D Lab Results  Component Value Date   VD25OH 22.78 (L) 12/10/2018   Insulin Resistance Hx of elevated HgbA1c. Could not tolerate GLP-1 due to GI distress, oral GLP-1 was not covered by insurance. Has had 3 lb weight gain over the past few months. Has started exercising. Does drink regular sodas and eat fast food regularly.   Past Medical History:  Diagnosis Date  . ADD (attention deficit disorder) without hyperactivity   . Anxiety   . Asthma    as a child  . Cervical dysplasia   . Family history of breast cancer   . Family history of lymphoma   . Family history of melanoma   . Headache   . Migraine with aura   . PONV (postoperative nausea and vomiting)   . Pre-diabetes    gestational diabetes also  . Pure hypercholesterolemia 12/20/2018     Social History   Tobacco Use  . Smoking status: Current Every Day  Smoker    Packs/day: 0.25    Years: 10.00    Pack years: 2.50    Types: Cigarettes  . Smokeless tobacco: Never Used  Substance Use Topics  . Alcohol use: Not Currently  . Drug use: No    Past Surgical History:  Procedure Laterality Date  . CERVICAL BIOPSY  W/ LOOP ELECTRODE EXCISION    . CESAREAN SECTION     x 1  . CYSTOSCOPY N/A 06/02/2018   Procedure: CYSTOSCOPY;  Surgeon: Salvadore Dom, MD;  Location: [facility];  Service: Gynecology;  Laterality: N/A;  . DILITATION & CURRETTAGE/HYSTROSCOPY WITH NOVASURE ABLATION N/A 04/01/2015   Procedure: DILATATION & CURETTAGE/HYSTEROSCOPY WITH NOVASURE ABLATION;  Surgeon: [name], MD;  Location: [facility];  Service: Gynecology;  Laterality: N/A;  . ENDOMETRIAL ABLATION    . TONSILLECTOMY    . TOTAL LAPAROSCOPIC HYSTERECTOMY WITH SALPINGECTOMY Bilateral 06/02/2018   Procedure: TOTAL LAPAROSCOPIC HYSTERECTOMY WITH SALPINGECTOMY;  Surgeon: Salvadore Dom, MD;  Location: [facility];  Service: Gynecology;  Laterality: Bilateral;  1.5 hours OR time per Dr Talbert Nan. Will need extended recovery bed.  . TUBAL LIGATION      Family History  Problem Relation Age of Onset  . Breast cancer Mother 86       "3x"  . Lung cancer Father   . Breast cancer Maternal Aunt 49  . Lymphoma Paternal Grandmother 64  . Colon cancer Maternal Grandfather 30  .  Melanoma Paternal Grandfather 59  . Melanoma Cousin     Allergies  Allergen Reactions  . Penicillins Anaphylaxis, Shortness Of Breath and Other (See Comments)    Tachycardia  Has patient had a PCN reaction causing immediate rash, facial/tongue/throat swelling, SOB or lightheadedness with hypotension: Yes Has patient had a PCN reaction causing severe rash involving mucus membranes or skin necrosis: No Has patient had a PCN reaction that required hospitalization: No Has patient had a PCN reaction occurring within the last 10 years: Yes If all of the above  answers are "NO", then may proceed with Cephalosporin use.   . Bupropion Rash    Blister    Current Medications:   Current Outpatient Medications:  .  acetaminophen (TYLENOL) 325 MG tablet, Take 2 tablets (650 mg total) by mouth every 4 (four) hours as needed for mild pain., Disp: 30 tablet, Rfl: 0 .  albuterol (VENTOLIN HFA) 108 (90 Base) MCG/ACT inhaler, Inhale 2 puffs into the lungs every 6 (six) hours as needed for wheezing or shortness of breath., Disp: 18 g, Rfl: 2 .  amphetamine-dextroamphetamine (ADDERALL) 20 MG tablet, Take 1 tablet (20 mg total) by mouth 2 (two) times daily., Disp: 60 tablet, Rfl: 0 .  cyanocobalamin (,VITAMIN B-12,) 1000 MCG/ML injection, 1000 mcg (1 mg) injection once per week for four weeks, followed by 1000 mcg injection once per month., Disp: 1 mL, Rfl: 15 .  diazepam (VALIUM) 5 MG tablet, Take 1 tablet (5 mg total) by mouth every 12 (twelve) hours as needed for anxiety., Disp: 30 tablet, Rfl: 1 .  FLUoxetine (PROZAC) 20 MG capsule, Take 1 capsule (20 mg total) by mouth every morning., Disp: 90 capsule, Rfl: 1 .  lisdexamfetamine (VYVANSE) 70 MG capsule, Take 1 capsule (70 mg total) by mouth daily before breakfast., Disp: 30 capsule, Rfl: 0 .  [START ON 02/18/2020] lisdexamfetamine (VYVANSE) 70 MG capsule, Take 1 capsule (70 mg total) by mouth daily before breakfast., Disp: 30 capsule, Rfl: 0 .  [START ON 03/19/2020] lisdexamfetamine (VYVANSE) 70 MG capsule, Take 1 capsule (70 mg total) by mouth daily before breakfast., Disp: 30 capsule, Rfl: 0 .  losartan-hydrochlorothiazide (HYZAAR) 50-12.5 MG tablet, Take 1 tablet by mouth daily., Disp: 90 tablet, Rfl: 1 .  promethazine (PHENERGAN) 25 MG tablet, Take 1 tablet (25 mg total) by mouth every 8 (eight) hours as needed for nausea or vomiting., Disp: 30 tablet, Rfl: 2 .  propranolol (INDERAL) 20 MG tablet, TAKE 1 TABLET BY MOUTH TWICE DAILY AS NEEDED FOR PALPITATIONS, Disp: 60 tablet, Rfl: 2 .  SUMAtriptan (IMITREX) 25  MG tablet, Take 1 tablet (25 mg total) by mouth every 2 (two) hours as needed for migraine. May repeat in 2 hours if headache persists or recurs., Disp: 10 tablet, Rfl: 0   Review of Systems:   ROS  Negative unless otherwise specified per HPI.  Vitals:   Vitals:   01/27/20 1300  BP: 128/78  Pulse: 78  Temp: 98.2 F (36.8 C)  TempSrc: Temporal  SpO2: 97%  Weight: 173 lb 9.6 oz (78.7 kg)  Height: 5\' 2"  (1.575 m)     Body mass index is 31.75 kg/m.  Physical Exam:   Physical Exam Vitals and nursing note reviewed.  Constitutional:      General: She is not in acute distress.    Appearance: She is well-developed. She is not ill-appearing or toxic-appearing.  Cardiovascular:     Rate and Rhythm: Normal rate and regular rhythm.     Pulses:  Normal pulses.     Heart sounds: Normal heart sounds, S1 normal and S2 normal.     Comments: No LE edema Pulmonary:     Effort: Pulmonary effort is normal.     Breath sounds: Normal breath sounds.  Skin:    General: Skin is warm and dry.  Neurological:     Mental Status: She is alert.     GCS: GCS eye subscore is 4. GCS verbal subscore is 5. GCS motor subscore is 6.  Psychiatric:        Speech: Speech normal.        Behavior: Behavior normal. Behavior is cooperative.      Assessment and Plan:   Haley Mcdonald was seen today for hypertension.  Diagnoses and all orders for this visit:  Essential hypertension Uncontrolled per patient. Will stop chlorthalidone and start Hyzaar 50-12.5 mg. Follow-up in 1 month, sooner if concerns to recheck. -     TSH  Fatigue, unspecified type Will update labs today. Encouraged healthier diet, better sleep, more water.  -     Comprehensive metabolic panel -     CBC with Differential/Platelet  Vitamin D deficiency Update labs and provide recommendations for supplementation accordingly. -     VITAMIN D 25 Hydroxy (Vit-D Deficiency, Fractures)  B12 deficiency Update labs and provide recommendations  for supplementation accordingly. -     Vitamin B12  Nevus of upper arm, left -     Ambulatory referral to Dermatology  Anxiety Stop Lexapro and will start 20 mg prozac daily to see if this helps her mood with less sexual side effects. Follow-up in 1 month, sooner if concerns.  Insulin resistance Encouraged healthier lifestyle and soda reduction. Update A1c and provide recommendations as able.  Other orders -     losartan-hydrochlorothiazide (HYZAAR) 50-12.5 MG tablet; Take 1 tablet by mouth daily. -     promethazine (PHENERGAN) 25 MG tablet; Take 1 tablet (25 mg total) by mouth every 8 (eight) hours as needed for nausea or vomiting. -     FLUoxetine (PROZAC) 20 MG capsule; Take 1 capsule (20 mg total) by mouth every morning.   . Reviewed expectations re: course of current medical issues. . Discussed self-management of symptoms. . Outlined signs and symptoms indicating need for more acute intervention. . Patient verbalized understanding and all questions were answered. . See orders for this visit as documented in the electronic medical record. . Patient received an After-Visit Summary.  Inda Coke, PA-C

## 2020-01-29 ENCOUNTER — Other Ambulatory Visit: Payer: Self-pay

## 2020-01-29 MED ORDER — BLOOD GLUCOSE MONITOR KIT: PACK | 0 refills | Status: DC

## 2020-02-09 ENCOUNTER — Ambulatory Visit (INDEPENDENT_AMBULATORY_CARE_PROVIDER_SITE_OTHER): Admitting: Family Medicine

## 2020-02-09 ENCOUNTER — Other Ambulatory Visit: Payer: Self-pay | Admitting: Physician Assistant

## 2020-02-09 DIAGNOSIS — F411 Generalized anxiety disorder: Secondary | ICD-10-CM

## 2020-02-10 ENCOUNTER — Other Ambulatory Visit: Payer: Self-pay | Admitting: Physician Assistant

## 2020-02-17 ENCOUNTER — Other Ambulatory Visit: Payer: Self-pay | Admitting: *Deleted

## 2020-02-17 MED ORDER — DOXYCYCLINE HYCLATE 100 MG PO TABS: 100.0000 mg | ORAL_TABLET | Freq: Two times a day (BID) | ORAL | 0 refills | Status: DC

## 2020-02-21 ENCOUNTER — Other Ambulatory Visit: Payer: Self-pay | Admitting: Physician Assistant

## 2020-04-27 ENCOUNTER — Ambulatory Visit (INDEPENDENT_AMBULATORY_CARE_PROVIDER_SITE_OTHER): Admitting: Physician Assistant

## 2020-04-27 ENCOUNTER — Other Ambulatory Visit: Payer: Self-pay

## 2020-04-27 ENCOUNTER — Encounter: Payer: Self-pay | Admitting: Physician Assistant

## 2020-04-27 VITALS — BP 118/78 | HR 84 | Temp 97.4°F | Ht 62.0 in | Wt 168.0 lb

## 2020-04-27 DIAGNOSIS — E8881 Metabolic syndrome: Secondary | ICD-10-CM | POA: Diagnosis not present

## 2020-04-27 LAB — POCT GLYCOSYLATED HEMOGLOBIN (HGB A1C): Hemoglobin A1C: 5.8 % — AB (ref 4.0–5.6)

## 2020-04-27 NOTE — Progress Notes (Signed)
Haley Mcdonald is a 39 y.o. female is here to discuss:  I acted as a Education administrator for [employer], PA-C Anselmo Pickler, LPN   History of Present Illness:   Chief Complaint  Patient presents with  . Prediabetes    HPI   Prediabetes Pt following up today, doing well overall. She has reduced her soda consumption and worked on eating less sweets. She is also down 5 lb! Continues to work towards healthy lifestyle. Lab Results  Component Value Date   HGBA1C 5.8 (A) 04/27/2020   Wt Readings from Last 3 Encounters:  04/27/20 168 lb (76.2 kg)  01/27/20 173 lb 9.6 oz (78.7 kg)  11/23/19 170 lb (77.1 kg)        Health Maintenance Due  Topic Date Due  . Hepatitis C Screening  Never done  . COVID-19 Vaccine (2 - Pfizer 2-dose series) 02/03/2020  . INFLUENZA VACCINE  03/20/2020    Past Medical History:  Diagnosis Date  . ADD (attention deficit disorder) without hyperactivity   . Anxiety   . Asthma    as a child  . Cervical dysplasia   . Family history of breast cancer   . Family history of lymphoma   . Family history of melanoma   . Headache   . Migraine with aura   . PONV (postoperative nausea and vomiting)   . Pre-diabetes    gestational diabetes also  . Pure hypercholesterolemia 12/20/2018     Social History   Tobacco Use  . Smoking status: Current Every Day Smoker    Packs/day: 0.25    Years: 10.00    Pack years: 2.50    Types: Cigarettes  . Smokeless tobacco: Never Used  Vaping Use  . Vaping Use: Never used  Substance Use Topics  . Alcohol use: Not Currently  . Drug use: No    Past Surgical History:  Procedure Laterality Date  . CERVICAL BIOPSY  W/ LOOP ELECTRODE EXCISION    . CESAREAN SECTION     x 1  . CYSTOSCOPY N/A 06/02/2018   Procedure: CYSTOSCOPY;  Surgeon: Salvadore Dom, MD;  Location: [facility];  Service: Gynecology;  Laterality: N/A;  . DILITATION & CURRETTAGE/HYSTROSCOPY WITH NOVASURE ABLATION N/A  04/01/2015   Procedure: DILATATION & CURETTAGE/HYSTEROSCOPY WITH NOVASURE ABLATION;  Surgeon: [name], MD;  Location: [facility];  Service: Gynecology;  Laterality: N/A;  . ENDOMETRIAL ABLATION    . TONSILLECTOMY    . TOTAL LAPAROSCOPIC HYSTERECTOMY WITH SALPINGECTOMY Bilateral 06/02/2018   Procedure: TOTAL LAPAROSCOPIC HYSTERECTOMY WITH SALPINGECTOMY;  Surgeon: Salvadore Dom, MD;  Location: [facility];  Service: Gynecology;  Laterality: Bilateral;  1.5 hours OR time per Dr Talbert Nan. Will need extended recovery bed.  . TUBAL LIGATION      Family History  Problem Relation Age of Onset  . Breast cancer Mother 62       "3x"  . Lung cancer Father   . Breast cancer Maternal Aunt 28  . Lymphoma Paternal Grandmother 69  . Colon cancer Maternal Grandfather 84  . Melanoma Paternal Grandfather 66  . Melanoma Cousin     PMHx, SurgHx, SocialHx, FamHx, Medications, and Allergies were reviewed in the Visit Navigator and updated as appropriate.   Patient Active Problem List   Diagnosis Date Noted  . Essential hypertension 09/23/2019  . B12 deficiency 09/23/2019  . Migraine without aura and without status migrainosus, not intractable 09/23/2019  . Anxiety and depression 07/08/2019  . At high  risk for breast cancer 07/06/2019  . Breast cancer screening, high risk patient 07/06/2019  . Elevated hemoglobin ([place]) 12/20/2018  . Vitamin D deficiency 12/20/2018  . Mixed hyperlipidemia 12/20/2018  . Tobacco dependence 06/12/2018  . Status post laparoscopic hysterectomy 06/02/2018  . Insulin resistance   . Genetic testing 03/21/2018  . Family history of breast cancer   . Family history of melanoma   . Family history of lymphoma   . Cervical dysplasia   . 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    Social History   Tobacco Use  . Smoking status: Current Every Day Smoker    Packs/day: 0.25    Years: 10.00    Pack years: 2.50    Types:  Cigarettes  . Smokeless tobacco: Never Used  Vaping Use  . Vaping Use: Never used  Substance Use Topics  . Alcohol use: Not Currently  . Drug use: No    Current Medications and Allergies:    Current Outpatient Medications:  .  amphetamine-dextroamphetamine (ADDERALL) 20 MG tablet, Take 1 tablet (20 mg total) by mouth 2 (two) times daily., Disp: 60 tablet, Rfl: 0 .  diazepam (VALIUM) 5 MG tablet, Take 1 tablet (5 mg total) by mouth every 12 (twelve) hours as needed for anxiety., Disp: 30 tablet, Rfl: 1 .  FLUoxetine (PROZAC) 20 MG capsule, Take 1 capsule (20 mg total) by mouth every morning., Disp: 90 capsule, Rfl: 1 .  losartan-hydrochlorothiazide (HYZAAR) 50-12.5 MG tablet, Take 1 tablet by mouth daily., Disp: 90 tablet, Rfl: 1 .  promethazine (PHENERGAN) 25 MG tablet, Take 1 tablet (25 mg total) by mouth every 8 (eight) hours as needed for nausea or vomiting., Disp: 30 tablet, Rfl: 2 .  SUMAtriptan (IMITREX) 25 MG tablet, Take 1 tablet (25 mg total) by mouth every 2 (two) hours as needed for migraine. May repeat in 2 hours if headache persists or recurs., Disp: 10 tablet, Rfl: 0 .  lisdexamfetamine (VYVANSE) 70 MG capsule, Take 1 capsule (70 mg total) by mouth daily before breakfast., Disp: 30 capsule, Rfl: 0 .  lisdexamfetamine (VYVANSE) 70 MG capsule, Take 1 capsule (70 mg total) by mouth daily before breakfast., Disp: 30 capsule, Rfl: 0 .  lisdexamfetamine (VYVANSE) 70 MG capsule, Take 1 capsule (70 mg total) by mouth daily before breakfast., Disp: 30 capsule, Rfl: 0 .  propranolol (INDERAL) 20 MG tablet, TAKE 1 TABLET BY MOUTH TWICE DAILY AS NEEDED FOR PALPITATIONS (Patient not taking: Reported on 04/27/2020), Disp: 60 tablet, Rfl: 2   Allergies  Allergen Reactions  . Penicillins Anaphylaxis, Shortness Of Breath and Other (See Comments)    Tachycardia  Has patient had a PCN reaction causing immediate rash, facial/tongue/throat swelling, SOB or lightheadedness with hypotension:  Yes Has patient had a PCN reaction causing severe rash involving mucus membranes or skin necrosis: No Has patient had a PCN reaction that required hospitalization: No Has patient had a PCN reaction occurring within the last 10 years: Yes If all of the above answers are "NO", then may proceed with Cephalosporin use.   . Bupropion Rash    Blister    Review of Systems   ROS  Negative unless otherwise specified per HPI.  Vitals:   Vitals:   04/27/20 1434  BP: 118/78  Pulse: 84  Temp: (!) 97.4 F (36.3 C)  TempSrc: Temporal  SpO2: 98%  Weight: 168 lb (76.2 kg)  Height: 5\' 2"  (1.575 m)     Body mass index is 30.73 kg/m.  Physical Exam:    Physical Exam Vitals and nursing note reviewed.  Constitutional:      General: She is not in acute distress.    Appearance: She is well-developed. She is not ill-appearing or toxic-appearing.  Cardiovascular:     Rate and Rhythm: Normal rate and regular rhythm.     Pulses: Normal pulses.     Heart sounds: Normal heart sounds, S1 normal and S2 normal.     Comments: No LE edema Pulmonary:     Effort: Pulmonary effort is normal.     Breath sounds: Normal breath sounds.  Skin:    General: Skin is warm and dry.  Neurological:     Mental Status: She is alert.     GCS: GCS eye subscore is 4. GCS verbal subscore is 5. GCS motor subscore is 6.  Psychiatric:        Speech: Speech normal.        Behavior: Behavior normal. Behavior is cooperative.    Results for orders placed or performed in visit on 04/27/20  POCT HgB A1C  Result Value Ref Range   Hemoglobin A1C 5.8 (A) 4.0 - 5.6 %   HbA1c POC (<> result, manual entry)     HbA1c, POC (prediabetic range)     HbA1c, POC (controlled diabetic range)        Assessment and Plan:    Jayra was seen today for prediabetes.  Diagnoses and all orders for this visit:  Insulin resistance -     POCT HgB A1C   Doing well with current lifestyle changes. Continue healthy lifestyle  efforts. Follow-up in 6 months for recheck, sooner if concerns.  CMA or LPN served as scribe during this visit. History, Physical, and Plan performed by medical provider. The above documentation has been reviewed and is accurate and complete.   Inda Coke, PA-C [place], [place] 04/27/2020  Follow-up: No follow-ups on file.

## 2020-05-11 ENCOUNTER — Other Ambulatory Visit: Payer: Self-pay

## 2020-05-11 ENCOUNTER — Encounter: Payer: Self-pay | Admitting: Dermatology

## 2020-05-11 ENCOUNTER — Ambulatory Visit (INDEPENDENT_AMBULATORY_CARE_PROVIDER_SITE_OTHER): Admitting: Dermatology

## 2020-05-11 DIAGNOSIS — L821 Other seborrheic keratosis: Secondary | ICD-10-CM

## 2020-05-11 DIAGNOSIS — Z1283 Encounter for screening for malignant neoplasm of skin: Secondary | ICD-10-CM | POA: Diagnosis not present

## 2020-05-11 NOTE — Patient Instructions (Addendum)
First visit in years for Haley Mcdonald date of birth 11-04-1980.  She was kind enough to let me look at her back where there are no atypical moles or skin cancer.  Her chief concern was a relatively new pigmented spot beyond the left outer elbow crease.  This 2-1/2 mm monochrome subtly textured sharply defined papule is typical of a benign seborrheic keratosis; dermoscopy confirms.  I explained that while biopsy is 443% certain, if it is clinically stable this is really not a necessary procedure and Sharda was comfortable leaving the notes unless there is obvious clinical change. follow-up can be on a as needed basis

## 2020-06-06 ENCOUNTER — Other Ambulatory Visit: Payer: Self-pay | Admitting: Physician Assistant

## 2020-06-06 MED ORDER — LISDEXAMFETAMINE DIMESYLATE 70 MG PO CAPS: 70.0000 mg | ORAL_CAPSULE | Freq: Every day | ORAL | 0 refills | Status: DC

## 2020-06-09 NOTE — Progress Notes (Signed)
   New Patient   Subjective  Emalea Brynnleigh Mcelwee is a 39 y.o. female who presents for the following: Skin Problem (left upper forearm x months change itch).  Dark lesion  Location: left arm Duration:  Quality: itches Associated Signs/Symptoms: Modifying Factors:  Severity:  Timing: Context:    The following portions of the chart were reviewed this encounter and updated as appropriate: Tobacco  Allergies  Meds  Problems  Med Hx  Surg Hx  Fam Hx      Objective  Well appearing patient in no apparent distress; mood and affect are within normal limits.  All skin waist up examined.   Assessment & Plan    First visit in years for Aicha Clingenpeel date of birth 06/16/1981.  She was kind enough to let me look at her back where there are no atypical moles or skin cancer.  Her chief concern was a relatively new pigmented spot beyond the left outer elbow crease.  This 2-1/2 mm monochrome subtly textured sharply defined papule is typical of a benign seborrheic keratosis; dermoscopy confirms.  I explained that while biopsy is 511% certain, if it is clinically stable this is really not a necessary procedure and Primrose was comfortable leaving the notes unless there is obvious clinical change follow-up can be on a as needed basis  Encounter for screening for malignant neoplasm of skin Mid Back  Yearly skin check.  Seborrheic keratosis Left Elbow - Posterior  Okay to leave if stable

## 2020-06-18 ENCOUNTER — Encounter: Payer: Self-pay | Admitting: Dermatology

## 2020-07-13 ENCOUNTER — Other Ambulatory Visit: Payer: Self-pay | Admitting: Physician Assistant

## 2020-07-13 MED ORDER — LISDEXAMFETAMINE DIMESYLATE 70 MG PO CAPS: 70.0000 mg | ORAL_CAPSULE | Freq: Every day | ORAL | 0 refills | Status: DC

## 2020-08-06 ENCOUNTER — Other Ambulatory Visit: Payer: Self-pay | Admitting: Physician Assistant

## 2020-08-15 ENCOUNTER — Encounter: Payer: Self-pay | Admitting: Physician Assistant

## 2020-08-15 ENCOUNTER — Telehealth (INDEPENDENT_AMBULATORY_CARE_PROVIDER_SITE_OTHER): Admitting: Physician Assistant

## 2020-08-15 VITALS — Temp 97.6°F | Ht 62.0 in | Wt 162.0 lb

## 2020-08-15 DIAGNOSIS — R059 Cough, unspecified: Secondary | ICD-10-CM

## 2020-08-15 MED ORDER — HYDROCOD POLST-CPM POLST ER 10-8 MG/5ML PO SUER: 5.0000 mL | Freq: Every evening | ORAL | 0 refills | Status: DC | PRN

## 2020-08-15 MED ORDER — PREDNISONE 20 MG PO TABS: 40.0000 mg | ORAL_TABLET | Freq: Every day | ORAL | 0 refills | Status: DC

## 2020-08-15 MED ORDER — AZITHROMYCIN 250 MG PO TABS: ORAL_TABLET | ORAL | 0 refills | Status: DC

## 2020-08-15 NOTE — Progress Notes (Signed)
Virtual Visit via Video   I connected with Haley Mcdonald on 08/15/20 at  4:00 PM EST by a video enabled telemedicine application and verified that I am speaking with the correct person using two identifiers. Location patient: Home Location provider: [facility], Office Persons participating in the virtual visit: Haley Mcdonald, Jarold Motto PA-C, Corky Mull, LPN   I discussed the limitations of evaluation and management by telemedicine and the availability of in person appointments. The patient expressed understanding and agreed to proceed.  I acted as a Neurosurgeon for [employer], [employer], LPN   Subjective:   HPI:   Cough Pt c/o cough x 1 week, nasal congestion with green drainage. Having some wheezing and chest discomfort with coughing. Did home COVID test that was negative. Denies sick contacts, fevers, chills, n/v/d. She is a current smoker.   ROS: See pertinent positives and negatives per HPI.  Patient Active Problem List   Diagnosis Date Noted  . Essential hypertension 09/23/2019  . B12 deficiency 09/23/2019  . Migraine without aura and without status migrainosus, not intractable 09/23/2019  . Anxiety and depression 07/08/2019  . At high risk for breast cancer 07/06/2019  . Breast cancer screening, high risk patient 07/06/2019  . Elevated hemoglobin (HCC) 12/20/2018  . Vitamin D deficiency 12/20/2018  . Mixed hyperlipidemia 12/20/2018  . Tobacco dependence 06/12/2018  . Status post laparoscopic hysterectomy 06/02/2018  . Insulin resistance   . Genetic testing 03/21/2018  . Family history of breast cancer   . Family history of melanoma   . Family history of lymphoma   . Cervical dysplasia   . 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    Social History   Tobacco Use  . Smoking status: Current Every Day Smoker    Packs/day: 0.25    Years: 10.00    Pack years: 2.50    Types: Cigarettes  . Smokeless  tobacco: Never Used  Substance Use Topics  . Alcohol use: Not Currently    Current Outpatient Medications:  .  amphetamine-dextroamphetamine (ADDERALL) 20 MG tablet, Take 1 tablet (20 mg total) by mouth 2 (two) times daily., Disp: 60 tablet, Rfl: 0 .  azithromycin (ZITHROMAX) 250 MG tablet, Take two tablets on day one, then one daily x 4 days, Disp: 6 tablet, Rfl: 0 .  chlorpheniramine-HYDROcodone (TUSSIONEX PENNKINETIC ER) 10-8 MG/5ML SUER, Take 5 mLs by mouth at bedtime as needed for cough., Disp: 115 mL, Rfl: 0 .  diazepam (VALIUM) 5 MG tablet, Take 1 tablet (5 mg total) by mouth every 12 (twelve) hours as needed for anxiety., Disp: 30 tablet, Rfl: 1 .  FLUoxetine (PROZAC) 20 MG capsule, TAKE 1 CAPSULE(20 MG) BY MOUTH EVERY MORNING, Disp: 90 capsule, Rfl: 1 .  lisdexamfetamine (VYVANSE) 70 MG capsule, Take 1 capsule (70 mg total) by mouth daily., Disp: 90 capsule, Rfl: 0 .  losartan-hydrochlorothiazide (HYZAAR) 50-12.5 MG tablet, TAKE 1 TABLET BY MOUTH DAILY, Disp: 90 tablet, Rfl: 1 .  predniSONE (DELTASONE) 20 MG tablet, Take 2 tablets (40 mg total) by mouth daily., Disp: 10 tablet, Rfl: 0 .  promethazine (PHENERGAN) 25 MG tablet, Take 1 tablet (25 mg total) by mouth every 8 (eight) hours as needed for nausea or vomiting., Disp: 30 tablet, Rfl: 2 .  propranolol (INDERAL) 20 MG tablet, TAKE 1 TABLET BY MOUTH TWICE DAILY AS NEEDED FOR PALPITATIONS, Disp: 60 tablet, Rfl: 2 .  SUMAtriptan (IMITREX) 25 MG tablet, Take 1 tablet (25 mg total) by mouth  every 2 (two) hours as needed for migraine. May repeat in 2 hours if headache persists or recurs., Disp: 10 tablet, Rfl: 0  Allergies  Allergen Reactions  . Penicillins Anaphylaxis, Shortness Of Breath and Other (See Comments)    Tachycardia  Has patient had a PCN reaction causing immediate rash, facial/tongue/throat swelling, SOB or lightheadedness with hypotension: Yes Has patient had a PCN reaction causing severe rash involving mucus membranes or  skin necrosis: No Has patient had a PCN reaction that required hospitalization: No Has patient had a PCN reaction occurring within the last 10 years: Yes If all of the above answers are "NO", then may proceed with Cephalosporin use.   . Bupropion Rash    Blister    Objective:   VITALS: Per patient if applicable, see vitals. GENERAL: Alert, appears well and in no acute distress. HEENT: Atraumatic, conjunctiva clear, no obvious abnormalities on inspection of external nose and ears. NECK: Normal movements of the head and neck. CARDIOPULMONARY: No increased WOB. Speaking in clear sentences. I:E ratio WNL.  MS: Moves all visible extremities without noticeable abnormality. PSYCH: Pleasant and cooperative, well-groomed. Speech normal rate and rhythm. Affect is appropriate. Insight and judgement are appropriate. Attention is focused, linear, and appropriate.  NEURO: CN grossly intact. Oriented as arrived to appointment on time with no prompting. Moves both UE equally.  SKIN: No obvious lesions, wounds, erythema, or cyanosis noted on face or hands.  Assessment and Plan:   [name] was seen today for cough.  Diagnoses and all orders for this visit:  Cough No red flags on discussion.  Will initiate oral prednisone, z-pack, and tussionex prn for bronchitis per orders. Discussed taking medications as prescribed. Reviewed return precautions including worsening fever, SOB, worsening cough or other concerns. Push fluids and rest. I recommend that patient follow-up if symptoms worsen or persist despite treatment x 7-10 days, sooner if needed.  Other orders -     predniSONE (DELTASONE) 20 MG tablet; Take 2 tablets (40 mg total) by mouth daily. -     chlorpheniramine-HYDROcodone (TUSSIONEX PENNKINETIC ER) 10-8 MG/5ML SUER; Take 5 mLs by mouth at bedtime as needed for cough. -     azithromycin (ZITHROMAX) 250 MG tablet; Take two tablets on day one, then one daily x 4 days  I discussed the assessment  and treatment plan with the patient. The patient was provided an opportunity to ask questions and all were answered. The patient agreed with the plan and demonstrated an understanding of the instructions.   The patient was advised to call back or seek an in-person evaluation if the symptoms worsen or if the condition fails to improve as anticipated.   CMA or LPN served as scribe during this visit. History, Physical, and Plan performed by medical provider. The above documentation has been reviewed and is accurate and complete.  [place], [place] 08/15/2020

## 2020-09-12 ENCOUNTER — Other Ambulatory Visit: Payer: Self-pay | Admitting: Physician Assistant

## 2020-09-12 MED ORDER — ONDANSETRON HCL 4 MG PO TABS: 4.0000 mg | ORAL_TABLET | Freq: Three times a day (TID) | ORAL | 0 refills | Status: DC | PRN

## 2020-09-13 MED ORDER — ONDANSETRON HCL 4 MG PO TABS: 4.0000 mg | ORAL_TABLET | Freq: Three times a day (TID) | ORAL | 0 refills | Status: DC | PRN

## 2020-09-13 NOTE — Addendum Note (Signed)
Addended by: Marian Sorrow on: 09/13/2020 03:18 PM   Modules accepted: Orders

## 2020-10-05 ENCOUNTER — Other Ambulatory Visit: Payer: Self-pay | Admitting: Physician Assistant

## 2020-10-05 DIAGNOSIS — R059 Cough, unspecified: Secondary | ICD-10-CM

## 2020-11-16 ENCOUNTER — Ambulatory Visit (INDEPENDENT_AMBULATORY_CARE_PROVIDER_SITE_OTHER): Admitting: Physician Assistant

## 2020-11-16 ENCOUNTER — Encounter: Payer: Self-pay | Admitting: Physician Assistant

## 2020-11-16 VITALS — BP 98/70 | HR 91 | Temp 98.0°F | Ht 62.0 in | Wt 166.5 lb

## 2020-11-16 DIAGNOSIS — F419 Anxiety disorder, unspecified: Secondary | ICD-10-CM

## 2020-11-16 DIAGNOSIS — E559 Vitamin D deficiency, unspecified: Secondary | ICD-10-CM | POA: Diagnosis not present

## 2020-11-16 DIAGNOSIS — E538 Deficiency of other specified B group vitamins: Secondary | ICD-10-CM | POA: Diagnosis not present

## 2020-11-16 DIAGNOSIS — I10 Essential (primary) hypertension: Secondary | ICD-10-CM | POA: Diagnosis not present

## 2020-11-16 DIAGNOSIS — E8881 Metabolic syndrome: Secondary | ICD-10-CM

## 2020-11-16 DIAGNOSIS — F32A Depression, unspecified: Secondary | ICD-10-CM

## 2020-11-16 DIAGNOSIS — F988 Other specified behavioral and emotional disorders with onset usually occurring in childhood and adolescence: Secondary | ICD-10-CM

## 2020-11-16 MED ORDER — LISDEXAMFETAMINE DIMESYLATE 70 MG PO CAPS: 70.0000 mg | ORAL_CAPSULE | Freq: Every day | ORAL | 0 refills | Status: DC

## 2020-11-16 NOTE — Progress Notes (Signed)
Haley Mcdonald is a 40 y.o. female is here for follow up.  I acted as a Education administrator for [employer], PA-C Anselmo Pickler, LPN   History of Present Illness:   Chief Complaint  Patient presents with  . Hypertension  . ADD    HPI   Hypertension Currently taking Losartan-HCTZ 50-12.5 mg daily. She is not checking blood pressure. Pt has been having a headache since Sunday. Pt denies dizziness, blurred vision, chest pain, SOB or lower leg edema. Denies excessive caffeine intake, stimulant usage, excessive alcohol intake or increase in salt consumption.   ADD Currently taking Vyvanse 70 mg daily. Tolerating well, working well. Denies palpitations or insomnia.   Anxiety and Depression Currently taking prozac 20 mg daily. Tolerating well, denies side effects. Denies SI/HI.   Insulin resistance Not on medications. Trying to work on diet but still drinking regular sodas. No regular scheduled exercise.   Vit D and B12 deficiency Doesn't take regular supplements. Would like blood work updated today.  Health Maintenance Due  Topic Date Due  . Hepatitis C Screening  Never done  . COVID-19 Vaccine (3 - Pfizer risk 4-dose series) 03/02/2020    Past Medical History:  Diagnosis Date  . ADD (attention deficit disorder) without hyperactivity   . Anxiety   . Asthma    as a child  . Cervical dysplasia   . Family history of breast cancer   . Family history of lymphoma   . Family history of melanoma   . Headache   . Migraine with aura   . PONV (postoperative nausea and vomiting)   . Pre-diabetes    gestational diabetes also  . Pure hypercholesterolemia 12/20/2018     Social History   Tobacco Use  . Smoking status: Current Every Day Smoker    Packs/day: 0.25    Years: 10.00    Pack years: 2.50    Types: Cigarettes  . Smokeless tobacco: Never Used  Vaping Use  . Vaping Use: Never used  Substance Use Topics  . Alcohol use: Not Currently  . Drug use: No    Past  Surgical History:  Procedure Laterality Date  . CERVICAL BIOPSY  W/ LOOP ELECTRODE EXCISION    . CESAREAN SECTION     x 1  . CYSTOSCOPY N/A 06/02/2018   Procedure: CYSTOSCOPY;  Surgeon: Salvadore Dom, MD;  Location[facility];  Service: Gynecology;  Laterality: N/A;  . DILITATION & CURRETTAGE/HYSTROSCOPY WITH NOVASURE ABLATION N/A 04/01/2015   Procedure: DILATATION & CURETTAGE/HYSTEROSCOPY WITH NOVASURE ABLATION;  Surgeon: [name], MD;  Location: [facility];  Service: Gynecology;  Laterality: N/A;  . ENDOMETRIAL ABLATION    . TONSILLECTOMY    . TOTAL LAPAROSCOPIC HYSTERECTOMY WITH SALPINGECTOMY Bilateral 06/02/2018   Procedure: TOTAL LAPAROSCOPIC HYSTERECTOMY WITH SALPINGECTOMY;  Surgeon: Salvadore Dom, MD;  [facility];  Service: Gynecology;  Laterality: Bilateral;  1.5 hours OR time per Dr Talbert Nan. Will need extended recovery bed.  . TUBAL LIGATION      Family History  Problem Relation Age of Onset  . Breast cancer Mother 4       "3x"  . Lung cancer Father   . Breast cancer Maternal Aunt 63  . Lymphoma Paternal Grandmother 65  . Colon cancer Maternal Grandfather 39  . Melanoma Paternal Grandfather 41  . Melanoma Cousin     PMHx, SurgHx, SocialHx, FamHx, Medications, and Allergies were reviewed in the Visit Navigator and updated as appropriate.  Patient Active Problem List   Diagnosis Date Noted  . Essential hypertension 09/23/2019  . B12 deficiency 09/23/2019  . Migraine without aura and without status migrainosus, not intractable 09/23/2019  . Anxiety and depression 07/08/2019  . At high risk for breast cancer 07/06/2019  . Breast cancer screening, high risk patient 07/06/2019  . Elevated hemoglobin ([place]) 12/20/2018  . Vitamin D deficiency 12/20/2018  . Mixed hyperlipidemia 12/20/2018  . Tobacco dependence 06/12/2018  . Status post laparoscopic hysterectomy 06/02/2018  . Insulin resistance   . Genetic  testing 03/21/2018  . Family history of breast cancer   . Family history of melanoma   . Family history of lymphoma   . Cervical dysplasia   . 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    Social History   Tobacco Use  . Smoking status: Current Every Day Smoker    Packs/day: 0.25    Years: 10.00    Pack years: 2.50    Types: Cigarettes  . Smokeless tobacco: Never Used  Vaping Use  . Vaping Use: Never used  Substance Use Topics  . Alcohol use: Not Currently  . Drug use: No    Current Medications and Allergies:    Current Outpatient Medications:  .  FLUoxetine (PROZAC) 20 MG capsule, TAKE 1 CAPSULE(20 MG) BY MOUTH EVERY MORNING, Disp: 90 capsule, Rfl: 1 .  lisdexamfetamine (VYVANSE) 70 MG capsule, Take 1 capsule (70 mg total) by mouth daily., Disp: 90 capsule, Rfl: 0 .  losartan-hydrochlorothiazide (HYZAAR) 50-12.5 MG tablet, TAKE 1 TABLET BY MOUTH DAILY, Disp: 90 tablet, Rfl: 1 .  ondansetron (ZOFRAN) 4 MG tablet, Take 1 tablet (4 mg total) by mouth every 8 (eight) hours as needed for nausea or vomiting., Disp: 20 tablet, Rfl: 0 .  promethazine (PHENERGAN) 25 MG tablet, Take 1 tablet (25 mg total) by mouth every 8 (eight) hours as needed for nausea or vomiting., Disp: 30 tablet, Rfl: 2 .  SUMAtriptan (IMITREX) 25 MG tablet, Take 1 tablet (25 mg total) by mouth every 2 (two) hours as needed for migraine. May repeat in 2 hours if headache persists or recurs., Disp: 10 tablet, Rfl: 0 .  diazepam (VALIUM) 5 MG tablet, Take 1 tablet (5 mg total) by mouth every 12 (twelve) hours as needed for anxiety. (Patient not taking: Reported on 11/16/2020), Disp: 30 tablet, Rfl: 1   Allergies  Allergen Reactions  . Penicillins Anaphylaxis, Shortness Of Breath and Other (See Comments)    Tachycardia  Has patient had a PCN reaction causing immediate rash, facial/tongue/throat swelling, SOB or lightheadedness with hypotension: Yes Has patient had a PCN reaction causing  severe rash involving mucus membranes or skin necrosis: No Has patient had a PCN reaction that required hospitalization: No Has patient had a PCN reaction occurring within the last 10 years: Yes If all of the above answers are "NO", then may proceed with Cephalosporin use.   . Bupropion Rash    Blister    Review of Systems   ROS Negative unless otherwise specified per HPI.  Vitals:   Vitals:   11/16/20 1322  BP: 98/70  Pulse: 91  Temp: 98 F (36.7 C)  TempSrc: Temporal  SpO2: 97%  Weight: 166 lb 8 oz (75.5 kg)  Height: 5\' 2"  (1.575 m)     Body mass index is 30.45 kg/m.   Physical Exam:    Physical Exam Vitals and nursing note reviewed.  Constitutional:      General: She is not in acute distress.  Appearance: She is well-developed. She is not ill-appearing or toxic-appearing.  Cardiovascular:     Rate and Rhythm: Normal rate and regular rhythm.     Pulses: Normal pulses.     Heart sounds: Normal heart sounds, S1 normal and S2 normal.     Comments: No LE edema Pulmonary:     Effort: Pulmonary effort is normal.     Breath sounds: Normal breath sounds.  Skin:    General: Skin is warm and dry.  Neurological:     Mental Status: She is alert.     GCS: GCS eye subscore is 4. GCS verbal subscore is 5. GCS motor subscore is 6.  Psychiatric:        Speech: Speech normal.        Behavior: Behavior normal. Behavior is cooperative.      Assessment and Plan:    Gal was seen today for hypertension and add.  Diagnoses and all orders for this visit:  Insulin resistance Update A1c. Will provide rec's accordingly. Work on soda cessation. -     Hemoglobin A1c; Future  Essential hypertension Borderline hypotensive today. Push fluids and recheck over next few days. May need to adjust BP medication, she will call office and let us know. Otherwise continue losartan-hctz 50-12.5 mg. -     CBC with Differential/Platelet; Future -     Comprehensive metabolic  panel; Future -     Lipid panel; Future  Vitamin D deficiency; B12 deficiency Update blood work and provide supplementation recommendations accordingly. -     VITAMIN D 25 Hydroxy (Vit-D Deficiency, Fractures); Future -     Vitamin B12; Future  Anxiety and depression Well controlled. Continue prozac 20 mg daily. Follow-up in 1 year, sooner if concerns.  ADHD Well controlled Continue vyvanse 70 mg daily. Follow-up in 6 months, sooner if concerns  CMA or LPN served as scribe during this visit. History, Physical, and Plan performed by medical provider. The above documentation has been reviewed and is accurate and complete.  Inda Coke, PA-C [place], [place] 11/16/2020  Follow-up: No follow-ups on file.

## 2020-11-17 ENCOUNTER — Other Ambulatory Visit (INDEPENDENT_AMBULATORY_CARE_PROVIDER_SITE_OTHER)

## 2020-11-17 DIAGNOSIS — E559 Vitamin D deficiency, unspecified: Secondary | ICD-10-CM | POA: Diagnosis not present

## 2020-11-17 DIAGNOSIS — E8881 Metabolic syndrome: Secondary | ICD-10-CM

## 2020-11-17 DIAGNOSIS — E538 Deficiency of other specified B group vitamins: Secondary | ICD-10-CM | POA: Diagnosis not present

## 2020-11-17 DIAGNOSIS — I10 Essential (primary) hypertension: Secondary | ICD-10-CM | POA: Diagnosis not present

## 2020-11-17 LAB — COMPREHENSIVE METABOLIC PANEL
ALT: 14 U/L (ref 0–35)
AST: 11 U/L (ref 0–37)
Albumin: 4.8 g/dL (ref 3.5–5.2)
Alkaline Phosphatase: 92 U/L (ref 39–117)
BUN: 16 mg/dL (ref 6–23)
CO2: 31 mEq/L (ref 19–32)
Calcium: 9.9 mg/dL (ref 8.4–10.5)
Chloride: 99 mEq/L (ref 96–112)
Creatinine, Ser: 0.87 mg/dL (ref 0.40–1.20)
GFR: 83.83 mL/min (ref >60.00)
Glucose, Bld: 129 mg/dL — ABNORMAL HIGH (ref 70–99)
Potassium: 4 mEq/L (ref 3.5–5.1)
Sodium: 138 mEq/L (ref 135–145)
Total Bilirubin: 0.5 mg/dL (ref 0.2–1.2)
Total Protein: 7.1 g/dL (ref 6.0–8.3)

## 2020-11-17 LAB — LIPID PANEL
Cholesterol: 209 mg/dL — ABNORMAL HIGH (ref 0–200)
HDL: 34.3 mg/dL — ABNORMAL LOW (ref >39.00)
LDL Cholesterol: 149 mg/dL — ABNORMAL HIGH (ref 0–99)
NonHDL: 175.02
Total CHOL/HDL Ratio: 6
Triglycerides: 131 mg/dL (ref 0.0–149.0)
VLDL: 26.2 mg/dL (ref 0.0–40.0)

## 2020-11-17 LAB — CBC WITH DIFFERENTIAL/PLATELET
Basophils Absolute: 0.1 10*3/uL (ref 0.0–0.1)
Basophils Relative: 0.9 % (ref 0.0–3.0)
Eosinophils Absolute: 0.3 10*3/uL (ref 0.0–0.7)
Eosinophils Relative: 3.3 % (ref 0.0–5.0)
HCT: 48.3 % — ABNORMAL HIGH (ref 36.0–46.0)
Hemoglobin: 16.2 g/dL — ABNORMAL HIGH (ref 12.0–15.0)
Lymphocytes Relative: 40.9 % (ref 12.0–46.0)
Lymphs Abs: 4 10*3/uL (ref 0.7–4.0)
MCHC: 33.6 g/dL (ref 30.0–36.0)
MCV: 87.4 fl (ref 78.0–100.0)
Monocytes Absolute: 0.6 10*3/uL (ref 0.1–1.0)
Monocytes Relative: 6.6 % (ref 3.0–12.0)
Neutro Abs: 4.7 10*3/uL (ref 1.4–7.7)
Neutrophils Relative %: 48.3 % (ref 43.0–77.0)
Platelets: 239 10*3/uL (ref 150.0–400.0)
RBC: 5.53 Mil/uL — ABNORMAL HIGH (ref 3.87–5.11)
RDW: 14.2 % (ref 11.5–15.5)
WBC: 9.8 10*3/uL (ref 4.0–10.5)

## 2020-11-17 LAB — HEMOGLOBIN A1C: Hgb A1c MFr Bld: 6.2 % (ref 4.6–6.5)

## 2020-11-17 LAB — VITAMIN D 25 HYDROXY (VIT D DEFICIENCY, FRACTURES): VITD: 18.78 ng/mL — ABNORMAL LOW (ref 30.00–100.00)

## 2020-11-17 LAB — VITAMIN B12: Vitamin B-12: 275 pg/mL (ref 211–911)

## 2020-11-18 ENCOUNTER — Other Ambulatory Visit: Payer: Self-pay | Admitting: Physician Assistant

## 2020-11-18 MED ORDER — CYANOCOBALAMIN 1000 MCG/ML IJ SOLN: INTRAMUSCULAR | 1 refills | Status: DC

## 2020-11-18 MED ORDER — VITAMIN D (ERGOCALCIFEROL) 1.25 MG (50000 UNIT) PO CAPS: 50000.0000 [IU] | ORAL_CAPSULE | ORAL | 0 refills | Status: DC

## 2020-12-23 ENCOUNTER — Encounter: Payer: Self-pay | Admitting: Physician Assistant

## 2020-12-23 ENCOUNTER — Ambulatory Visit (INDEPENDENT_AMBULATORY_CARE_PROVIDER_SITE_OTHER): Admitting: Physician Assistant

## 2020-12-23 VITALS — BP 119/78 | HR 92 | Temp 98.7°F | Ht 62.0 in | Wt 170.0 lb

## 2020-12-23 DIAGNOSIS — I10 Essential (primary) hypertension: Secondary | ICD-10-CM

## 2020-12-23 NOTE — Progress Notes (Signed)
Haley Mcdonald is a 40 y.o. female here for a follow up of a pre-existing problem.  History of Present Illness:   Chief Complaint  Patient presents with  . Hypertension    HPI    HTN Currently taking no medications. At home blood pressure readings are: <120/80. Patient denies chest pain, SOB, blurred vision, dizziness, unusual headaches, lower leg swelling. Patient is compliant with medication. Denies excessive alcohol intake, or increase in salt consumption. Does drink multiple Dr Samson Frederic daily and continues to smoke.  BP Readings from Last 3 Encounters:  12/23/20 119/78  11/16/20 98/70  04/27/20 118/78   We stopped her medication, Losartan 50- HCTZ 12.5 mg at her last visit due low readings and HA. She is feeling much improved.  Past Medical History:  Diagnosis Date  . ADD (attention deficit disorder) without hyperactivity   . Anxiety   . Asthma    as a child  . Cervical dysplasia   . Family history of breast cancer   . Family history of lymphoma   . Family history of melanoma   . Headache   . Migraine with aura   . PONV (postoperative nausea and vomiting)   . Pre-diabetes    gestational diabetes also  . Pure hypercholesterolemia 12/20/2018     Social History   Tobacco Use  . Smoking status: Current Every Day Smoker    Packs/day: 0.25    Years: 10.00    Pack years: 2.50    Types: Cigarettes  . Smokeless tobacco: Never Used  Vaping Use  . Vaping Use: Never used  Substance Use Topics  . Alcohol use: Not Currently  . Drug use: No    Past Surgical History:  Procedure Laterality Date  . CERVICAL BIOPSY  W/ LOOP ELECTRODE EXCISION    . CESAREAN SECTION     x 1  . CYSTOSCOPY N/A 06/02/2018   Procedure: CYSTOSCOPY;  Surgeon: Salvadore Dom, MD;  [facility];  Service: Gynecology;  Laterality: N/A;  . DILITATION & CURRETTAGE/HYSTROSCOPY WITH NOVASURE ABLATION N/A 04/01/2015   Procedure: DILATATION & CURETTAGE/HYSTEROSCOPY  WITH NOVASURE ABLATION;  Surgeon: [name], MD;  Location: [facility];  Service: Gynecology;  Laterality: N/A;  . ENDOMETRIAL ABLATION    . TONSILLECTOMY    . TOTAL LAPAROSCOPIC HYSTERECTOMY WITH SALPINGECTOMY Bilateral 06/02/2018   Procedure: TOTAL LAPAROSCOPIC HYSTERECTOMY WITH SALPINGECTOMY;  Surgeon: Salvadore Dom, MD;  Location: [facility];  Service: Gynecology;  Laterality: Bilateral;  1.5 hours OR time per Dr Talbert Nan. Will need extended recovery bed.  . TUBAL LIGATION      Family History  Problem Relation Age of Onset  . Breast cancer Mother 84       "3x"  . Lung cancer Father   . Breast cancer Maternal Aunt 28  . Lymphoma Paternal Grandmother 16  . Colon cancer Maternal Grandfather 33  . Melanoma Paternal Grandfather 1  . Melanoma Cousin     Allergies  Allergen Reactions  . Penicillins Anaphylaxis, Shortness Of Breath and Other (See Comments)    Tachycardia  Has patient had a PCN reaction causing immediate rash, facial/tongue/throat swelling, SOB or lightheadedness with hypotension: Yes Has patient had a PCN reaction causing severe rash involving mucus membranes or skin necrosis: No Has patient had a PCN reaction that required hospitalization: No Has patient had a PCN reaction occurring within the last 10 years: Yes If all of the above answers are "NO", then may proceed with Cephalosporin use.   Marland Kitchen  Bupropion Rash    Blister    Current Medications:   Current Outpatient Medications:  .  cyanocobalamin (,VITAMIN B-12,) 1000 MCG/ML injection, 1000 mcg (1 mg) injection once per week for four weeks, followed by 1000 mcg injection once per month., Disp: 15 mL, Rfl: 1 .  diazepam (VALIUM) 5 MG tablet, Take 1 tablet (5 mg total) by mouth every 12 (twelve) hours as needed for anxiety., Disp: 30 tablet, Rfl: 1 .  FLUoxetine (PROZAC) 20 MG capsule, TAKE 1 CAPSULE(20 MG) BY MOUTH EVERY MORNING, Disp: 90 capsule, Rfl: 1 .  lisdexamfetamine (VYVANSE) 70 MG  capsule, Take 1 capsule (70 mg total) by mouth daily., Disp: 90 capsule, Rfl: 0 .  ondansetron (ZOFRAN) 4 MG tablet, Take 1 tablet (4 mg total) by mouth every 8 (eight) hours as needed for nausea or vomiting., Disp: 20 tablet, Rfl: 0 .  SUMAtriptan (IMITREX) 25 MG tablet, Take 1 tablet (25 mg total) by mouth every 2 (two) hours as needed for migraine. May repeat in 2 hours if headache persists or recurs., Disp: 10 tablet, Rfl: 0 .  Vitamin D, Ergocalciferol, (DRISDOL) 1.25 MG (50000 UNIT) CAPS capsule, Take 1 capsule (50,000 Units total) by mouth every 7 (seven) days., Disp: 12 capsule, Rfl: 0   Review of Systems:   ROS Negative unless otherwise specified per HPI.  Vitals:   Vitals:   12/23/20 0754  BP: 119/78  Pulse: 92  Temp: 98.7 F (37.1 C)  SpO2: 96%  Weight: 170 lb (77.1 kg)  Height: 5\' 2"  (1.575 m)     Body mass index is 31.09 kg/m.  Physical Exam:   Physical Exam Vitals and nursing note reviewed.  Constitutional:      General: She is not in acute distress.    Appearance: She is well-developed. She is not ill-appearing or toxic-appearing.  Cardiovascular:     Rate and Rhythm: Normal rate and regular rhythm.     Pulses: Normal pulses.     Heart sounds: Normal heart sounds, S1 normal and S2 normal.     Comments: No LE edema Pulmonary:     Effort: Pulmonary effort is normal.     Breath sounds: Normal breath sounds.  Skin:    General: Skin is warm and dry.  Neurological:     Mental Status: She is alert.     GCS: GCS eye subscore is 4. GCS verbal subscore is 5. GCS motor subscore is 6.  Psychiatric:        Speech: Speech normal.        Behavior: Behavior normal. Behavior is cooperative.       Assessment and Plan:   Haley Mcdonald was seen today for hypertension.  Diagnoses and all orders for this visit:  Essential hypertension   Well controlled without medications. Continue to monitor and if consistently >140/90, she was instructed to call us. Continue to  work on healthy diet and exercise as able.  Haley Coke, PA-C

## 2021-02-02 ENCOUNTER — Ambulatory Visit (INDEPENDENT_AMBULATORY_CARE_PROVIDER_SITE_OTHER): Admitting: Family Medicine

## 2021-02-02 ENCOUNTER — Encounter: Payer: Self-pay | Admitting: Family Medicine

## 2021-02-02 ENCOUNTER — Other Ambulatory Visit: Payer: Self-pay

## 2021-02-02 VITALS — BP 120/90 | HR 83 | Temp 97.6°F | Ht 62.0 in | Wt 174.8 lb

## 2021-02-02 DIAGNOSIS — M5416 Radiculopathy, lumbar region: Secondary | ICD-10-CM

## 2021-02-02 MED ORDER — PREDNISONE 20 MG PO TABS: ORAL_TABLET | ORAL | 0 refills | Status: DC

## 2021-02-02 MED ORDER — CYCLOBENZAPRINE HCL 10 MG PO TABS
5.0000 mg | ORAL_TABLET | Freq: Every evening | ORAL | 1 refills | Status: DC | PRN
Start: 2021-02-02 — End: 2021-05-25

## 2021-02-02 NOTE — Progress Notes (Signed)
Torryn Hudspeth T. Gracyn Santillanes, MD, [place] at [facility] [place], [address]  Phone: [phone]  FAX: [place] - 40 y.o. female  MRN [id number]  Date of Birth: 1981/07/29  Date: 02/02/2021  PCP: Inda Coke, PA  Referral: Inda Coke, [place]  Chief Complaint  Patient presents with   Back Pain    This visit occurred during the SARS-CoV-2 public health emergency.  Safety protocols were in place, including screening questions prior to the visit, additional usage of staff PPE, and extensive cleaning of exam room while observing appropriate contact time as indicated for disinfecting solutions.   Subjective:   Haley Mcdonald is a 40 y.o. very pleasant female patient who presents with the following: Back Pain  ongoing for approximately: 2 days with acute pain and radicular symptoms, but in the past she has had intermittent back pain off and on for 15 years with intermittent exacerbations. The back pain is localized into the lumbar spine area. They also describe left-sided radiculopathy.  No numbness or tingling. No bowel or bladder incontinence. No focal weakness. Prior interventions: No operative history  No specific trauma, but this did happen suddenly and acutely several days ago.  Review of Systems is noted in the HPI, as appropriate  Objective:   Blood pressure 120/90, pulse 83, temperature 97.6 F (36.4 C), temperature source Temporal, height 5\' 2"  (1.575 m), weight 174 lb 12 oz (79.3 kg), last menstrual period 05/07/2018, SpO2 98 %.  GEN: No acute distress; alert,appropriate. PULM: Breathing comfortably in no respiratory distress PSYCH: Normally interactive.   Range of motion at  the waist: Flexion, rotation and lateral bending: Forward flexion is limited to 45 degrees.  Extension is relatively preserved, and lateral bending and rotation are preserved, but all of which cause  some significant pain with movement  No echymosis or edema Rises to examination table with no difficulty Gait: Mildly antalgic  Inspection/Deformity: No abnormality Paraspinus T: Diffuse from L2-S1 bilaterally, quite taut  B Ankle Dorsiflexion (L5,4): 5/5 B Great Toe Dorsiflexion (L5,4): 5/5 Heel Walk (L5): WNL Toe Walk (S1): WNL Rise/Squat (L4): WNL, mild pain  SENSORY B Medial Foot (L4): WNL B Dorsum (L5): WNL B Lateral (S1): WNL Light Touch: WNL Pinprick: WNL  REFLEXES Knee (L4): 2+ Ankle (S1): 2+  B SLR, seated: Pain, without induction of radiculopathy  B SLR, supine: neg B FABER: neg B Greater Troch: NT B Log Roll: neg B Sciatic Notch: NT  Radiology: No results found.  Assessment and Plan:     ICD-10-CM   1. Lumbar radiculopathy, acute  M54.16      Acute on chronic back pain with exacerbation and intermittent radiculopathy now with exacerbation.  Pulse with steroids, Flexeril at night, range of motion as tolerated.  Try to move normally.  Follow-up.  Meds ordered this encounter  Medications   predniSONE (DELTASONE) 20 MG tablet    Sig: 2 tabs po for 7 days, then 1 tab po for 7 days    Dispense:  21 tablet    Refill:  0   cyclobenzaprine (FLEXERIL) 10 MG tablet    Sig: Take 0.5-1 tablets (5-10 mg total) by mouth at bedtime as needed for muscle spasms.    Dispense:  30 tablet    Refill:  1   There are no discontinued medications. No orders of the defined types were placed in this encounter.   Signed,  Haley Deed. Makynna Manocchio, MD  Outpatient Encounter Medications as of 02/02/2021  Medication Sig   cyanocobalamin (,VITAMIN B-12,) 1000 MCG/ML injection 1000 mcg (1 mg) injection once per week for four weeks, followed by 1000 mcg injection once per month.   cyclobenzaprine (FLEXERIL) 10 MG tablet Take 0.5-1 tablets (5-10 mg total) by mouth at bedtime as needed for muscle spasms.   diazepam (VALIUM) 5 MG tablet Take 1 tablet (5 mg total) by mouth  every 12 (twelve) hours as needed for anxiety.   FLUoxetine (PROZAC) 20 MG capsule TAKE 1 CAPSULE(20 MG) BY MOUTH EVERY MORNING   lisdexamfetamine (VYVANSE) 70 MG capsule Take 1 capsule (70 mg total) by mouth daily.   ondansetron (ZOFRAN) 4 MG tablet Take 1 tablet (4 mg total) by mouth every 8 (eight) hours as needed for nausea or vomiting.   predniSONE (DELTASONE) 20 MG tablet 2 tabs po for 7 days, then 1 tab po for 7 days   SUMAtriptan (IMITREX) 25 MG tablet Take 1 tablet (25 mg total) by mouth every 2 (two) hours as needed for migraine. May repeat in 2 hours if headache persists or recurs.   Vitamin D, Ergocalciferol, (DRISDOL) 1.25 MG (50000 UNIT) CAPS capsule Take 1 capsule (50,000 Units total) by mouth every 7 (seven) days.   No facility-administered encounter medications on file as of 02/02/2021.

## 2021-02-03 ENCOUNTER — Other Ambulatory Visit: Payer: Self-pay | Admitting: Physician Assistant

## 2021-02-12 ENCOUNTER — Other Ambulatory Visit: Payer: Self-pay | Admitting: Physician Assistant

## 2021-03-20 ENCOUNTER — Telehealth: Payer: Self-pay

## 2021-03-20 MED ORDER — LISDEXAMFETAMINE DIMESYLATE 70 MG PO CAPS: 70.0000 mg | ORAL_CAPSULE | Freq: Every day | ORAL | 0 refills | Status: DC

## 2021-03-20 NOTE — Telephone Encounter (Signed)
Pt requesting refill for Vyvanse 70 mg. Last OV 12/23/2020.

## 2021-03-20 NOTE — Telephone Encounter (Signed)
.   Encourage patient to contact the pharmacy for refills or they can request refills through [facility]:  Please schedule appointment if longer than 1 year  NEXT APPOINTMENT DATE:  MEDICATION:lisdexamfetamine (VYVANSE) 70 MG capsule  Is the patient out of medication? Almost   [facility] [id number] - [place], [place]

## 2021-04-19 ENCOUNTER — Other Ambulatory Visit: Payer: Self-pay | Admitting: Physician Assistant

## 2021-04-19 MED ORDER — ALBUTEROL SULFATE HFA 108 (90 BASE) MCG/ACT IN AERS: 2.0000 | INHALATION_SPRAY | Freq: Four times a day (QID) | RESPIRATORY_TRACT | 3 refills | Status: DC | PRN

## 2021-05-11 ENCOUNTER — Other Ambulatory Visit: Payer: Self-pay | Admitting: Obstetrics and Gynecology

## 2021-05-11 ENCOUNTER — Other Ambulatory Visit: Payer: Self-pay | Admitting: Physician Assistant

## 2021-05-11 DIAGNOSIS — Z1231 Encounter for screening mammogram for malignant neoplasm of breast: Secondary | ICD-10-CM

## 2021-05-25 ENCOUNTER — Encounter: Payer: Self-pay | Admitting: Physician Assistant

## 2021-05-25 ENCOUNTER — Ambulatory Visit (INDEPENDENT_AMBULATORY_CARE_PROVIDER_SITE_OTHER): Admitting: Physician Assistant

## 2021-05-25 VITALS — BP 110/80 | HR 97 | Temp 97.6°F | Ht 62.0 in | Wt 178.0 lb

## 2021-05-25 DIAGNOSIS — G43009 Migraine without aura, not intractable, without status migrainosus: Secondary | ICD-10-CM

## 2021-05-25 MED ORDER — ONDANSETRON HCL 4 MG PO TABS: 4.0000 mg | ORAL_TABLET | Freq: Three times a day (TID) | ORAL | 0 refills | Status: DC | PRN

## 2021-05-25 MED ORDER — FLUOXETINE HCL 20 MG PO CAPS: ORAL_CAPSULE | ORAL | 1 refills | Status: DC

## 2021-05-25 MED ORDER — RIZATRIPTAN BENZOATE 10 MG PO TABS: 10.0000 mg | ORAL_TABLET | ORAL | 0 refills | Status: DC | PRN

## 2021-05-25 MED ORDER — KETOROLAC TROMETHAMINE 60 MG/2ML IM SOLN
60.0000 mg | Freq: Once | INTRAMUSCULAR | Status: AC
  Administered 2021-05-25: 60 mg via INTRAMUSCULAR

## 2021-05-25 NOTE — Progress Notes (Signed)
Haley Mcdonald is a 40 y.o. female here for a new problem.  History of Present Illness:   Chief Complaint  Patient presents with   Migraine    HPI  Migraine with Haley Mcdonald presents with c/o a constant migraine for two days. She is currently compliant with imitrex 25 mg but it is currently ineffective. Triggers include stress. She is under significant stress with her current job and new commute. She has had at least two significant migraines in the past month requiring her to miss work. She has had nausea, vomiting, light sensitivity. Denies: chest pain, blurred vision, weakness, confusion. No significant changes to caffeine or lifestyle outside of work.  Past Medical History:  Diagnosis Date   ADD (attention deficit disorder) without hyperactivity    Anxiety    Asthma    as a child   Cervical dysplasia    Family history of breast cancer    Family history of lymphoma    Family history of melanoma    Headache    Migraine with aura    PONV (postoperative nausea and vomiting)    Pre-diabetes    gestational diabetes also   Pure hypercholesterolemia 12/20/2018     Social History   Tobacco Use   Smoking status: Every Day    Packs/day: 0.25    Years: 10.00    Pack years: 2.50    Types: Cigarettes   Smokeless tobacco: Never  Vaping Use   Vaping Use: Never used  Substance Use Topics   Alcohol use: Not Currently   Drug use: No    Past Surgical History:  Procedure Laterality Date   CERVICAL BIOPSY  W/ LOOP ELECTRODE EXCISION     CESAREAN SECTION     x 1   CYSTOSCOPY N/A 06/02/2018   Procedure: CYSTOSCOPY;  Surgeon: Salvadore Dom, MD;  Location: [facility];  Service: Gynecology;  Laterality: N/A;   DILITATION & CURRETTAGE/HYSTROSCOPY WITH NOVASURE ABLATION N/A 04/01/2015   Procedure: DILATATION & CURETTAGE/HYSTEROSCOPY WITH NOVASURE ABLATION;  Surgeon: [name], MD;  Location: [facility];  Service: Gynecology;  Laterality: N/A;    ENDOMETRIAL ABLATION     TONSILLECTOMY     TOTAL LAPAROSCOPIC HYSTERECTOMY WITH SALPINGECTOMY Bilateral 06/02/2018   Procedure: TOTAL LAPAROSCOPIC HYSTERECTOMY WITH SALPINGECTOMY;  Surgeon: Salvadore Dom, MD;  Location: [facility];  Service: Gynecology;  Laterality: Bilateral;  1.5 hours OR time per Dr Talbert Nan. Will need extended recovery bed.   TUBAL LIGATION      Family History  Problem Relation Age of Onset   Breast cancer Mother 36       "3x"   Lung cancer Father    Breast cancer Maternal Aunt 35   Lymphoma Paternal Grandmother 35   Colon cancer Maternal Grandfather 79   Melanoma Paternal Grandfather 41   Melanoma Cousin     Allergies  Allergen Reactions   Penicillins Anaphylaxis, Shortness Of Breath and Other (See Comments)    Tachycardia  Has patient had a PCN reaction causing immediate rash, facial/tongue/throat swelling, SOB or lightheadedness with hypotension: Yes Has patient had a PCN reaction causing severe rash involving mucus membranes or skin necrosis: No Has patient had a PCN reaction that required hospitalization: No Has patient had a PCN reaction occurring within the last 10 years: Yes If all of the above answers are "NO", then may proceed with Cephalosporin use.    Bupropion Rash    Blister    Current Medications:   Current Outpatient  Medications:    albuterol (VENTOLIN HFA) 108 (90 Base) MCG/ACT inhaler, Inhale 2 puffs into the lungs every 6 (six) hours as needed for wheezing or shortness of breath., Disp: 8 g, Rfl: 3   diazepam (VALIUM) 5 MG tablet, Take 1 tablet (5 mg total) by mouth every 12 (twelve) hours as needed for anxiety. (Patient not taking: Reported on 05/25/2021), Disp: 30 tablet, Rfl: 1   FLUoxetine (PROZAC) 20 MG capsule, TAKE 1 CAPSULE(20 MG) BY MOUTH EVERY MORNING, Disp: 90 capsule, Rfl: 1   lisdexamfetamine (VYVANSE) 70 MG capsule, Take 1 capsule (70 mg total) by mouth daily., Disp: 90 capsule, Rfl: 0   ondansetron  (ZOFRAN) 4 MG tablet, Take 1 tablet (4 mg total) by mouth every 8 (eight) hours as needed for nausea or vomiting., Disp: 20 tablet, Rfl: 0   SUMAtriptan (IMITREX) 25 MG tablet, Take 1 tablet (25 mg total) by mouth every 2 (two) hours as needed for migraine. May repeat in 2 hours if headache persists or recurs., Disp: 10 tablet, Rfl: 0   Review of Systems:   ROS Negative unless otherwise specified per HPI.  Vitals:   Vitals:   05/25/21 1139  BP: 110/80  Pulse: 97  Temp: 97.6 F (36.4 C)  TempSrc: Temporal  SpO2: 97%  Weight: 178 lb (80.7 kg)  Height: 5\' 2"  (1.575 m)     Body mass index is 32.56 kg/m.  Physical Exam:   Physical Exam Constitutional:      Appearance: Normal appearance. She is well-developed.  HENT:     Head: Normocephalic and atraumatic.  Eyes:     General: Lids are normal.     Extraocular Movements: Extraocular movements intact.     Conjunctiva/sclera: Conjunctivae normal.  Pulmonary:     Effort: Pulmonary effort is normal.  Musculoskeletal:        General: Normal range of motion.     Cervical back: Normal range of motion and neck supple.  Skin:    General: Skin is warm and dry.  Neurological:     General: No focal deficit present.     Mental Status: She is alert and oriented to person, place, and time.     Cranial Nerves: Cranial nerves are intact.     Sensory: Sensation is intact.     Motor: Motor function is intact.     Coordination: Coordination is intact.  Psychiatric:        Attention and Perception: Attention and perception normal.        Mood and Affect: Mood normal.        Behavior: Behavior normal.        Thought Content: Thought content normal.        Judgment: Judgment normal.    Assessment and Plan:   Migraine without aura and without status migrainosus, not intractable - Plan: ketorolac (TORADOL) injection 60 mg  Uncontrolled today No red flags on neuro exam or hx Refill zofran for prn use Change imtrex to maxalt which she  has used previously but increase to 10 mg If abortive ineffective, will trial nurtec, I have asked to let me know if maxalt is not effective Toradol injection today FMLA process started -- will allow 2 days per month Follow-up with me in 3 months, sooner if concerns  I,Havlyn C Ratchford,acting as a scribe for [employer], PA.,have documented all relevant documentation on the behalf of Inda Coke, PA,as directed by  Inda Coke, PA while in the presence of Inda Coke, [place].  Faythe Dingwall, PA, have reviewed all documentation for this visit. The documentation on 05/25/21 for the exam, diagnosis, procedures, and orders are all accurate and complete.   Inda Coke, PA-C

## 2021-05-26 ENCOUNTER — Telehealth: Payer: Self-pay | Admitting: *Deleted

## 2021-05-26 NOTE — Telephone Encounter (Signed)
Received FMLA paperwork for pt, put in Samantha's folder to complete.

## 2021-05-29 ENCOUNTER — Other Ambulatory Visit: Payer: Self-pay

## 2021-05-29 ENCOUNTER — Ambulatory Visit

## 2021-05-29 ENCOUNTER — Encounter: Payer: Self-pay | Admitting: Physician Assistant

## 2021-05-29 ENCOUNTER — Other Ambulatory Visit (INDEPENDENT_AMBULATORY_CARE_PROVIDER_SITE_OTHER)

## 2021-05-29 ENCOUNTER — Telehealth (INDEPENDENT_AMBULATORY_CARE_PROVIDER_SITE_OTHER): Admitting: Physician Assistant

## 2021-05-29 ENCOUNTER — Other Ambulatory Visit: Payer: Self-pay | Admitting: Physician Assistant

## 2021-05-29 DIAGNOSIS — E8881 Metabolic syndrome: Secondary | ICD-10-CM

## 2021-05-29 DIAGNOSIS — G43009 Migraine without aura, not intractable, without status migrainosus: Secondary | ICD-10-CM

## 2021-05-29 DIAGNOSIS — F32A Depression, unspecified: Secondary | ICD-10-CM

## 2021-05-29 DIAGNOSIS — F988 Other specified behavioral and emotional disorders with onset usually occurring in childhood and adolescence: Secondary | ICD-10-CM

## 2021-05-29 DIAGNOSIS — E559 Vitamin D deficiency, unspecified: Secondary | ICD-10-CM

## 2021-05-29 DIAGNOSIS — F419 Anxiety disorder, unspecified: Secondary | ICD-10-CM | POA: Diagnosis not present

## 2021-05-29 DIAGNOSIS — E538 Deficiency of other specified B group vitamins: Secondary | ICD-10-CM

## 2021-05-29 LAB — VITAMIN B12: Vitamin B-12: 279 pg/mL (ref 211–911)

## 2021-05-29 LAB — VITAMIN D 25 HYDROXY (VIT D DEFICIENCY, FRACTURES): VITD: 18.21 ng/mL — ABNORMAL LOW (ref 30.00–100.00)

## 2021-05-29 LAB — HEMOGLOBIN A1C: Hgb A1c MFr Bld: 6.4 % (ref 4.6–6.5)

## 2021-05-29 MED ORDER — VITAMIN D (ERGOCALCIFEROL) 1.25 MG (50000 UNIT) PO CAPS: 50000.0000 [IU] | ORAL_CAPSULE | ORAL | 0 refills | Status: DC

## 2021-05-29 MED ORDER — AMPHETAMINE-DEXTROAMPHETAMINE 5 MG PO TABS: 5.0000 mg | ORAL_TABLET | Freq: Every day | ORAL | 0 refills | Status: DC

## 2021-05-29 MED ORDER — CYANOCOBALAMIN 1000 MCG/ML IJ SOLN: 1000.0000 ug | INTRAMUSCULAR | 0 refills | Status: DC

## 2021-05-29 NOTE — Telephone Encounter (Signed)
FMLA paperwork completed and faxed to [employer]. Pt aware.

## 2021-05-29 NOTE — Progress Notes (Signed)
Virtual Visit via Video   I connected with Haley Mcdonald on 05/29/21 at  8:30 AM EDT by a video enabled telemedicine application and verified that I am speaking with the correct person using two identifiers. Location patient: Home Location provider: [facility], Office Persons participating in the virtual visit: Ayleen Aidyn, Sportsman PA-C  I discussed the limitations of evaluation and management by telemedicine and the availability of in person appointments. The patient expressed understanding and agreed to proceed.  Subjective:   HPI:   ADHD Currently on Vyvanse 70 mg daily.  She is tolerating this well.  She does report that sometimes when she has to work early in the morning that she feels like her medication is wearing off pretty soon.  She has been on an additional Adderall 5 mg in the afternoon and is wondering if she should resume this.  Anxiety and depression She is currently on Prozac 20 mg daily.  She is tolerating this well without any adverse side effects.  Denies suicidal or homicidal ideation.  Insulin resistance She is working on trying to do better with eating and drinking less sugary beverages.  She is interested in possibly trying mounjaro soon.  Migraines She was last seen by me for this last week.  She was given Toradol and did well with this.  We did send in Maxalt 10 mg for her, she did not take this because the Toradol helped her symptoms.  She has not had another migraine since last visit.  B12 deficiency Currently not on supplementation but would like this checked today.  Denies any paresthesias.  Vitamin D deficiency Does not take supplement regularly, would like this checked today.  ROS: See pertinent positives and negatives per HPI.  Patient Active Problem List   Diagnosis Date Noted   Essential hypertension 09/23/2019   B12 deficiency 09/23/2019   Migraine without aura and without status migrainosus, not intractable  09/23/2019   Anxiety and depression 07/08/2019   At high risk for breast cancer 07/06/2019   Breast cancer screening, high risk patient 07/06/2019   Elevated hemoglobin (HCC) 12/20/2018   Vitamin D deficiency 12/20/2018   Mixed hyperlipidemia 12/20/2018   Tobacco dependence 06/12/2018   Status post laparoscopic hysterectomy 06/02/2018   Insulin resistance    Genetic testing 03/21/2018   Family history of breast cancer    Family history of melanoma    Family history of lymphoma    Cervical dysplasia    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    Social History   Tobacco Use   Smoking status: Every Day    Packs/day: 0.25    Years: 10.00    Pack years: 2.50    Types: Cigarettes   Smokeless tobacco: Never  Substance Use Topics   Alcohol use: Not Currently    Current Outpatient Medications:    amphetamine-dextroamphetamine (ADDERALL) 5 MG tablet, Take 1 tablet (5 mg total) by mouth daily., Disp: 30 tablet, Rfl: 0   albuterol (VENTOLIN HFA) 108 (90 Base) MCG/ACT inhaler, Inhale 2 puffs into the lungs every 6 (six) hours as needed for wheezing or shortness of breath., Disp: 8 g, Rfl: 3   diazepam (VALIUM) 5 MG tablet, Take 1 tablet (5 mg total) by mouth every 12 (twelve) hours as needed for anxiety. (Patient not taking: Reported on 05/25/2021), Disp: 30 tablet, Rfl: 1   FLUoxetine (PROZAC) 20 MG capsule, TAKE 1 CAPSULE(20 MG) BY MOUTH EVERY MORNING, Disp: 90 capsule, Rfl: 1  lisdexamfetamine (VYVANSE) 70 MG capsule, Take 1 capsule (70 mg total) by mouth daily., Disp: 90 capsule, Rfl: 0   ondansetron (ZOFRAN) 4 MG tablet, Take 1 tablet (4 mg total) by mouth every 8 (eight) hours as needed for nausea or vomiting., Disp: 20 tablet, Rfl: 0   rizatriptan (MAXALT) 10 MG tablet, Take 1 tablet (10 mg total) by mouth as needed for migraine. May repeat in 2 hours if needed, Disp: 10 tablet, Rfl: 0  Allergies  Allergen Reactions   Penicillins Anaphylaxis, Shortness Of  Breath and Other (See Comments)    Tachycardia  Has patient had a PCN reaction causing immediate rash, facial/tongue/throat swelling, SOB or lightheadedness with hypotension: Yes Has patient had a PCN reaction causing severe rash involving mucus membranes or skin necrosis: No Has patient had a PCN reaction that required hospitalization: No Has patient had a PCN reaction occurring within the last 10 years: Yes If all of the above answers are "NO", then may proceed with Cephalosporin use.    Bupropion Rash    Blister    Objective:   VITALS: Per patient if applicable, see vitals. GENERAL: Alert, appears well and in no acute distress. HEENT: Atraumatic, conjunctiva clear, no obvious abnormalities on inspection of external nose and ears. NECK: Normal movements of the head and neck. CARDIOPULMONARY: No increased WOB. Speaking in clear sentences. I:E ratio WNL.  MS: Moves all visible extremities without noticeable abnormality. PSYCH: Pleasant and cooperative, well-groomed. Speech normal rate and rhythm. Affect is appropriate. Insight and judgement are appropriate. Attention is focused, linear, and appropriate.  NEURO: CN grossly intact. Oriented as arrived to appointment on time with no prompting. Moves both UE equally.  SKIN: No obvious lesions, wounds, erythema, or cyanosis noted on face or hands.  Assessment and Plan:   Haley Mcdonald was seen today for medication refill.  Diagnoses and all orders for this visit:  Insulin resistance Update hemoglobin A1c today and provide recommendations accordingly Likely trial mounjaro Follow-up based on results of A1c and success of medication trial -     Hemoglobin A1c; Future  Vitamin D deficiency Update blood work and provide recommendations accordingly -     VITAMIN D 25 Hydroxy (Vit-D Deficiency, Fractures); Future  B12 deficiency Update blood work and provide recommendations accordingly -     Vitamin B12; Future  Anxiety and  depression Well-controlled Continue Prozac 20 mg daily  ADD (attention deficit disorder) without hyperactivity Uncontrolled Continue Vyvanse 70 mg daily Add 5 mg Adderall immediate release in afternoon  Migraine without aura and without status migrainosus, not intractable Controlled currently She has yet to trial new abortive medication that was sent in, Maxalt 10 mg as needed She will let us know if this is effective after her next migraine Will consider neurology referral if remains uncontrolled  Other orders -     amphetamine-dextroamphetamine (ADDERALL) 5 MG tablet; Take 1 tablet (5 mg total) by mouth daily.   I discussed the assessment and treatment plan with the patient. The patient was provided an opportunity to ask questions and all were answered. The patient agreed with the plan and demonstrated an understanding of the instructions.   The patient was advised to call back or seek an in-person evaluation if the symptoms worsen or if the condition fails to improve as anticipated.   Inda Coke, [place] 05/29/2021

## 2021-05-31 ENCOUNTER — Other Ambulatory Visit: Payer: Self-pay

## 2021-05-31 ENCOUNTER — Ambulatory Visit (INDEPENDENT_AMBULATORY_CARE_PROVIDER_SITE_OTHER): Admitting: *Deleted

## 2021-05-31 DIAGNOSIS — Z23 Encounter for immunization: Secondary | ICD-10-CM | POA: Diagnosis not present

## 2021-07-10 ENCOUNTER — Other Ambulatory Visit: Payer: Self-pay | Admitting: Physician Assistant

## 2021-07-10 MED ORDER — TIRZEPATIDE 2.5 MG/0.5ML ~~LOC~~ SOAJ: 2.5000 mg | SUBCUTANEOUS | 0 refills | Status: DC

## 2021-07-17 ENCOUNTER — Other Ambulatory Visit: Payer: Self-pay | Admitting: Physician Assistant

## 2021-07-17 ENCOUNTER — Other Ambulatory Visit: Payer: Self-pay

## 2021-07-17 MED ORDER — LISDEXAMFETAMINE DIMESYLATE 70 MG PO CAPS: 70.0000 mg | ORAL_CAPSULE | Freq: Every day | ORAL | 0 refills | Status: DC

## 2021-07-17 NOTE — Telephone Encounter (Signed)
Pt requesting refill on Vyvanse.

## 2021-07-17 NOTE — Telephone Encounter (Signed)
MEDICATION: lisdexamfetamine (VYVANSE) 70 MG capsule  PHARMACY:  [id number] [address], [address] AT [place] Phone:  [phone]  Fax:  [phone]      Comments:   **Let patient know to contact pharmacy at the end of the day to make sure medication is ready. **  ** Please notify patient to allow 48-72 hours to process**  **Encourage patient to contact the pharmacy for refills or they can request refills through [facility]**

## 2021-07-31 ENCOUNTER — Encounter: Payer: Self-pay | Admitting: Family Medicine

## 2021-07-31 ENCOUNTER — Ambulatory Visit (INDEPENDENT_AMBULATORY_CARE_PROVIDER_SITE_OTHER)
Admission: RE | Admit: 2021-07-31 | Discharge: 2021-07-31 | Disposition: A | Discharge status: Home / Self Care | Source: Ambulatory Visit | Attending: Family Medicine | Admitting: Family Medicine

## 2021-07-31 ENCOUNTER — Other Ambulatory Visit: Payer: Self-pay

## 2021-07-31 ENCOUNTER — Ambulatory Visit (INDEPENDENT_AMBULATORY_CARE_PROVIDER_SITE_OTHER): Admitting: Family Medicine

## 2021-07-31 VITALS — BP 122/70 | HR 81 | Temp 97.3°F | Ht 62.0 in | Wt 174.0 lb

## 2021-07-31 DIAGNOSIS — M25532 Pain in left wrist: Secondary | ICD-10-CM | POA: Diagnosis not present

## 2021-07-31 NOTE — Progress Notes (Signed)
Haley Mcdonald T. Haley Foley, MD, [place] at [facility] [place], [address]  Phone: [phone]  FAX: [phone]  Haley Mcdonald - 40 y.o. female  MRN [id number]  Date of Birth: [birth year]  Date: 07/31/2021  PCP: Inda Coke, PA  Referral: Inda Coke, [place]  Chief Complaint  Patient presents with   Wrist Injury    Left-Dropped baking sheet on it over the weekend    This visit occurred during the SARS-CoV-2 public health emergency.  Safety protocols were in place, including screening questions prior to the visit, additional usage of staff PPE, and extensive cleaning of exam room while observing appropriate contact time as indicated for disinfecting solutions.   Subjective:   Haley Mcdonald is a 41 y.o. very pleasant female patient with Body mass index is 31.83 kg/m. who presents with the following:  Cleaning aggressively, stuff all over the kitchen.  A baking sheet fell and landed on the dorsum of her wrist.  She is now having pain in the dorsum of the carpal region, she is having some pain with extension at the wrist.  There are some mild swelling.  No bruising.  No significant prior history.  She is able to make a full composite fist and she is not having any pain in the distal radius or ulna.  Drying rash fell on her left wrist.   Review of Systems is noted in the HPI, as appropriate  Objective:   BP 122/70   Pulse 81   Temp (!) 97.3 F (36.3 C) (Temporal)   Ht 5\' 2"  (1.575 m)   Wt 174 lb (78.9 kg)   LMP 05/07/2018 (Exact Date)   SpO2 97%   BMI 31.83 kg/m   GEN: No acute distress; alert,appropriate. PULM: Breathing comfortably in no respiratory distress PSYCH: Normally interactive.   Fingers all move appropriately ROM are nontender as well as all metacarpals.  Nontender at the distal radius and ulna.  Full composite fist.  There is some mild pain with loading the wrist.  Mild pain  with ulnar and radial deviation.  She does have pain with direct pressure on the carpal bones.  Full range of motion at the elbow and flexion and extension are intact as well as grip.  Laboratory and Imaging Data:  Assessment and Plan:     ICD-10-CM   1. Acute pain of left wrist  M25.532 DG Wrist Complete Left     I do not see a fracture on plain film.  I will follow-up and change plan of care if radiology sees an additional finding.  Placed the patient into cock up forearm splint.  Likely wear between 2 or 3 weeks with resolution.  Bone bruise.  Pathology reviewed.  These usually heal in 3 to 4 weeks.   Orders Placed This Encounter  Procedures   DG Wrist Complete Left    Follow-up: No follow-ups on file.  [employer] One speech-to-text software was used for transcription in this dictation.  Possible transcriptional errors can occur using Editor, commissioning.   Signed,  Maud Deed. Angelize Ryce, MD   Outpatient Encounter Medications as of 07/31/2021  Medication Sig   albuterol (VENTOLIN HFA) 108 (90 Base) MCG/ACT inhaler Inhale 2 puffs into the lungs every 6 (six) hours as needed for wheezing or shortness of breath.   amphetamine-dextroamphetamine (ADDERALL) 5 MG tablet Take 1 tablet (5 mg total) by mouth daily.   cyanocobalamin (,VITAMIN B-12,) 1000 MCG/ML injection Inject  1 mL (1,000 mcg total) into the muscle every 30 (thirty) days.   diazepam (VALIUM) 5 MG tablet Take 1 tablet (5 mg total) by mouth every 12 (twelve) hours as needed for anxiety.   FLUoxetine (PROZAC) 20 MG capsule TAKE 1 CAPSULE(20 MG) BY MOUTH EVERY MORNING   lisdexamfetamine (VYVANSE) 70 MG capsule Take 1 capsule (70 mg total) by mouth daily.   ondansetron (ZOFRAN) 4 MG tablet Take 1 tablet (4 mg total) by mouth every 8 (eight) hours as needed for nausea or vomiting.   rizatriptan (MAXALT) 10 MG tablet Take 1 tablet (10 mg total) by mouth as needed for migraine. May repeat in 2 hours if needed   tirzepatide  [facility]) 2.5 MG/0.5ML Pen Inject 2.5 mg into the skin once a week.   Vitamin D, Ergocalciferol, (DRISDOL) 1.25 MG (50000 UNIT) CAPS capsule Take 1 capsule (50,000 Units total) by mouth every 7 (seven) days.   No facility-administered encounter medications on file as of 07/31/2021.

## 2021-08-01 ENCOUNTER — Encounter: Payer: Self-pay | Admitting: Family Medicine

## 2021-08-21 ENCOUNTER — Other Ambulatory Visit: Payer: Self-pay | Admitting: Physician Assistant

## 2021-11-03 ENCOUNTER — Other Ambulatory Visit: Payer: Self-pay | Admitting: Physician Assistant

## 2021-11-03 MED ORDER — LISDEXAMFETAMINE DIMESYLATE 70 MG PO CAPS: 70.0000 mg | ORAL_CAPSULE | Freq: Every day | ORAL | 0 refills | Status: DC

## 2021-11-03 MED ORDER — FLUOXETINE HCL 20 MG PO CAPS: ORAL_CAPSULE | ORAL | 1 refills | Status: DC

## 2021-11-18 ENCOUNTER — Other Ambulatory Visit: Payer: Self-pay | Admitting: Physician Assistant

## 2021-11-18 MED ORDER — NIRMATRELVIR/RITONAVIR (PAXLOVID)TABLET: 3.0000 | ORAL_TABLET | Freq: Two times a day (BID) | ORAL | 0 refills | Status: AC

## 2022-01-05 ENCOUNTER — Other Ambulatory Visit: Payer: Self-pay | Admitting: *Deleted

## 2022-01-05 MED ORDER — VITAMIN D (ERGOCALCIFEROL) 1.25 MG (50000 UNIT) PO CAPS: 50000.0000 [IU] | ORAL_CAPSULE | ORAL | 0 refills | Status: DC

## 2022-01-23 ENCOUNTER — Other Ambulatory Visit: Payer: Self-pay | Admitting: Physician Assistant

## 2022-01-23 DIAGNOSIS — Z1231 Encounter for screening mammogram for malignant neoplasm of breast: Secondary | ICD-10-CM

## 2022-02-19 ENCOUNTER — Ambulatory Visit
Admission: RE | Admit: 2022-02-19 | Discharge: 2022-02-19 | Disposition: A | Discharge status: Home / Self Care | Source: Ambulatory Visit | Attending: Physician Assistant | Admitting: Physician Assistant

## 2022-02-19 DIAGNOSIS — Z1231 Encounter for screening mammogram for malignant neoplasm of breast: Secondary | ICD-10-CM | POA: Insufficient documentation

## 2022-02-21 ENCOUNTER — Other Ambulatory Visit: Payer: Self-pay | Admitting: Physician Assistant

## 2022-02-21 DIAGNOSIS — R928 Other abnormal and inconclusive findings on diagnostic imaging of breast: Secondary | ICD-10-CM

## 2022-02-21 DIAGNOSIS — N6310 Unspecified lump in the right breast, unspecified quadrant: Secondary | ICD-10-CM

## 2022-02-21 DIAGNOSIS — N63 Unspecified lump in unspecified breast: Secondary | ICD-10-CM

## 2022-02-28 ENCOUNTER — Ambulatory Visit
Admission: RE | Admit: 2022-02-28 | Discharge: 2022-02-28 | Disposition: A | Discharge status: Home / Self Care | Source: Ambulatory Visit | Attending: Physician Assistant | Admitting: Physician Assistant

## 2022-02-28 DIAGNOSIS — N6310 Unspecified lump in the right breast, unspecified quadrant: Secondary | ICD-10-CM

## 2022-03-01 ENCOUNTER — Other Ambulatory Visit: Payer: Self-pay | Admitting: Physician Assistant

## 2022-03-01 DIAGNOSIS — I10 Essential (primary) hypertension: Secondary | ICD-10-CM

## 2022-03-01 DIAGNOSIS — E88819 Insulin resistance, unspecified: Secondary | ICD-10-CM

## 2022-03-01 DIAGNOSIS — E8881 Metabolic syndrome: Secondary | ICD-10-CM

## 2022-03-01 DIAGNOSIS — E559 Vitamin D deficiency, unspecified: Secondary | ICD-10-CM

## 2022-03-01 DIAGNOSIS — E538 Deficiency of other specified B group vitamins: Secondary | ICD-10-CM

## 2022-03-01 DIAGNOSIS — F32A Depression, unspecified: Secondary | ICD-10-CM

## 2022-03-01 MED ORDER — LISDEXAMFETAMINE DIMESYLATE 70 MG PO CAPS
70.0000 mg | ORAL_CAPSULE | Freq: Every day | ORAL | 0 refills | Status: DC
Start: 2022-03-01 — End: 2022-03-20

## 2022-03-09 ENCOUNTER — Other Ambulatory Visit

## 2022-03-09 ENCOUNTER — Encounter

## 2022-03-15 ENCOUNTER — Other Ambulatory Visit (INDEPENDENT_AMBULATORY_CARE_PROVIDER_SITE_OTHER)

## 2022-03-15 DIAGNOSIS — E538 Deficiency of other specified B group vitamins: Secondary | ICD-10-CM

## 2022-03-15 DIAGNOSIS — I10 Essential (primary) hypertension: Secondary | ICD-10-CM

## 2022-03-15 DIAGNOSIS — E559 Vitamin D deficiency, unspecified: Secondary | ICD-10-CM

## 2022-03-15 DIAGNOSIS — E8881 Metabolic syndrome: Secondary | ICD-10-CM

## 2022-03-15 LAB — CBC WITH DIFFERENTIAL/PLATELET
Basophils Absolute: 0 10*3/uL (ref 0.0–0.1)
Basophils Relative: 0.4 % (ref 0.0–3.0)
Eosinophils Absolute: 0.3 10*3/uL (ref 0.0–0.7)
Eosinophils Relative: 3.7 % (ref 0.0–5.0)
HCT: 46.2 % — ABNORMAL HIGH (ref 36.0–46.0)
Hemoglobin: 15.4 g/dL — ABNORMAL HIGH (ref 12.0–15.0)
Lymphocytes Relative: 40 % (ref 12.0–46.0)
Lymphs Abs: 3.6 10*3/uL (ref 0.7–4.0)
MCHC: 33.3 g/dL (ref 30.0–36.0)
MCV: 87.8 fl (ref 78.0–100.0)
Monocytes Absolute: 0.6 10*3/uL (ref 0.1–1.0)
Monocytes Relative: 6.5 % (ref 3.0–12.0)
Neutro Abs: 4.4 10*3/uL (ref 1.4–7.7)
Neutrophils Relative %: 49.4 % (ref 43.0–77.0)
Platelets: 237 10*3/uL (ref 150.0–400.0)
RBC: 5.26 Mil/uL — ABNORMAL HIGH (ref 3.87–5.11)
RDW: 13.6 % (ref 11.5–15.5)
WBC: 9 10*3/uL (ref 4.0–10.5)

## 2022-03-15 LAB — HEMOGLOBIN A1C: Hgb A1c MFr Bld: 6.4 % (ref 4.6–6.5)

## 2022-03-15 LAB — COMPREHENSIVE METABOLIC PANEL
ALT: 14 U/L (ref 0–35)
AST: 12 U/L (ref 0–37)
Albumin: 4.6 g/dL (ref 3.5–5.2)
Alkaline Phosphatase: 102 U/L (ref 39–117)
BUN: 15 mg/dL (ref 6–23)
CO2: 29 mEq/L (ref 19–32)
Calcium: 9.2 mg/dL (ref 8.4–10.5)
Chloride: 102 mEq/L (ref 96–112)
Creatinine, Ser: 0.81 mg/dL (ref 0.40–1.20)
GFR: 90.49 mL/min (ref >60.00)
Glucose, Bld: 115 mg/dL — ABNORMAL HIGH (ref 70–99)
Potassium: 4.1 mEq/L (ref 3.5–5.1)
Sodium: 140 mEq/L (ref 135–145)
Total Bilirubin: 0.4 mg/dL (ref 0.2–1.2)
Total Protein: 6.9 g/dL (ref 6.0–8.3)

## 2022-03-15 LAB — LIPID PANEL
Cholesterol: 182 mg/dL (ref 0–200)
HDL: 29.9 mg/dL — ABNORMAL LOW (ref >39.00)
LDL Cholesterol: 125 mg/dL — ABNORMAL HIGH (ref 0–99)
NonHDL: 152.4
Total CHOL/HDL Ratio: 6
Triglycerides: 136 mg/dL (ref 0.0–149.0)
VLDL: 27.2 mg/dL (ref 0.0–40.0)

## 2022-03-15 LAB — VITAMIN B12: Vitamin B-12: 299 pg/mL (ref 211–911)

## 2022-03-15 LAB — VITAMIN D 25 HYDROXY (VIT D DEFICIENCY, FRACTURES): VITD: 16.38 ng/mL — ABNORMAL LOW (ref 30.00–100.00)

## 2022-03-16 ENCOUNTER — Ambulatory Visit (INDEPENDENT_AMBULATORY_CARE_PROVIDER_SITE_OTHER): Admitting: Physician Assistant

## 2022-03-16 ENCOUNTER — Encounter: Payer: Self-pay | Admitting: Physician Assistant

## 2022-03-16 VITALS — BP 118/80 | HR 82 | Ht 62.0 in | Wt 172.0 lb

## 2022-03-16 DIAGNOSIS — E559 Vitamin D deficiency, unspecified: Secondary | ICD-10-CM | POA: Diagnosis not present

## 2022-03-16 DIAGNOSIS — E8881 Metabolic syndrome: Secondary | ICD-10-CM

## 2022-03-16 DIAGNOSIS — F419 Anxiety disorder, unspecified: Secondary | ICD-10-CM | POA: Diagnosis not present

## 2022-03-16 DIAGNOSIS — E782 Mixed hyperlipidemia: Secondary | ICD-10-CM

## 2022-03-16 DIAGNOSIS — Z0000 Encounter for general adult medical examination without abnormal findings: Secondary | ICD-10-CM

## 2022-03-16 DIAGNOSIS — E88819 Insulin resistance, unspecified: Secondary | ICD-10-CM

## 2022-03-16 DIAGNOSIS — I10 Essential (primary) hypertension: Secondary | ICD-10-CM

## 2022-03-16 DIAGNOSIS — F17200 Nicotine dependence, unspecified, uncomplicated: Secondary | ICD-10-CM

## 2022-03-16 DIAGNOSIS — F32A Depression, unspecified: Secondary | ICD-10-CM

## 2022-03-16 DIAGNOSIS — E669 Obesity, unspecified: Secondary | ICD-10-CM

## 2022-03-16 MED ORDER — VITAMIN D (ERGOCALCIFEROL) 1.25 MG (50000 UNIT) PO CAPS
50000.0000 [IU] | ORAL_CAPSULE | ORAL | 1 refills | Status: DC
Start: 2022-03-16 — End: 2022-07-26

## 2022-03-16 NOTE — Progress Notes (Signed)
Subjective:    Haley Mcdonald is a 41 y.o. female and is here for a comprehensive physical exam.   HPI  Health Maintenance Due  Topic Date Due   Hepatitis C Screening  Never done   COVID-19 Vaccine (3 - Pfizer risk series) 03/02/2020   PAP SMEAR-Modifier  02/27/2021    Acute Concerns: None  Chronic Issues: Anxiety and depression -- currently not taking prozac 20 mg; she would like to restart.  HTN -- currently not on medication and well controlled. Checks BP occasionally and having normal readings.  Vit D deficiency - hx of needing high dose  Insulin resistance - did well with Mounjaro but insurance did not approve.   Health Maintenance: Immunizations -- UTD Colonoscopy -- n/a Mammogram -- UTD PAP -- n/a Diet -- working on healthy diet and exercise as able Sleep habits -- no concerns Exercise -- she is now walking a few days a week Weight --    Mood -- see above Weight history: Wt Readings from Last 10 Encounters:  07/31/21 174 lb (78.9 kg)  05/25/21 178 lb (80.7 kg)  02/02/21 174 lb 12 oz (79.3 kg)  12/23/20 170 lb (77.1 kg)  11/16/20 166 lb 8 oz (75.5 kg)  08/15/20 162 lb (73.5 kg)  04/27/20 168 lb (76.2 kg)  01/27/20 173 lb 9.6 oz (78.7 kg)  11/23/19 170 lb (77.1 kg)  09/30/19 170 lb (77.1 kg)   Body mass index is 31.83 kg/m. Patient's last menstrual period was 05/07/2018 (exact date). Alcohol use:  reports that she does not currently use alcohol. Tobacco use:  Tobacco Use: High Risk (03/16/2022)   Patient History    Smoking Tobacco Use: Every Day    Smokeless Tobacco Use: Never    Passive Exposure: Not on file        07/08/2019    3:29 PM  Depression screen PHQ 2/9  Decreased Interest 2  Down, Depressed, Hopeless 2  PHQ - 2 Score 4  Altered sleeping 1  Tired, decreased energy 1  Change in appetite 0  Feeling bad or failure about yourself  0  Trouble concentrating 0  Moving slowly or fidgety/restless 0  Suicidal thoughts 0   PHQ-9 Score 6  Difficult doing work/chores Somewhat difficult     Other providers/specialists: Patient Care Team: Inda Coke, [place] as PCP - General (Physician Assistant) Salvadore Dom, MD as Consulting Physician (Obstetrics and Gynecology) Magrinat, Virgie Dad, MD (Inactive) as Consulting Physician (Oncology)    PMHx, SurgHx, SocialHx, Medications, and Allergies were reviewed in the Visit Navigator and updated as appropriate.   Past Medical History:  Diagnosis Date   ADD (attention deficit disorder) without hyperactivity    Anxiety    Asthma    as a child   Cervical dysplasia    Family history of breast cancer    Family history of lymphoma    Family history of melanoma    Headache    Migraine with aura    PONV (postoperative nausea and vomiting)    Pre-diabetes    gestational diabetes also   Pure hypercholesterolemia 12/20/2018     Past Surgical History:  Procedure Laterality Date   CERVICAL BIOPSY  W/ LOOP ELECTRODE EXCISION     CESAREAN SECTION     x 1   CYSTOSCOPY N/A 06/02/2018   Procedure: CYSTOSCOPY;  Surgeon: Salvadore Dom, MD;  Location: [facility];  Service: Gynecology;  Laterality: N/A;   DILITATION & CURRETTAGE/HYSTROSCOPY WITH NOVASURE  ABLATION N/A 04/01/2015   Procedure: DILATATION & CURETTAGE/HYSTEROSCOPY WITH NOVASURE ABLATION;  Surgeon: [name], MD;  Location: [facility];  Service: Gynecology;  Laterality: N/A;   ENDOMETRIAL ABLATION     TONSILLECTOMY     TOTAL LAPAROSCOPIC HYSTERECTOMY WITH SALPINGECTOMY Bilateral 06/02/2018   Procedure: TOTAL LAPAROSCOPIC HYSTERECTOMY WITH SALPINGECTOMY;  Surgeon: Salvadore Dom, MD;  Location: [facility];  Service: Gynecology;  Laterality: Bilateral;  1.5 hours OR time per Dr Talbert Nan. Will need extended recovery bed.   TUBAL LIGATION       Family History  Problem Relation Age of Onset   Breast cancer Mother 9       "3x"   Lung cancer Father    Breast  cancer Maternal Aunt 74   Lymphoma Paternal Grandmother 48   Colon cancer Maternal Grandfather 56   Melanoma Paternal Grandfather 67   Melanoma Cousin     Social History   Tobacco Use   Smoking status: Every Day    Packs/day: 0.25    Years: 10.00    Total pack years: 2.50    Types: Cigarettes   Smokeless tobacco: Never  Vaping Use   Vaping Use: Never used  Substance Use Topics   Alcohol use: Not Currently   Drug use: No    Review of Systems:   Review of Systems  Constitutional:  Negative for chills, fever, malaise/fatigue and weight loss.  HENT:  Negative for hearing loss, sinus pain and sore throat.   Respiratory:  Negative for cough and hemoptysis.   Cardiovascular:  Negative for chest pain, palpitations, leg swelling and PND.  Gastrointestinal:  Negative for abdominal pain, constipation, diarrhea, heartburn, nausea and vomiting.  Genitourinary:  Negative for dysuria, frequency and urgency.  Musculoskeletal:  Negative for back pain, myalgias and neck pain.  Skin:  Negative for itching and rash.  Neurological:  Negative for dizziness, tingling, seizures and headaches.  Endo/Heme/Allergies:  Negative for polydipsia.  Psychiatric/Behavioral:  Negative for depression. The patient is not nervous/anxious.     Objective:   BP 118/80   Pulse 82   Ht '5\' 2"'$  (1.575 m)   LMP 05/07/2018 (Exact Date)   SpO2 98%   BMI 31.83 kg/m  Body mass index is 31.83 kg/m.   General Appearance:    Alert, cooperative, no distress, appears stated age  Head:    Normocephalic, without obvious abnormality, atraumatic  Eyes:    PERRL, conjunctiva/corneas clear, EOM's intact, fundi    benign, both eyes  Ears:    Normal TM's and external ear canals, both ears  Nose:   Nares normal, septum midline, mucosa normal, no drainage    or sinus tenderness  Throat:   Lips, mucosa, and tongue normal; teeth and gums normal  Neck:   Supple, symmetrical, trachea midline, no adenopathy;    thyroid:  no  enlargement/tenderness/nodules; no carotid   bruit or JVD  Back:     Symmetric, no curvature, ROM normal, no CVA tenderness  Lungs:     Clear to auscultation bilaterally, respirations unlabored  Chest Wall:    No tenderness or deformity   Heart:    Regular rate and rhythm, S1 and S2 normal, no murmur, rub or gallop  Breast Exam:    Deferred  Abdomen:     Soft, non-tender, bowel sounds active all four quadrants,    no masses, no organomegaly  Genitalia:    Deferred  Extremities:   Extremities normal, atraumatic, no cyanosis or edema  Pulses:  2+ and symmetric all extremities  Skin:   Skin color, texture, turgor normal, no rashes or lesions  Lymph nodes:   Cervical, supraclavicular, and axillary nodes normal  Neurologic:   CNII-XII intact, normal strength, sensation and reflexes    throughout    Assessment/Plan:   Routine physical examination Today patient counseled on age appropriate routine health concerns for screening and prevention, each reviewed and up to date or declined. Immunizations reviewed and up to date or declined. Labs ordered and reviewed. Risk factors for depression reviewed and negative. Hearing function and visual acuity are intact. ADLs screened and addressed as needed. Functional ability and level of safety reviewed and appropriate. Education, counseling and referrals performed based on assessed risks today. Patient provided with a copy of personalized plan for preventive services.  Insulin resistance Stable Consider GLP-1 Continue efforts at healthy eating  Vitamin D deficiency Needs high dose vitamin D, agreeable  Anxiety and depression Uncontrolled Needs to restart prozac 20 mg -- discussed Denies SI/HI  Essential hypertension Normotensive in the office Continue to monitor  Obesity, unspecified classification, unspecified obesity type, unspecified whether serious comorbidity present Continue to work on healthy diet and exercise Consider  GLP-1  Tobacco abuse She is not ready to quit at this time  HLD ASCVD 5.5%  Encouraged smoking reduction   Patient Counseling: '[x]'$    Nutrition: Stressed importance of moderation in sodium/caffeine intake, saturated fat and cholesterol, caloric balance, sufficient intake of fresh fruits, vegetables, fiber, calcium, iron, and 1 mg of folate supplement per day (for females capable of pregnancy).  '[x]'$    Stressed the importance of regular exercise.   '[x]'$    Substance Abuse: Discussed cessation/primary prevention of tobacco, alcohol, or other drug use; driving or other dangerous activities under the influence; availability of treatment for abuse.   '[x]'$    Injury prevention: Discussed safety belts, safety helmets, smoke detector, smoking near bedding or upholstery.   '[x]'$    Sexuality: Discussed sexually transmitted diseases, partner selection, use of condoms, avoidance of unintended pregnancy  and contraceptive alternatives.  '[x]'$    Dental health: Discussed importance of regular tooth brushing, flossing, and dental visits.  '[x]'$    Health maintenance and immunizations reviewed. Please refer to Health maintenance section.   Inda Coke, PA-C [place]

## 2022-03-19 ENCOUNTER — Encounter: Admitting: Physician Assistant

## 2022-03-20 ENCOUNTER — Other Ambulatory Visit: Payer: Self-pay | Admitting: Physician Assistant

## 2022-03-20 MED ORDER — LISDEXAMFETAMINE DIMESYLATE 40 MG PO CAPS: 40.0000 mg | ORAL_CAPSULE | ORAL | 0 refills | Status: DC

## 2022-03-22 ENCOUNTER — Other Ambulatory Visit: Payer: Self-pay | Admitting: Physician Assistant

## 2022-03-22 MED ORDER — VYVANSE 60 MG PO CHEW: 60.0000 mg | CHEWABLE_TABLET | Freq: Every day | ORAL | 0 refills | Status: DC

## 2022-03-28 ENCOUNTER — Encounter (INDEPENDENT_AMBULATORY_CARE_PROVIDER_SITE_OTHER): Payer: Self-pay

## 2022-04-12 ENCOUNTER — Other Ambulatory Visit: Payer: Self-pay | Admitting: *Deleted

## 2022-04-12 MED ORDER — RIZATRIPTAN BENZOATE 10 MG PO TABS: 10.0000 mg | ORAL_TABLET | ORAL | 1 refills | Status: DC | PRN

## 2022-04-30 ENCOUNTER — Other Ambulatory Visit: Payer: Self-pay | Admitting: Physician Assistant

## 2022-04-30 MED ORDER — VYVANSE 60 MG PO CHEW
60.0000 mg | CHEWABLE_TABLET | Freq: Every day | ORAL | 0 refills | Status: DC
Start: 2022-04-30 — End: 2022-06-11

## 2022-05-14 ENCOUNTER — Encounter: Payer: Self-pay | Admitting: *Deleted

## 2022-05-18 ENCOUNTER — Encounter: Payer: Self-pay | Admitting: Family

## 2022-05-18 ENCOUNTER — Ambulatory Visit (INDEPENDENT_AMBULATORY_CARE_PROVIDER_SITE_OTHER): Admitting: Family

## 2022-05-18 VITALS — BP 120/82 | HR 94 | Temp 98.0°F | Resp 16 | Ht 62.0 in | Wt 174.0 lb

## 2022-05-18 DIAGNOSIS — J4521 Mild intermittent asthma with (acute) exacerbation: Secondary | ICD-10-CM | POA: Diagnosis not present

## 2022-05-18 DIAGNOSIS — J45909 Unspecified asthma, uncomplicated: Secondary | ICD-10-CM | POA: Insufficient documentation

## 2022-05-18 DIAGNOSIS — J40 Bronchitis, not specified as acute or chronic: Secondary | ICD-10-CM | POA: Insufficient documentation

## 2022-05-18 DIAGNOSIS — J309 Allergic rhinitis, unspecified: Secondary | ICD-10-CM

## 2022-05-18 MED ORDER — BENZONATATE 200 MG PO CAPS: 200.0000 mg | ORAL_CAPSULE | Freq: Two times a day (BID) | ORAL | 0 refills | Status: DC | PRN

## 2022-05-18 MED ORDER — PREDNISONE 20 MG PO TABS: ORAL_TABLET | ORAL | 0 refills | Status: DC

## 2022-05-18 MED ORDER — AZELASTINE HCL 0.1 % NA SOLN: 1.0000 | Freq: Two times a day (BID) | NASAL | 12 refills | Status: DC

## 2022-05-18 MED ORDER — AZITHROMYCIN 250 MG PO TABS: ORAL_TABLET | ORAL | 0 refills | Status: AC

## 2022-05-18 NOTE — Assessment & Plan Note (Addendum)
rx zpack 250 mg  rx prednisone 20 mg  rx tessalon perrles 200 mg Take antibiotic as prescribed. Increase oral fluids. Pt to f/u if sx worsen and or fail to improve in 2-3 days.

## 2022-05-18 NOTE — Patient Instructions (Signed)
Antibiotic sent to preferred pharmacy.   Please increase oral fluids, steamy hot shower/humidifier prn.  Please follow up if no improvement in 2-3 days.   It was a pleasure seeing you today! Please do not hesitate to reach out with any questions and or concerns.  Regards,   Lynzee Lindquist   

## 2022-05-18 NOTE — Progress Notes (Signed)
Established Patient Office Visit  Subjective:  Patient ID: Haley Mcdonald, female    DOB: 01-Aug-1981  Age: 41 y.o. MRN: [id number]  CC:  Chief Complaint  Patient presents with   Cough    X 2 week 3 X neg covid test    HPI Haley Mcdonald is here today with concerns.   Two weeks ago started with cough and chest congestion.  Had to use inhaler this am because she has had sob.  Sore throat with some pnd. Coughing often, this cough is dry. No ear pain or sinus pressure.  No fever or chills.   Past Medical History:  Diagnosis Date   ADD (attention deficit disorder) without hyperactivity    Anxiety    Asthma    as a child   Cervical dysplasia    Family history of breast cancer    Family history of lymphoma    Family history of melanoma    Headache    Migraine with aura    PONV (postoperative nausea and vomiting)    Pre-diabetes    gestational diabetes also   Pure hypercholesterolemia 12/20/2018    Past Surgical History:  Procedure Laterality Date   CERVICAL BIOPSY  W/ LOOP ELECTRODE EXCISION     CESAREAN SECTION     x 1   CYSTOSCOPY N/A 06/02/2018   Procedure: CYSTOSCOPY;  Surgeon: Salvadore Dom, MD;  Location: [facility];  Service: Gynecology;  Laterality: N/A;   DILITATION & CURRETTAGE/HYSTROSCOPY WITH NOVASURE ABLATION N/A 04/01/2015   Procedure: DILATATION & CURETTAGE/HYSTEROSCOPY WITH NOVASURE ABLATION;  Surgeon: [name], MD;  Location: [facility];  Service: Gynecology;  Laterality: N/A;   ENDOMETRIAL ABLATION     TONSILLECTOMY     TOTAL LAPAROSCOPIC HYSTERECTOMY WITH SALPINGECTOMY Bilateral 06/02/2018   Procedure: TOTAL LAPAROSCOPIC HYSTERECTOMY WITH SALPINGECTOMY;  Surgeon: Salvadore Dom, MD;  Location: [facility];  Service: Gynecology;  Laterality: Bilateral;  1.5 hours OR time per Dr Talbert Nan. Will need extended recovery bed.   TUBAL LIGATION      Family History  Problem Relation Age of Onset    Breast cancer Mother 51       "3x"   Lung cancer Father    Breast cancer Maternal Aunt 65   Lymphoma Paternal Grandmother 62   Colon cancer Maternal Grandfather 94   Melanoma Paternal Grandfather 101   Melanoma Cousin     Social History   Socioeconomic History   Marital status: Married    Spouse name: Not on file   Number of children: Not on file   Years of education: Not on file   Highest education level: Not on file  Occupational History   Not on file  Tobacco Use   Smoking status: Every Day    Packs/day: 0.25    Years: 10.00    Total pack years: 2.50    Types: Cigarettes   Smokeless tobacco: Never  Vaping Use   Vaping Use: Never used  Substance and Sexual Activity   Alcohol use: Not Currently   Drug use: No   Sexual activity: Yes    Birth control/protection: Surgical    Comment: Hysterectomy  Other Topics Concern   Not on file  Social History Narrative   Not on file   Social Determinants of Health   Financial Resource Strain: Not on file  Food Insecurity: Not on file  Transportation Needs: Not on file  Physical Activity: Not on file  Stress: Not on file  Social Connections: Unknown (02/27/2018)   Social Connection and Isolation Panel [NHANES]    Frequency of Communication with Friends and Family: Not on file    Frequency of Social Gatherings with Friends and Family: Not on file    Attends Religious Services: Not on file    Active Member of Clubs or Organizations: Not on file    Attends Archivist Meetings: Not on file    Marital Status: Married  Intimate Partner Violence: Not on file    Outpatient Medications Prior to Visit  Medication Sig Dispense Refill   albuterol (VENTOLIN HFA) 108 (90 Base) MCG/ACT inhaler Inhale 2 puffs into the lungs every 6 (six) hours as needed for wheezing or shortness of breath. 8 g 3   amphetamine-dextroamphetamine (ADDERALL) 5 MG tablet Take 1 tablet (5 mg total) by mouth daily. 30 tablet 0   diazepam (VALIUM) 5  MG tablet Take 1 tablet (5 mg total) by mouth every 12 (twelve) hours as needed for anxiety. 30 tablet 1   FLUoxetine (PROZAC) 20 MG capsule TAKE 1 CAPSULE(20 MG) BY MOUTH EVERY MORNING 90 capsule 1   Lisdexamfetamine Dimesylate (VYVANSE) 60 MG CHEW Chew 60 mg by mouth daily at 6 (six) AM. 30 tablet 0   ondansetron (ZOFRAN) 4 MG tablet Take 1 tablet (4 mg total) by mouth every 8 (eight) hours as needed for nausea or vomiting. 20 tablet 0   rizatriptan (MAXALT) 10 MG tablet Take 1 tablet (10 mg total) by mouth as needed for migraine. May repeat in 2 hours if needed 10 tablet 1   Vitamin D, Ergocalciferol, (DRISDOL) 1.25 MG (50000 UNIT) CAPS capsule Take 1 capsule (50,000 Units total) by mouth every 7 (seven) days. 12 capsule 1   cyanocobalamin (,VITAMIN B-12,) 1000 MCG/ML injection Inject 1 mL (1,000 mcg total) into the muscle every 30 (thirty) days. (Patient not taking: Reported on 05/18/2022) 30 mL 0   No facility-administered medications prior to visit.    Allergies  Allergen Reactions   Penicillins Anaphylaxis, Shortness Of Breath and Other (See Comments)    Tachycardia  Has patient had a PCN reaction causing immediate rash, facial/tongue/throat swelling, SOB or lightheadedness with hypotension: Yes Has patient had a PCN reaction causing severe rash involving mucus membranes or skin necrosis: No Has patient had a PCN reaction that required hospitalization: No Has patient had a PCN reaction occurring within the last 10 years: Yes If all of the above answers are "NO", then may proceed with Cephalosporin use.    Bupropion Rash    Blister        Objective:    Physical Exam Constitutional:      General: She is not in acute distress.    Appearance: Normal appearance. She is not ill-appearing.  HENT:     Right Ear: Tympanic membrane normal.     Left Ear: Tympanic membrane normal.     Nose: Congestion present. No rhinorrhea.     Right Turbinates: Swollen. Not enlarged.     Left  Turbinates: Swollen. Not enlarged.     Right Sinus: No maxillary sinus tenderness or frontal sinus tenderness.     Left Sinus: No maxillary sinus tenderness or frontal sinus tenderness.     Mouth/Throat:     Mouth: Mucous membranes are moist.     Pharynx: Posterior oropharyngeal erythema (cobblestoning) present. No pharyngeal swelling or oropharyngeal exudate.     Tonsils: No tonsillar exudate.  Eyes:     Extraocular Movements: Extraocular movements intact.  Conjunctiva/sclera: Conjunctivae normal.     Pupils: Pupils are equal, round, and reactive to light.  Neck:     Thyroid: No thyroid mass.  Cardiovascular:     Rate and Rhythm: Normal rate and regular rhythm.  Pulmonary:     Effort: Pulmonary effort is normal.     Breath sounds: Normal breath sounds.  Lymphadenopathy:     Cervical:     Right cervical: No superficial cervical adenopathy.    Left cervical: No superficial cervical adenopathy.  Neurological:     Mental Status: She is alert.     BP 120/82   Pulse 94   Temp 98 F (36.7 C)   Resp 16   Ht '5\' 2"'$  (1.575 m)   Wt 174 lb (78.9 kg)   LMP 05/07/2018 (Exact Date)   SpO2 98%   BMI 31.83 kg/m  Wt Readings from Last 3 Encounters:  05/18/22 174 lb (78.9 kg)  03/16/22 172 lb (78 kg)  07/31/21 174 lb (78.9 kg)     Health Maintenance Due  Topic Date Due   Hepatitis C Screening  Never done   COVID-19 Vaccine (3 - Pfizer risk series) 03/02/2020   PAP SMEAR-Modifier  02/27/2021   INFLUENZA VACCINE  03/20/2022    There are no preventive care reminders to display for this patient.  Lab Results  Component Value Date   TSH 2.85 01/27/2020   Lab Results  Component Value Date   WBC 9.0 03/15/2022   HGB 15.4 (H) 03/15/2022   HCT 46.2 (H) 03/15/2022   MCV 87.8 03/15/2022   PLT 237.0 03/15/2022   Lab Results  Component Value Date   NA 140 03/15/2022   K 4.1 03/15/2022   CO2 29 03/15/2022   GLUCOSE 115 (H) 03/15/2022   BUN 15 03/15/2022   CREATININE 0.81  03/15/2022   BILITOT 0.4 03/15/2022   ALKPHOS 102 03/15/2022   AST 12 03/15/2022   ALT 14 03/15/2022   PROT 6.9 03/15/2022   ALBUMIN 4.6 03/15/2022   CALCIUM 9.2 03/15/2022   ANIONGAP 12 05/29/2018   GFR 90.49 03/15/2022   Lab Results  Component Value Date   HGBA1C 6.4 03/15/2022      Assessment & Plan:   Problem List Items Addressed This Visit       Respiratory   Reactive airway disease - Primary   Relevant Medications   predniSONE (DELTASONE) 20 MG tablet   azithromycin (ZITHROMAX) 250 MG tablet   azelastine (ASTELIN) 0.1 % nasal spray   benzonatate (TESSALON) 200 MG capsule   Allergic rhinitis    Continue flonase 50 mcg daily  rx start astelin spray 0.1%      Relevant Medications   azelastine (ASTELIN) 0.1 % nasal spray   Bronchitis    rx zpack 250 mg  rx prednisone 20 mg  rx tessalon perrles 200 mg Take antibiotic as prescribed. Increase oral fluids. Pt to f/u if sx worsen and or fail to improve in 2-3 days.        Relevant Medications   predniSONE (DELTASONE) 20 MG tablet   azithromycin (ZITHROMAX) 250 MG tablet   azelastine (ASTELIN) 0.1 % nasal spray   benzonatate (TESSALON) 200 MG capsule    Meds ordered this encounter  Medications   predniSONE (DELTASONE) 20 MG tablet    Sig: Take two tablets once daily for five days    Dispense:  10 tablet    Refill:  0    Order Specific Question:   Supervising Provider  Answer:   BEDSOLE, AMY E [[id number]]   azithromycin (ZITHROMAX) 250 MG tablet    Sig: Take 2 tablets on day 1, then 1 tablet daily on days 2 through 5    Dispense:  6 tablet    Refill:  0    Order Specific Question:   Supervising Provider    Answer:   BEDSOLE, AMY E [[id number]]   azelastine (ASTELIN) 0.1 % nasal spray    Sig: Place 1 spray into both nostrils 2 (two) times daily. Use in each nostril as directed    Dispense:  30 mL    Refill:  12    Order Specific Question:   Supervising Provider    Answer:   Diona Browner, AMY E [[id number]]   benzonatate  (TESSALON) 200 MG capsule    Sig: Take 1 capsule (200 mg total) by mouth 2 (two) times daily as needed for cough.    Dispense:  20 capsule    Refill:  0    Order Specific Question:   Supervising Provider    Answer:   Diona Browner, AMY E [[id number]]    Follow-up: Return for f/u with primary care provider if no improvement.    Eugenia Pancoast, FNP

## 2022-05-18 NOTE — Assessment & Plan Note (Signed)
Continue flonase 50 mcg daily  rx start astelin spray 0.1%

## 2022-06-11 ENCOUNTER — Other Ambulatory Visit: Payer: Self-pay | Admitting: Physician Assistant

## 2022-06-12 MED ORDER — LISDEXAMFETAMINE DIMESYLATE 60 MG PO CHEW: 60.0000 mg | CHEWABLE_TABLET | Freq: Every day | ORAL | 0 refills | Status: DC

## 2022-07-26 ENCOUNTER — Ambulatory Visit (INDEPENDENT_AMBULATORY_CARE_PROVIDER_SITE_OTHER): Admitting: Family

## 2022-07-26 ENCOUNTER — Encounter: Payer: Self-pay | Admitting: Family

## 2022-07-26 VITALS — BP 134/86 | HR 81 | Temp 97.7°F | Ht 62.0 in | Wt 173.0 lb

## 2022-07-26 DIAGNOSIS — J029 Acute pharyngitis, unspecified: Secondary | ICD-10-CM

## 2022-07-26 DIAGNOSIS — R12 Heartburn: Secondary | ICD-10-CM | POA: Diagnosis not present

## 2022-07-26 DIAGNOSIS — R590 Localized enlarged lymph nodes: Secondary | ICD-10-CM | POA: Insufficient documentation

## 2022-07-26 DIAGNOSIS — F17210 Nicotine dependence, cigarettes, uncomplicated: Secondary | ICD-10-CM | POA: Diagnosis not present

## 2022-07-26 DIAGNOSIS — F17200 Nicotine dependence, unspecified, uncomplicated: Secondary | ICD-10-CM

## 2022-07-26 LAB — POCT RAPID STREP A (OFFICE): Rapid Strep A Screen: NEGATIVE

## 2022-07-26 MED ORDER — OMEPRAZOLE 20 MG PO CPDR: 20.0000 mg | DELAYED_RELEASE_CAPSULE | Freq: Every day | ORAL | 0 refills | Status: DC

## 2022-07-26 NOTE — Assessment & Plan Note (Signed)
Ordering US neck pending results Strep tested in office, negative.

## 2022-07-26 NOTE — Progress Notes (Signed)
Established Patient Office Visit  Subjective:  Patient ID: Haley Mcdonald, female    DOB: November 05, 1980  Age: 41 y.o. MRN: [id number]  CC:  Chief Complaint  Patient presents with   Acute Visit    HPI Haley Mcdonald is here today with concerns.   Pt here with concerns of right sided neck tenderness. She did have a slight sore throat a few weeks ago which has since resolved. She does describe the sensation of something being in her throat, and having an itching sensation but she can not see anything.   She states the right sided neck tenderness has been for about 3 weeks or more.   She did have a little cold maybe three weeks ago which has improved.  She is a cigarette smoker as well.  She does report heartburn with a recent increase as of late as well.    Past Medical History:  Diagnosis Date   ADD (attention deficit disorder) without hyperactivity    Anxiety    Asthma    as a child   Cervical dysplasia    Family history of breast cancer    Family history of lymphoma    Family history of melanoma    Headache    Migraine with aura    PONV (postoperative nausea and vomiting)    Pre-diabetes    gestational diabetes also   Pure hypercholesterolemia 12/20/2018    Past Surgical History:  Procedure Laterality Date   CERVICAL BIOPSY  W/ LOOP ELECTRODE EXCISION     CESAREAN SECTION     x 1   CYSTOSCOPY N/A 06/02/2018   Procedure: CYSTOSCOPY;  Surgeon: Salvadore Dom, MD;  Location: [facility];  Service: Gynecology;  Laterality: N/A;   DILITATION & CURRETTAGE/HYSTROSCOPY WITH NOVASURE ABLATION N/A 04/01/2015   Procedure: DILATATION & CURETTAGE/HYSTEROSCOPY WITH NOVASURE ABLATION;  Surgeon: [name], MD;  Location: [facility];  Service: Gynecology;  Laterality: N/A;   ENDOMETRIAL ABLATION     TONSILLECTOMY     TOTAL LAPAROSCOPIC HYSTERECTOMY WITH SALPINGECTOMY Bilateral 06/02/2018   Procedure: TOTAL LAPAROSCOPIC HYSTERECTOMY WITH  SALPINGECTOMY;  Surgeon: Salvadore Dom, MD;  Location: [facility];  Service: Gynecology;  Laterality: Bilateral;  1.5 hours OR time per Dr Talbert Nan. Will need extended recovery bed.   TUBAL LIGATION      Family History  Problem Relation Age of Onset   Breast cancer Mother 22       "3x"   Lung cancer Father    Breast cancer Maternal Aunt 18   Lymphoma Paternal Grandmother 41   Colon cancer Maternal Grandfather 72   Melanoma Paternal Grandfather 18   Melanoma Cousin     Social History   Socioeconomic History   Marital status: Married    Spouse name: Not on file   Number of children: Not on file   Years of education: Not on file   Highest education level: Not on file  Occupational History   Not on file  Tobacco Use   Smoking status: Every Day    Packs/day: 0.25    Years: 10.00    Total pack years: 2.50    Types: Cigarettes   Smokeless tobacco: Never  Vaping Use   Vaping Use: Never used  Substance and Sexual Activity   Alcohol use: Not Currently   Drug use: No   Sexual activity: Yes    Birth control/protection: Surgical    Comment: Hysterectomy  Other Topics Concern   Not on  file  Social History Narrative   Not on file   Social Determinants of Health   Financial Resource Strain: Not on file  Food Insecurity: Not on file  Transportation Needs: Not on file  Physical Activity: Not on file  Stress: Not on file  Social Connections: Unknown (02/27/2018)   Social Connection and Isolation Panel [NHANES]    Frequency of Communication with Friends and Family: Not on file    Frequency of Social Gatherings with Friends and Family: Not on file    Attends Religious Services: Not on file    Active Member of Clubs or Organizations: Not on file    Attends Archivist Meetings: Not on file    Marital Status: Married  Intimate Partner Violence: Not on file    Outpatient Medications Prior to Visit  Medication Sig Dispense Refill   albuterol  (VENTOLIN HFA) 108 (90 Base) MCG/ACT inhaler Inhale 2 puffs into the lungs every 6 (six) hours as needed for wheezing or shortness of breath. 8 g 3   amphetamine-dextroamphetamine (ADDERALL) 5 MG tablet Take 1 tablet (5 mg total) by mouth daily. 30 tablet 0   azelastine (ASTELIN) 0.1 % nasal spray Place 1 spray into both nostrils 2 (two) times daily. Use in each nostril as directed 30 mL 12   benzonatate (TESSALON) 200 MG capsule Take 1 capsule (200 mg total) by mouth 2 (two) times daily as needed for cough. 20 capsule 0   diazepam (VALIUM) 5 MG tablet Take 1 tablet (5 mg total) by mouth every 12 (twelve) hours as needed for anxiety. 30 tablet 1   FLUoxetine (PROZAC) 20 MG capsule TAKE 1 CAPSULE(20 MG) BY MOUTH EVERY MORNING 90 capsule 1   Lisdexamfetamine Dimesylate (VYVANSE) 60 MG CHEW Chew 60 mg by mouth daily at 6 (six) AM. 30 tablet 0   ondansetron (ZOFRAN) 4 MG tablet Take 1 tablet (4 mg total) by mouth every 8 (eight) hours as needed for nausea or vomiting. 20 tablet 0   predniSONE (DELTASONE) 20 MG tablet Take two tablets once daily for five days 10 tablet 0   rizatriptan (MAXALT) 10 MG tablet Take 1 tablet (10 mg total) by mouth as needed for migraine. May repeat in 2 hours if needed 10 tablet 1   Vitamin D, Ergocalciferol, (DRISDOL) 1.25 MG (50000 UNIT) CAPS capsule Take 1 capsule (50,000 Units total) by mouth every 7 (seven) days. 12 capsule 1   No facility-administered medications prior to visit.    Allergies  Allergen Reactions   Penicillins Anaphylaxis, Shortness Of Breath and Other (See Comments)    Tachycardia  Has patient had a PCN reaction causing immediate rash, facial/tongue/throat swelling, SOB or lightheadedness with hypotension: Yes Has patient had a PCN reaction causing severe rash involving mucus membranes or skin necrosis: No Has patient had a PCN reaction that required hospitalization: No Has patient had a PCN reaction occurring within the last 10 years: Yes If all  of the above answers are "NO", then may proceed with Cephalosporin use.    Bupropion Rash    Blister        Objective:    Physical Exam Constitutional:      General: She is not in acute distress.    Appearance: Normal appearance. She is not ill-appearing.  HENT:     Right Ear: Tympanic membrane normal.     Left Ear: Tympanic membrane normal.     Nose: Nose normal. No congestion or rhinorrhea.     Right Turbinates:  Not enlarged or swollen.     Left Turbinates: Not enlarged or swollen.     Right Sinus: No maxillary sinus tenderness or frontal sinus tenderness.     Left Sinus: No maxillary sinus tenderness or frontal sinus tenderness.     Mouth/Throat:     Mouth: Mucous membranes are moist.     Tongue: No lesions.     Pharynx: Posterior oropharyngeal erythema present. No pharyngeal swelling or oropharyngeal exudate.     Tonsils: No tonsillar exudate or tonsillar abscesses.  Eyes:     Extraocular Movements: Extraocular movements intact.     Conjunctiva/sclera: Conjunctivae normal.     Pupils: Pupils are equal, round, and reactive to light.  Neck:     Thyroid: No thyroid mass, thyromegaly or thyroid tenderness.     Trachea: Trachea normal.  Cardiovascular:     Rate and Rhythm: Normal rate and regular rhythm.  Pulmonary:     Effort: Pulmonary effort is normal.     Breath sounds: Normal breath sounds.  Musculoskeletal:     Cervical back: No muscular tenderness.  Lymphadenopathy:     Cervical: Cervical adenopathy present.     Right cervical: Superficial cervical adenopathy and deep cervical adenopathy present.     Left cervical: No superficial cervical adenopathy.  Neurological:     Mental Status: She is alert.     BP 134/86   Pulse 81   Temp 97.7 F (36.5 C)   Ht '5\' 2"'$  (1.575 m)   Wt 173 lb (78.5 kg)   LMP 05/07/2018 (Exact Date)   SpO2 98%   BMI 31.64 kg/m  Wt Readings from Last 3 Encounters:  07/26/22 173 lb (78.5 kg)  05/18/22 174 lb (78.9 kg)  03/16/22 172  lb (78 kg)     Health Maintenance Due  Topic Date Due   Hepatitis C Screening  Never done   COVID-19 Vaccine (3 - Pfizer risk series) 03/02/2020   PAP SMEAR-Modifier  02/27/2021   INFLUENZA VACCINE  03/20/2022    There are no preventive care reminders to display for this patient.  Lab Results  Component Value Date   TSH 2.85 01/27/2020   Lab Results  Component Value Date   WBC 9.0 03/15/2022   HGB 15.4 (H) 03/15/2022   HCT 46.2 (H) 03/15/2022   MCV 87.8 03/15/2022   PLT 237.0 03/15/2022   Lab Results  Component Value Date   NA 140 03/15/2022   K 4.1 03/15/2022   CO2 29 03/15/2022   GLUCOSE 115 (H) 03/15/2022   BUN 15 03/15/2022   CREATININE 0.81 03/15/2022   BILITOT 0.4 03/15/2022   ALKPHOS 102 03/15/2022   AST 12 03/15/2022   ALT 14 03/15/2022   PROT 6.9 03/15/2022   ALBUMIN 4.6 03/15/2022   CALCIUM 9.2 03/15/2022   ANIONGAP 12 05/29/2018   GFR 90.49 03/15/2022   Lab Results  Component Value Date   HGBA1C 6.4 03/15/2022      Assessment & Plan:   Problem List Items Addressed This Visit       Immune and Lymphatic   Cervical lymphadenopathy    Ordering US neck pending results Strep tested in office, negative.      Relevant Orders   US Soft Tissue Head/Neck (NON-THYROID)     Other   Tobacco dependence    Smoking cessation instruction/counseling given:  counseled patient on the dangers of tobacco use, advised patient to stop smoking, and reviewed strategies to maximize success  Cigarette smoker   Relevant Orders   US Soft Tissue Head/Neck (NON-THYROID)   Heartburn    Trial omeprazole rx 20 mg  Try to decrease and or avoid spicy foods, fried fatty foods, and also caffeine and chocolate as these can increase heartburn symptoms.        Relevant Medications   omeprazole (PRILOSEC) 20 MG capsule   Other Visit Diagnoses     Sore throat    -  Primary   Relevant Orders   POCT rapid strep A (Completed)   US Soft Tissue Head/Neck  (NON-THYROID)       Meds ordered this encounter  Medications   omeprazole (PRILOSEC) 20 MG capsule    Sig: Take 1 capsule (20 mg total) by mouth daily.    Dispense:  30 capsule    Refill:  0    Order Specific Question:   Supervising Provider    Answer:   Diona Browner, AMY E [[id number]]    Follow-up: Return for f/u with primary care provider if no improvement.    Eugenia Pancoast, FNP

## 2022-07-26 NOTE — Assessment & Plan Note (Signed)
Smoking cessation instruction/counseling given:  counseled patient on the dangers of tobacco use, advised patient to stop smoking, and reviewed strategies to maximize success 

## 2022-07-26 NOTE — Patient Instructions (Addendum)
   Your imaging for transvaginal ultrasound of your pelvis Has been ordered at the following location.  Please call to schedule a time and date that would work for you.    [address] [place] Phone [phone],  8-430 pm      Regards,   Eugenia Pancoast FNP-C

## 2022-07-26 NOTE — Assessment & Plan Note (Signed)
Trial omeprazole rx 20 mg  Try to decrease and or avoid spicy foods, fried fatty foods, and also caffeine and chocolate as these can increase heartburn symptoms.

## 2022-07-27 ENCOUNTER — Encounter: Payer: Self-pay | Admitting: *Deleted

## 2022-07-31 ENCOUNTER — Other Ambulatory Visit: Payer: Self-pay

## 2022-07-31 ENCOUNTER — Other Ambulatory Visit: Payer: Self-pay | Admitting: Physician Assistant

## 2022-07-31 MED ORDER — LISDEXAMFETAMINE DIMESYLATE 60 MG PO CHEW: 60.0000 mg | CHEWABLE_TABLET | Freq: Every day | ORAL | 0 refills | Status: DC

## 2022-08-02 ENCOUNTER — Encounter: Payer: Self-pay | Admitting: *Deleted

## 2022-08-27 ENCOUNTER — Ambulatory Visit (INDEPENDENT_AMBULATORY_CARE_PROVIDER_SITE_OTHER): Payer: 59 | Admitting: Family

## 2022-08-27 ENCOUNTER — Other Ambulatory Visit: Payer: Self-pay

## 2022-08-27 VITALS — BP 148/99 | HR 88 | Temp 98.0°F | Resp 18 | Wt 158.0 lb

## 2022-08-27 DIAGNOSIS — S6992XA Unspecified injury of left wrist, hand and finger(s), initial encounter: Secondary | ICD-10-CM

## 2022-08-27 DIAGNOSIS — I10 Essential (primary) hypertension: Secondary | ICD-10-CM

## 2022-08-27 DIAGNOSIS — L03012 Cellulitis of left finger: Secondary | ICD-10-CM

## 2022-08-27 MED ORDER — MUPIROCIN CALCIUM 2 % EX CREA
1.0000 | TOPICAL_CREAM | Freq: Two times a day (BID) | CUTANEOUS | 0 refills | Status: DC
  Filled 2022-08-27: qty 15, 8d supply, fill #0

## 2022-08-27 MED ORDER — SULFAMETHOXAZOLE-TRIMETHOPRIM 800-160 MG PO TABS
1.0000 | ORAL_TABLET | Freq: Two times a day (BID) | ORAL | 0 refills | Status: AC
  Filled 2022-08-27: qty 14, 7d supply, fill #0

## 2022-08-27 MED ORDER — SULFAMETHOXAZOLE-TRIMETHOPRIM 800-160 MG PO TABS: 1.0000 | ORAL_TABLET | Freq: Two times a day (BID) | ORAL | 0 refills | Status: DC

## 2022-08-27 NOTE — Assessment & Plan Note (Addendum)
Please monitor site for worsening signs/symptoms of infection to include: increasing redness, increasing tenderness, increase in size, and or pustulant drainage from site. If this is to occur please let me know immediately.   Preventative antbx given RX bactrim 800/160 mg po bid x 7 days

## 2022-08-27 NOTE — Assessment & Plan Note (Signed)
Increased.  Pt advised of the following:  Continue medication as prescribed. Monitor blood pressure periodically and/or when you feel symptomatic. Goal is <130/90 on average. Ensure that you have rested for 30 minutes prior to checking your blood pressure. Record your readings and bring them to your next visit if necessary.work on a low sodium diet. If no improvement, please f/u with pcp

## 2022-08-27 NOTE — Progress Notes (Signed)
Established Patient Office Visit  Subjective:  Patient ID: Haley Mcdonald, female    DOB: October 20, 1980  Age: 42 y.o. MRN: [id number]  CC: No chief complaint on file.   HPI Haley Mcdonald is here today with concerns.   Yesterday 1/8 while chopping potatoes, she accidentally sliced the tip of her left 4th metatarsal.  She states was bleeding last night, she applied tight koban and bleeding has dissipated a bit this am. Denies any blood thinners and or herbal supplements.   She does have pain when trying to change her bandages. No pus or drainage besides dry blood this am.  She is utd on tetanus.   Past Medical History:  Diagnosis Date   ADD (attention deficit disorder) without hyperactivity    Anxiety    Asthma    as a child   Cervical dysplasia    Family history of breast cancer    Family history of lymphoma    Family history of melanoma    Headache    Migraine with aura    PONV (postoperative nausea and vomiting)    Pre-diabetes    gestational diabetes also   Pure hypercholesterolemia 12/20/2018    Past Surgical History:  Procedure Laterality Date   CERVICAL BIOPSY  W/ LOOP ELECTRODE EXCISION     CESAREAN SECTION     x 1   CYSTOSCOPY N/A 06/02/2018   Procedure: CYSTOSCOPY;  Surgeon: Salvadore Dom, MD;  Location: [facility];  Service: Gynecology;  Laterality: N/A;   DILITATION & CURRETTAGE/HYSTROSCOPY WITH NOVASURE ABLATION N/A 04/01/2015   Procedure: DILATATION & CURETTAGE/HYSTEROSCOPY WITH NOVASURE ABLATION;  Surgeon: [name], MD;  Location: [facility];  Service: Gynecology;  Laterality: N/A;   ENDOMETRIAL ABLATION     TONSILLECTOMY     TOTAL LAPAROSCOPIC HYSTERECTOMY WITH SALPINGECTOMY Bilateral 06/02/2018   Procedure: TOTAL LAPAROSCOPIC HYSTERECTOMY WITH SALPINGECTOMY;  Surgeon: Salvadore Dom, MD;  Location: [facility];  Service: Gynecology;  Laterality: Bilateral;  1.5 hours OR time per Dr Talbert Nan.  Will need extended recovery bed.   TUBAL LIGATION      Family History  Problem Relation Age of Onset   Breast cancer Mother 19       "3x"   Lung cancer Father    Breast cancer Maternal Aunt 9   Lymphoma Paternal Grandmother 83   Colon cancer Maternal Grandfather 43   Melanoma Paternal Grandfather 19   Melanoma Cousin     Social History   Socioeconomic History   Marital status: Married    Spouse name: Not on file   Number of children: Not on file   Years of education: Not on file   Highest education level: Not on file  Occupational History   Not on file  Tobacco Use   Smoking status: Every Day    Packs/day: 0.25    Years: 10.00    Total pack years: 2.50    Types: Cigarettes   Smokeless tobacco: Never  Vaping Use   Vaping Use: Never used  Substance and Sexual Activity   Alcohol use: Not Currently   Drug use: No   Sexual activity: Yes    Birth control/protection: Surgical    Comment: Hysterectomy  Other Topics Concern   Not on file  Social History Narrative   Not on file   Social Determinants of Health   Financial Resource Strain: Not on file  Food Insecurity: Not on file  Transportation Needs: Not on file  Physical Activity:  Not on file  Stress: Not on file  Social Connections: Unknown (02/27/2018)   Social Connection and Isolation Panel [NHANES]    Frequency of Communication with Friends and Family: Not on file    Frequency of Social Gatherings with Friends and Family: Not on file    Attends Religious Services: Not on file    Active Member of Clubs or Organizations: Not on file    Attends Archivist Meetings: Not on file    Marital Status: Married  Intimate Partner Violence: Not on file    Outpatient Medications Prior to Visit  Medication Sig Dispense Refill   albuterol (VENTOLIN HFA) 108 (90 Base) MCG/ACT inhaler Inhale 2 puffs into the lungs every 6 (six) hours as needed for wheezing or shortness of breath. 8 g 3    amphetamine-dextroamphetamine (ADDERALL) 5 MG tablet Take 1 tablet (5 mg total) by mouth daily. 30 tablet 0   azelastine (ASTELIN) 0.1 % nasal spray Place 1 spray into both nostrils 2 (two) times daily. Use in each nostril as directed 30 mL 12   diazepam (VALIUM) 5 MG tablet Take 1 tablet (5 mg total) by mouth every 12 (twelve) hours as needed for anxiety. 30 tablet 1   FLUoxetine (PROZAC) 20 MG capsule TAKE 1 CAPSULE(20 MG) BY MOUTH EVERY MORNING 90 capsule 1   Lisdexamfetamine Dimesylate (VYVANSE) 60 MG CHEW Chew 60 mg by mouth daily at 6 (six) AM. 30 tablet 0   omeprazole (PRILOSEC) 20 MG capsule Take 1 capsule (20 mg total) by mouth daily. 30 capsule 0   rizatriptan (MAXALT) 10 MG tablet Take 1 tablet (10 mg total) by mouth as needed for migraine. May repeat in 2 hours if needed 10 tablet 1   No facility-administered medications prior to visit.    Allergies  Allergen Reactions   Penicillins Anaphylaxis, Shortness Of Breath and Other (See Comments)    Tachycardia  Has patient had a PCN reaction causing immediate rash, facial/tongue/throat swelling, SOB or lightheadedness with hypotension: Yes Has patient had a PCN reaction causing severe rash involving mucus membranes or skin necrosis: No Has patient had a PCN reaction that required hospitalization: No Has patient had a PCN reaction occurring within the last 10 years: Yes If all of the above answers are "NO", then may proceed with Cephalosporin use.    Bupropion Rash    Blister        Objective:    Physical Exam Constitutional:      Appearance: Normal appearance.  Pulmonary:     Effort: Pulmonary effort is normal.  Skin:    Comments: Left tip of 4th metatarsal with slight laceration with bloody discharge, not actively bleeding. No surrounding erythema. No pus or discharge. FROM of finger.   Neurological:     General: No focal deficit present.     Mental Status: She is alert and oriented to person, place, and time. Mental  status is at baseline.  Psychiatric:        Mood and Affect: Mood normal.        Behavior: Behavior normal.        Thought Content: Thought content normal.        Judgment: Judgment normal.       BP (!) 148/99   Temp 98 F (36.7 C)   Wt 158 lb (71.7 kg)   LMP 05/07/2018 (Exact Date)   BMI 28.90 kg/m  Wt Readings from Last 3 Encounters:  08/27/22 158 lb (71.7 kg)  07/26/22 173  lb (78.5 kg)  05/18/22 174 lb (78.9 kg)     Health Maintenance Due  Topic Date Due   Hepatitis C Screening  Never done   COVID-19 Vaccine (3 - Pfizer risk series) 03/02/2020   PAP SMEAR-Modifier  02/27/2021   INFLUENZA VACCINE  03/20/2022    There are no preventive care reminders to display for this patient.  Lab Results  Component Value Date   TSH 2.85 01/27/2020   Lab Results  Component Value Date   WBC 9.0 03/15/2022   HGB 15.4 (H) 03/15/2022   HCT 46.2 (H) 03/15/2022   MCV 87.8 03/15/2022   PLT 237.0 03/15/2022   Lab Results  Component Value Date   NA 140 03/15/2022   K 4.1 03/15/2022   CO2 29 03/15/2022   GLUCOSE 115 (H) 03/15/2022   BUN 15 03/15/2022   CREATININE 0.81 03/15/2022   BILITOT 0.4 03/15/2022   ALKPHOS 102 03/15/2022   AST 12 03/15/2022   ALT 14 03/15/2022   PROT 6.9 03/15/2022   ALBUMIN 4.6 03/15/2022   CALCIUM 9.2 03/15/2022   ANIONGAP 12 05/29/2018   GFR 90.49 03/15/2022   Lab Results  Component Value Date   HGBA1C 6.4 03/15/2022      Assessment & Plan:   Problem List Items Addressed This Visit       Cardiovascular and Mediastinum   Essential hypertension    Increased.  Pt advised of the following:  Continue medication as prescribed. Monitor blood pressure periodically and/or when you feel symptomatic. Goal is <130/90 on average. Ensure that you have rested for 30 minutes prior to checking your blood pressure. Record your readings and bring them to your next visit if necessary.work on a low sodium diet. If no improvement, please f/u with pcp          Other   Injury, finger, left, initial encounter - Primary    Please monitor site for worsening signs/symptoms of infection to include: increasing redness, increasing tenderness, increase in size, and or pustulant drainage from site. If this is to occur please let me know immediately.   Preventative antbx given RX bactrim 800/160 mg po bid x 7 days          Relevant Medications   mupirocin cream (BACTROBAN) 2 %   sulfamethoxazole-trimethoprim (BACTRIM DS) 800-160 MG tablet   Cellulitis of finger of left hand   Relevant Medications   mupirocin cream (BACTROBAN) 2 %   sulfamethoxazole-trimethoprim (BACTRIM DS) 800-160 MG tablet    Meds ordered this encounter  Medications   DISCONTD: sulfamethoxazole-trimethoprim (BACTRIM DS) 800-160 MG tablet    Sig: Take 1 tablet by mouth 2 (two) times daily for 7 days.    Dispense:  14 tablet    Refill:  0    Order Specific Question:   Supervising Provider    Answer:   BEDSOLE, AMY E [[id number]]   mupirocin cream (BACTROBAN) 2 %    Sig: Apply 1 Application topically 2 (two) times daily.    Dispense:  15 g    Refill:  0    Order Specific Question:   Supervising Provider    Answer:   BEDSOLE, AMY E [[id number]]   sulfamethoxazole-trimethoprim (BACTRIM DS) 800-160 MG tablet    Sig: Take 1 tablet by mouth 2 (two) times daily for 7 days.    Dispense:  14 tablet    Refill:  0    Order Specific Question:   Supervising Provider    Answer:   Diona Browner, AMY  E [[id number]]    Follow-up: No follow-ups on file.    Eugenia Pancoast, FNP

## 2022-08-27 NOTE — Patient Instructions (Signed)
  Please monitor site for worsening signs/symptoms of infection to include: increasing redness, increasing tenderness, increase in size, and or pustulant drainage from site. If this is to occur please let me know immediately.    Regards,   Eugenia Pancoast FNP-C

## 2022-08-28 ENCOUNTER — Other Ambulatory Visit: Payer: Self-pay

## 2022-09-02 ENCOUNTER — Other Ambulatory Visit: Payer: Self-pay | Admitting: Physician Assistant

## 2022-09-12 ENCOUNTER — Other Ambulatory Visit: Payer: Self-pay

## 2022-09-12 ENCOUNTER — Encounter: Payer: Self-pay | Admitting: Physician Assistant

## 2022-09-12 ENCOUNTER — Telehealth (INDEPENDENT_AMBULATORY_CARE_PROVIDER_SITE_OTHER): Payer: 59 | Admitting: Physician Assistant

## 2022-09-12 DIAGNOSIS — E88819 Insulin resistance, unspecified: Secondary | ICD-10-CM

## 2022-09-12 DIAGNOSIS — E538 Deficiency of other specified B group vitamins: Secondary | ICD-10-CM | POA: Diagnosis not present

## 2022-09-12 DIAGNOSIS — I10 Essential (primary) hypertension: Secondary | ICD-10-CM

## 2022-09-12 DIAGNOSIS — E782 Mixed hyperlipidemia: Secondary | ICD-10-CM | POA: Diagnosis not present

## 2022-09-12 DIAGNOSIS — F988 Other specified behavioral and emotional disorders with onset usually occurring in childhood and adolescence: Secondary | ICD-10-CM

## 2022-09-12 DIAGNOSIS — E559 Vitamin D deficiency, unspecified: Secondary | ICD-10-CM | POA: Diagnosis not present

## 2022-09-12 MED ORDER — LISDEXAMFETAMINE DIMESYLATE 70 MG PO CAPS
70.0000 mg | ORAL_CAPSULE | Freq: Every day | ORAL | 0 refills | Status: DC
  Filled 2022-09-12: qty 30, 30d supply, fill #0

## 2022-09-12 MED ORDER — LISDEXAMFETAMINE DIMESYLATE 70 MG PO CAPS
70.0000 mg | ORAL_CAPSULE | Freq: Every day | ORAL | 0 refills | Status: DC
  Filled 2022-11-26: qty 30, 30d supply, fill #0

## 2022-09-12 MED ORDER — LISDEXAMFETAMINE DIMESYLATE 70 MG PO CAPS
70.0000 mg | ORAL_CAPSULE | Freq: Every day | ORAL | 0 refills | Status: DC
  Filled 2022-10-17: qty 30, 30d supply, fill #0

## 2022-09-12 NOTE — Progress Notes (Signed)
Virtual Visit via Video Note   I, Inda Coke, connected with  Haley Mcdonald  ([id number], 1980/11/23) on 09/12/22 at  3:40 PM EST by a video-enabled telemedicine application and verified that I am speaking with the correct person using two identifiers.  Location: Patient: Home Provider: [place] office   I discussed the limitations of evaluation and management by telemedicine and the availability of in person appointments. The patient expressed understanding and agreed to proceed.    History of Present Illness: Haley Mcdonald is a 42 y.o. who identifies as a female who was assigned female at birth, and is being seen today for the following:  Essential hypertension Currently taking no medication. At home blood pressure readings are: not regularly checked. Patient denies chest pain, SOB, blurred vision, dizziness, unusual headaches, lower leg swelling. Patient is compliant with medication. Denies stimulant usage, excessive alcohol intake, or increase in salt consumption.  BP Readings from Last 3 Encounters:  08/27/22 (!) 148/99  07/26/22 134/86  05/18/22 120/82   Insulin resistance Trying to work on healthy diet as able  Vitamin D deficiency Does not take Vit D consistently, would like this rechecked  ADD (attention deficit disorder) without hyperactivity Takes Vyvanse 60 mg chew because Vyvanse 70 mg is on backorder Medication works well but she would like to go back to 70 mg if available at pharmacy Denies any concerning side effects with medication  Mixed hyperlipidemia Continues to smoke and eat frequent fast food Currently not on medication  B12 deficiency Does not take B12 consistently Was recommended to do injections but did not do them consistently Denies numbness/tingling  Problems:  Patient Active Problem List   Diagnosis Date Noted   Injury, finger, left, initial encounter 08/27/2022   Cellulitis of finger of left hand  08/27/2022   Cigarette smoker 07/26/2022   Cervical lymphadenopathy 07/26/2022   Heartburn 07/26/2022   Reactive airway disease 05/18/2022   Allergic rhinitis 05/18/2022   Essential hypertension 09/23/2019   B12 deficiency 09/23/2019   Migraine without aura and without status migrainosus, not intractable 09/23/2019   Anxiety and depression 07/08/2019   At high risk for breast cancer 07/06/2019   Elevated hemoglobin ([place]) 12/20/2018   Vitamin D deficiency 12/20/2018   Mixed hyperlipidemia 12/20/2018   Tobacco dependence 06/12/2018   Status post laparoscopic hysterectomy 06/02/2018   Insulin resistance    Family history of breast cancer    Family history of melanoma    Family history of lymphoma    Cervical dysplasia    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    Allergies:  Allergies  Allergen Reactions   Penicillins Anaphylaxis, Shortness Of Breath and Other (See Comments)    Tachycardia  Has patient had a PCN reaction causing immediate rash, facial/tongue/throat swelling, SOB or lightheadedness with hypotension: Yes Has patient had a PCN reaction causing severe rash involving mucus membranes or skin necrosis: No Has patient had a PCN reaction that required hospitalization: No Has patient had a PCN reaction occurring within the last 10 years: Yes If all of the above answers are "NO", then may proceed with Cephalosporin use.    Bupropion Rash    Blister   Medications:  Current Outpatient Medications:    lisdexamfetamine (VYVANSE) 70 MG capsule, Take 1 capsule (70 mg total) by mouth daily before breakfast., Disp: 30 capsule, Rfl: 0   [START ON 10/12/2022] lisdexamfetamine (VYVANSE) 70 MG capsule, Take 1 capsule (70 mg total) by mouth daily before breakfast.,  Disp: 30 capsule, Rfl: 0   [START ON 11/11/2022] lisdexamfetamine (VYVANSE) 70 MG capsule, Take 1 capsule (70 mg total) by mouth daily before breakfast., Disp: 30 capsule, Rfl: 0   albuterol  (VENTOLIN HFA) 108 (90 Base) MCG/ACT inhaler, Inhale 2 puffs into the lungs every 6 (six) hours as needed for wheezing or shortness of breath., Disp: 8 g, Rfl: 3   amphetamine-dextroamphetamine (ADDERALL) 5 MG tablet, Take 1 tablet (5 mg total) by mouth daily., Disp: 30 tablet, Rfl: 0   azelastine (ASTELIN) 0.1 % nasal spray, Place 1 spray into both nostrils 2 (two) times daily. Use in each nostril as directed, Disp: 30 mL, Rfl: 12   diazepam (VALIUM) 5 MG tablet, Take 1 tablet (5 mg total) by mouth every 12 (twelve) hours as needed for anxiety., Disp: 30 tablet, Rfl: 1   FLUoxetine (PROZAC) 20 MG capsule, TAKE 1 CAPSULE(20 MG) BY MOUTH EVERY MORNING, Disp: 90 capsule, Rfl: 1   mupirocin cream (BACTROBAN) 2 %, Apply 1 Application topically 2 (two) times daily., Disp: 15 g, Rfl: 0   omeprazole (PRILOSEC) 20 MG capsule, Take 1 capsule (20 mg total) by mouth daily., Disp: 30 capsule, Rfl: 0   rizatriptan (MAXALT) 10 MG tablet, Take 1 tablet (10 mg total) by mouth as needed for migraine. May repeat in 2 hours if needed, Disp: 10 tablet, Rfl: 1  Observations/Objective: Patient is well-developed, well-nourished in no acute distress.  Resting comfortably  at home.  Head is normocephalic, atraumatic.  No labored breathing.  Speech is clear and coherent with logical content.  Patient is alert and oriented at baseline.    Assessment and Plan: Essential hypertension Recommend monitor of BP If consistently > 140/90, recommend OV Future labs placed  Insulin resistance Work on healthier lifestyle efforts Future labs placed  Vitamin D deficiency Future labs placed -- will update and make recommendations accordingly  ADD (attention deficit disorder) without hyperactivity Increase to 70 mg vyvanse daily if available Keep close eye on BP PDMP reviewed, no red flags Follow-up in 6 months, sooner if concerns  Mixed hyperlipidemia Future labs placed -- will update and make recommendations  accordingly  B12 deficiency Future labs placed -- will update and make recommendations accordingly   Follow Up Instructions: I discussed the assessment and treatment plan with the patient. The patient was provided an opportunity to ask questions and all were answered. The patient agreed with the plan and demonstrated an understanding of the instructions.  A copy of instructions were sent to the patient via MyChart unless otherwise noted below.   The patient was advised to call back or seek an in-person evaluation if the symptoms worsen or if the condition fails to improve as anticipated.  Inda Coke, [place]

## 2022-09-18 ENCOUNTER — Other Ambulatory Visit: Payer: Self-pay | Admitting: Family

## 2022-09-18 DIAGNOSIS — R590 Localized enlarged lymph nodes: Secondary | ICD-10-CM

## 2022-09-21 ENCOUNTER — Ambulatory Visit: Payer: Self-pay

## 2022-09-24 ENCOUNTER — Ambulatory Visit
Admission: RE | Admit: 2022-09-24 | Discharge: 2022-09-24 | Disposition: A | Discharge status: Home / Self Care | Payer: 59 | Source: Ambulatory Visit | Attending: Family | Admitting: Family

## 2022-09-24 DIAGNOSIS — R590 Localized enlarged lymph nodes: Secondary | ICD-10-CM

## 2022-09-29 IMAGING — DX DG WRIST COMPLETE 3+V*L*
4 series · 4 of 4 positions shown · non-contrast
Comparison: Left forearm series including the wrist on 04/07/2019 .

CLINICAL DATA: Left wrist trauma.

EXAM:
LEFT WRIST - COMPLETE 3+ VIEW

[wrist (navicular) pa]
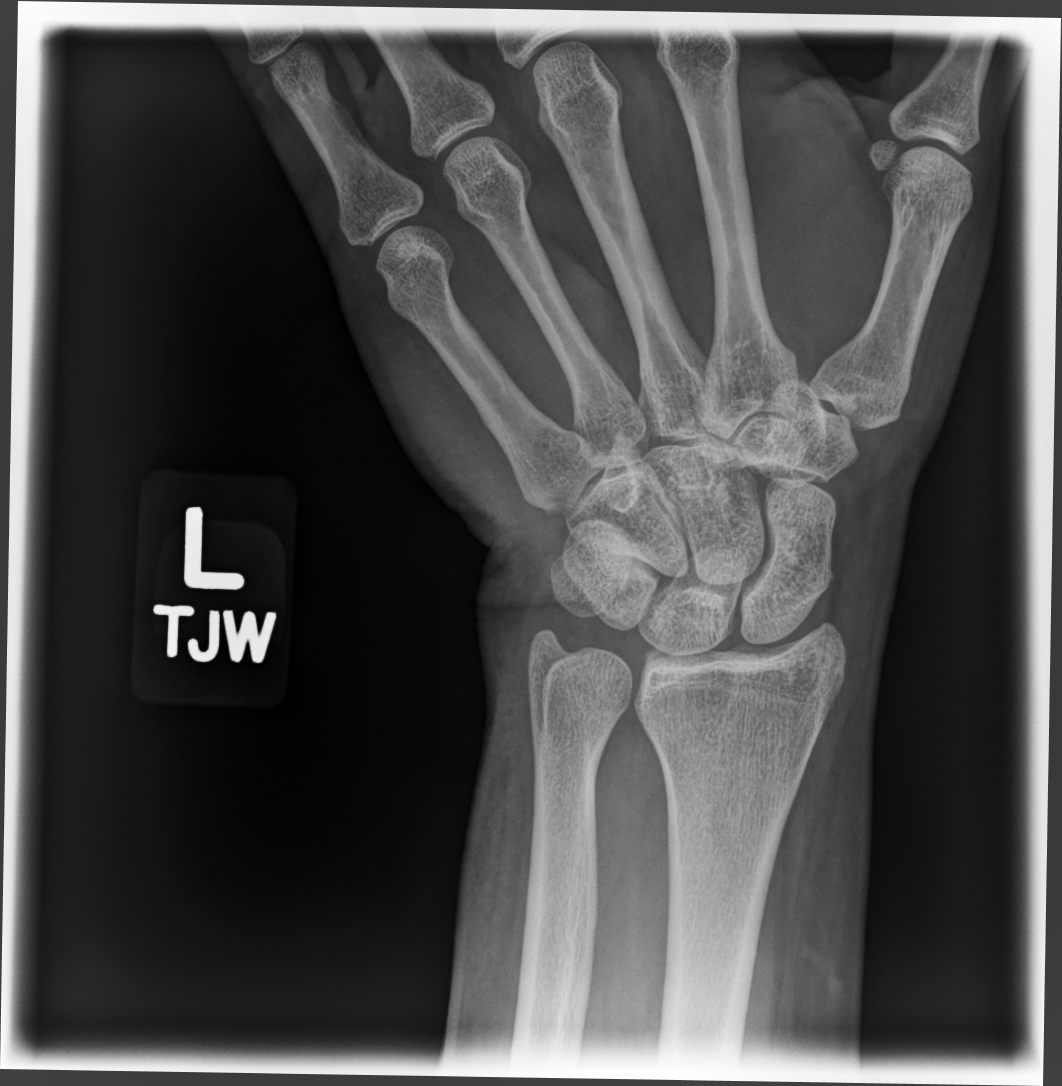

[wrist pa]
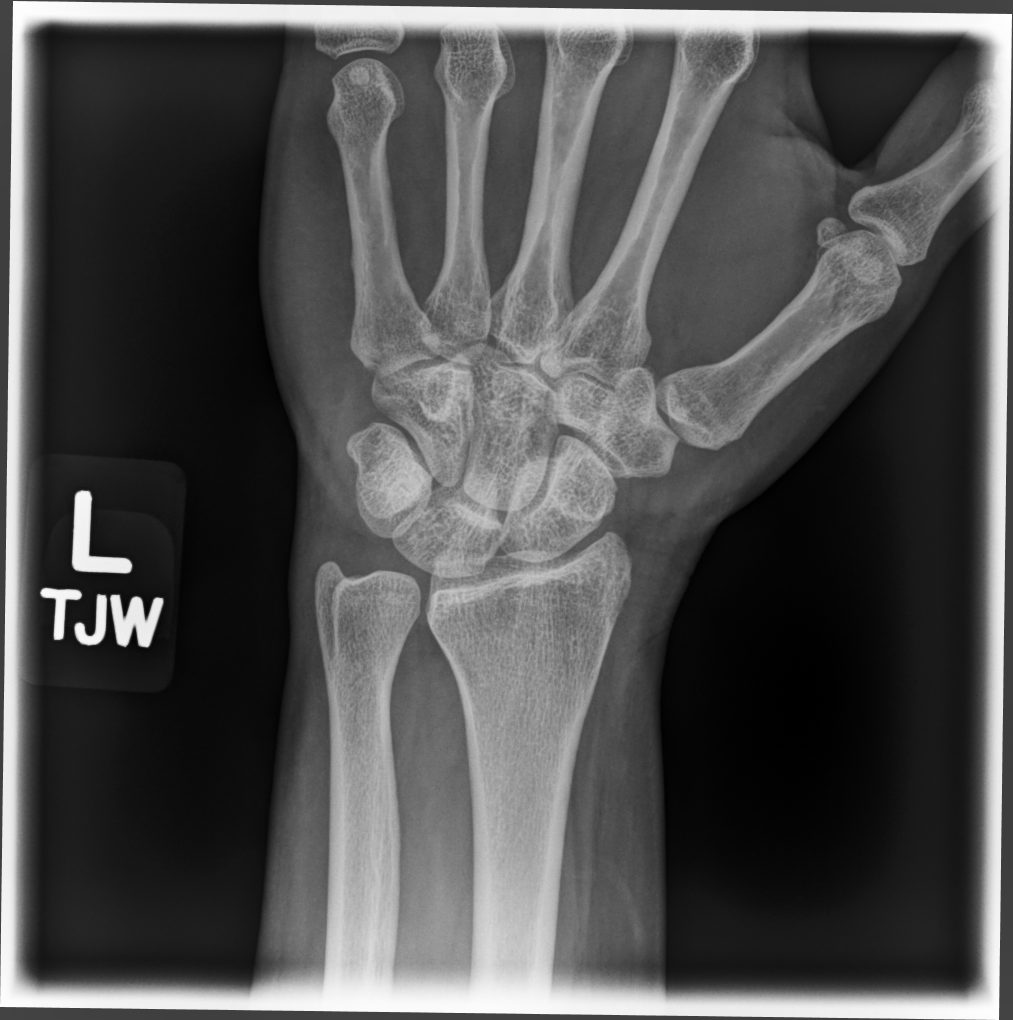

[wrist mlo]
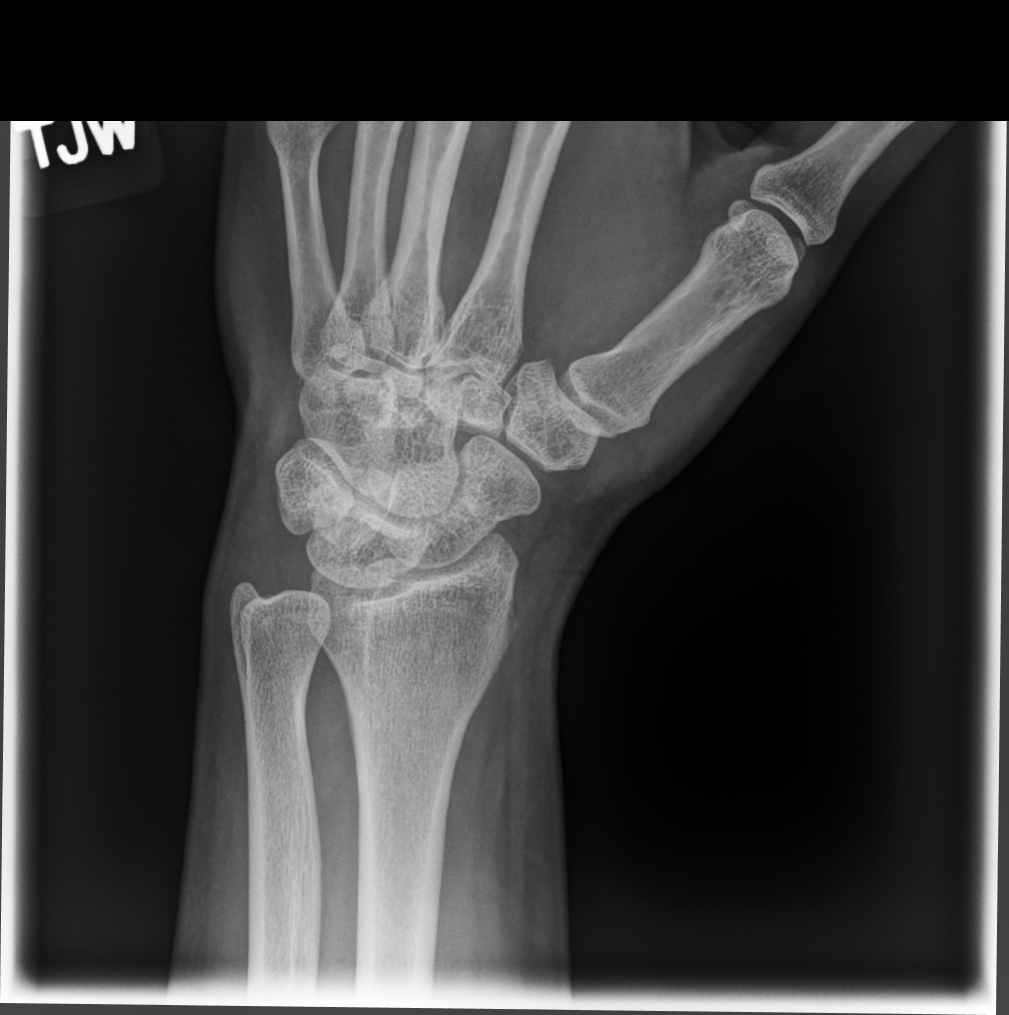

[wrist lat]
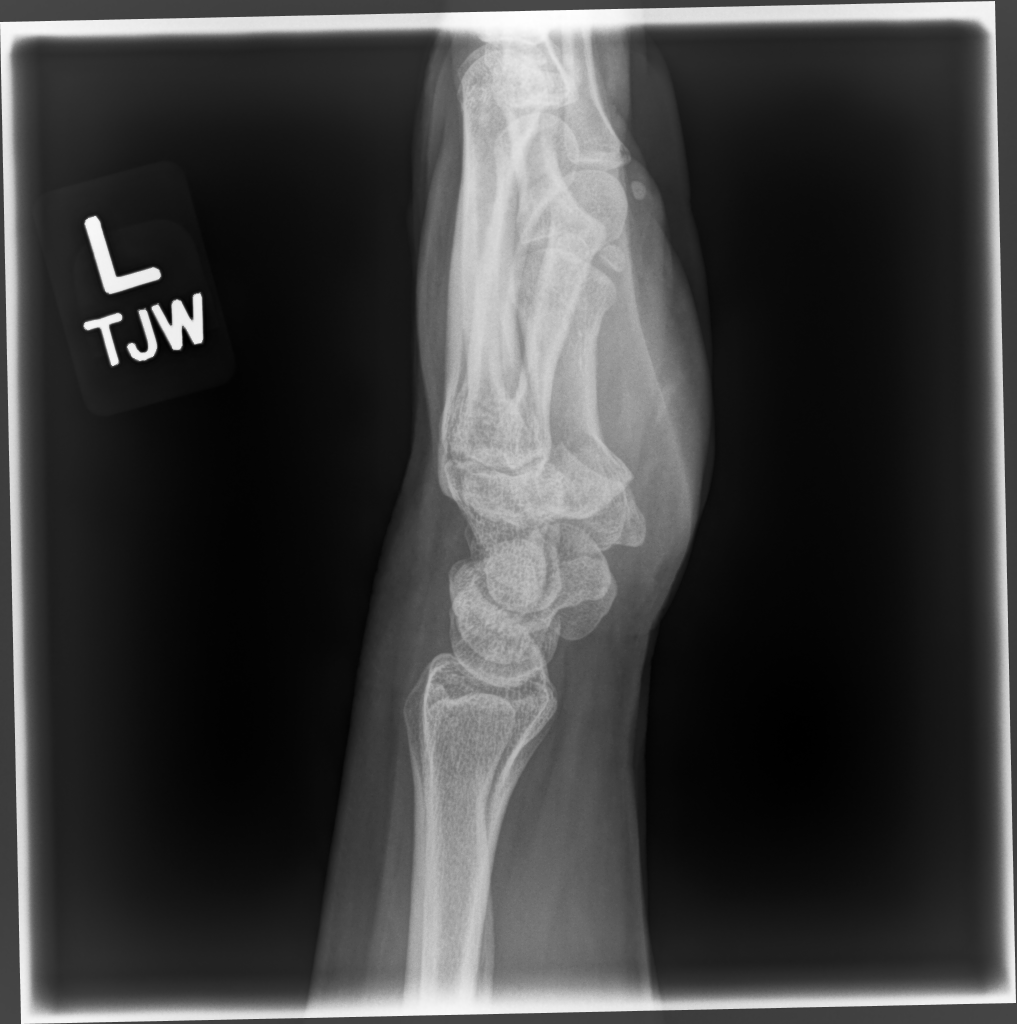

[4 of 4 positions shown; findings below may reference images not displayed]

FINDINGS: There is no evidence of fracture or dislocation. There is no
evidence of arthropathy or other focal bone abnormality. There is
dorsal soft tissue swelling.
IMPRESSION: Soft tissue swelling without evidence of fractures.

## 2022-10-08 ENCOUNTER — Encounter: Payer: Self-pay | Admitting: Physician Assistant

## 2022-10-08 ENCOUNTER — Telehealth: Payer: Self-pay | Admitting: *Deleted

## 2022-10-08 ENCOUNTER — Other Ambulatory Visit: Payer: Self-pay

## 2022-10-08 ENCOUNTER — Ambulatory Visit (INDEPENDENT_AMBULATORY_CARE_PROVIDER_SITE_OTHER): Payer: 59 | Admitting: Physician Assistant

## 2022-10-08 VITALS — BP 130/86 | HR 78 | Temp 97.8°F | Ht 63.5 in | Wt 174.0 lb

## 2022-10-08 DIAGNOSIS — E538 Deficiency of other specified B group vitamins: Secondary | ICD-10-CM

## 2022-10-08 DIAGNOSIS — E559 Vitamin D deficiency, unspecified: Secondary | ICD-10-CM | POA: Diagnosis not present

## 2022-10-08 DIAGNOSIS — F17210 Nicotine dependence, cigarettes, uncomplicated: Secondary | ICD-10-CM

## 2022-10-08 DIAGNOSIS — F988 Other specified behavioral and emotional disorders with onset usually occurring in childhood and adolescence: Secondary | ICD-10-CM | POA: Diagnosis not present

## 2022-10-08 DIAGNOSIS — Z0000 Encounter for general adult medical examination without abnormal findings: Secondary | ICD-10-CM

## 2022-10-08 DIAGNOSIS — Z1159 Encounter for screening for other viral diseases: Secondary | ICD-10-CM

## 2022-10-08 DIAGNOSIS — R070 Pain in throat: Secondary | ICD-10-CM | POA: Diagnosis not present

## 2022-10-08 DIAGNOSIS — E669 Obesity, unspecified: Secondary | ICD-10-CM | POA: Diagnosis not present

## 2022-10-08 DIAGNOSIS — E88819 Insulin resistance, unspecified: Secondary | ICD-10-CM | POA: Diagnosis not present

## 2022-10-08 DIAGNOSIS — I10 Essential (primary) hypertension: Secondary | ICD-10-CM

## 2022-10-08 MED ORDER — VARENICLINE TARTRATE (STARTER) 0.5 MG X 11 & 1 MG X 42 PO TBPK
ORAL_TABLET | ORAL | 0 refills | Status: DC
  Filled 2022-10-08: qty 53, 28d supply, fill #0

## 2022-10-08 MED ORDER — FLUOXETINE HCL 40 MG PO CAPS
40.0000 mg | ORAL_CAPSULE | Freq: Every day | ORAL | 3 refills | Status: DC
  Filled 2022-10-08: qty 90, 90d supply, fill #0
  Filled 2023-05-02: qty 90, 90d supply, fill #1
  Filled 2023-08-28 – 2023-08-30 (×2): qty 90, 90d supply, fill #2

## 2022-10-08 MED ORDER — WEGOVY 0.25 MG/0.5ML ~~LOC~~ SOAJ
0.2500 mg | SUBCUTANEOUS | 0 refills | Status: DC
  Filled 2022-10-08 – 2022-10-10 (×4): qty 2, 28d supply, fill #0

## 2022-10-08 NOTE — Telephone Encounter (Signed)
PA needed for Wegovy 0.25 mg. PA done thru Covermymeds. Awaiting determination. KEY: [id number]

## 2022-10-08 NOTE — Progress Notes (Signed)
Subjective:    Haley Mcdonald is a 42 y.o. female and is here for a comprehensive physical exam.  HPI  Health Maintenance Due  Topic Date Due   Hepatitis C Screening  Never done   Acute Concerns: L sided neck tenderness Has been dealing with this for several months Had a sore throat and then symptoms started She feels like something is in her throat, slight itching sensation but cannot get anything out or get relief; denies pain She saw another provider for this on 07/26/22 and was told to trial omeprazole -- this did not help She had u/s on 09/24/22 that was normal She has ongoing symptoms and would like to see ENT  Chronic Issues: Depression and Anxiety Taking prozac 20 mg daily Not sure it is helping much Would like to trial a higher dosage Denies SI/HI  ADD Currently taking Vyvanse 70 mg daily Tolerating well Denies concerning side effects  Tobacco use Continues to work on smoking reduction Did well with Chantix but became expensive with prior insurance, would like to trial different insurance and see if affordable  Obesity and Insulin Resistance Did well with Mancel Parsons but lost insurance coverage She would like to restart this She is exercising 3 - 4 days a week, walking (kayaking when warm) She is eating fruits, veggies and mostly plant based diet She has done really well with meal prepping  HTN Currently taking no medication. At home blood pressure readings are: normal per patient. Patient denies chest pain, SOB, blurred vision, dizziness, unusual headaches, lower leg swelling.  Denies excessive caffeine intake, stimulant usage, excessive alcohol intake, or increase in salt consumption.  BP Readings from Last 3 Encounters:  10/08/22 130/86  08/27/22 (!) 148/99  07/26/22 134/86     Health Maintenance: Immunizations -- UTD Colonoscopy -- n/a Mammogram -- UTD PAP -- n/a Diet -- working on healthy diet and exercise Exercise -- working  Sleep  habits -- no major concerns Mood -- see above  UTD with dentist? - yes UTD with eye doctor? - yes  Weight history: Wt Readings from Last 10 Encounters:  10/08/22 174 lb (78.9 kg)  08/27/22 158 lb (71.7 kg)  07/26/22 173 lb (78.5 kg)  05/18/22 174 lb (78.9 kg)  03/16/22 172 lb (78 kg)  07/31/21 174 lb (78.9 kg)  05/25/21 178 lb (80.7 kg)  02/02/21 174 lb 12 oz (79.3 kg)  12/23/20 170 lb (77.1 kg)  11/16/20 166 lb 8 oz (75.5 kg)   Body mass index is 30.34 kg/m. Patient's last menstrual period was 05/07/2018 (exact date).  Alcohol use:  reports that she does not currently use alcohol.  Tobacco use:  Tobacco Use: High Risk (10/08/2022)   Patient History    Smoking Tobacco Use: Every Day    Smokeless Tobacco Use: Never    Passive Exposure: Not on file   Eligible for lung cancer screening? N/a     10/08/2022    8:17 AM  Depression screen PHQ 2/9  Decreased Interest 2  Down, Depressed, Hopeless 2  PHQ - 2 Score 4  Altered sleeping 3  Tired, decreased energy 3  Change in appetite 1  Feeling bad or failure about yourself  2  Trouble concentrating 3  Moving slowly or fidgety/restless 0  Suicidal thoughts 0  PHQ-9 Score 16  Difficult doing work/chores Somewhat difficult     Other providers/specialists: Patient Care Team: Inda Coke, [place] as PCP - General (Physician Assistant) Salvadore Dom, MD as Consulting Physician (  Obstetrics and Gynecology) Magrinat, Virgie Dad, MD (Inactive) as Consulting Physician (Oncology)    PMHx, SurgHx, SocialHx, Medications, and Allergies were reviewed in the Visit Navigator and updated as appropriate.   Past Medical History:  Diagnosis Date   ADD (attention deficit disorder) without hyperactivity    Anxiety    Asthma    as a child   Cervical dysplasia    Family history of breast cancer    Family history of lymphoma    Family history of melanoma    Headache    Migraine with aura    PONV (postoperative nausea and  vomiting)    Pre-diabetes    gestational diabetes also   Pure hypercholesterolemia 12/20/2018     Past Surgical History:  Procedure Laterality Date   CERVICAL BIOPSY  W/ LOOP ELECTRODE EXCISION     CESAREAN SECTION     x 1   CYSTOSCOPY N/A 06/02/2018   Procedure: CYSTOSCOPY;  Surgeon: Salvadore Dom, MD;  Location: [facility];  Service: Gynecology;  Laterality: N/A;   DILITATION & CURRETTAGE/HYSTROSCOPY WITH NOVASURE ABLATION N/A 04/01/2015   Procedure: DILATATION & CURETTAGE/HYSTEROSCOPY WITH NOVASURE ABLATION;  Surgeon: [name], MD;  Location: [facility];  Service: Gynecology;  Laterality: N/A;   ENDOMETRIAL ABLATION     TONSILLECTOMY     TOTAL LAPAROSCOPIC HYSTERECTOMY WITH SALPINGECTOMY Bilateral 06/02/2018   Procedure: TOTAL LAPAROSCOPIC HYSTERECTOMY WITH SALPINGECTOMY;  Surgeon: Salvadore Dom, MD;  Location: [facility];  Service: Gynecology;  Laterality: Bilateral;  1.5 hours OR time per Dr Talbert Nan. Will need extended recovery bed.   TUBAL LIGATION       Family History  Problem Relation Age of Onset   Breast cancer Mother 41       "3x"   Lung cancer Father    Breast cancer Maternal Aunt 50   Lymphoma Paternal Grandmother 90   Colon cancer Maternal Grandfather 72   Melanoma Paternal Grandfather 47   Melanoma Cousin     Social History   Tobacco Use   Smoking status: Every Day    Packs/day: 0.25    Years: 10.00    Total pack years: 2.50    Types: Cigarettes   Smokeless tobacco: Never  Vaping Use   Vaping Use: Never used  Substance Use Topics   Alcohol use: Not Currently   Drug use: No    Review of Systems:   Review of Systems  Constitutional:  Negative for chills, fever, malaise/fatigue and weight loss.  HENT:  Negative for hearing loss, sinus pain and sore throat.   Respiratory:  Negative for cough and hemoptysis.   Cardiovascular:  Negative for chest pain, palpitations, leg swelling and PND.   Gastrointestinal:  Negative for abdominal pain, constipation, diarrhea, heartburn, nausea and vomiting.  Genitourinary:  Negative for dysuria, frequency and urgency.  Musculoskeletal:  Negative for back pain, myalgias and neck pain.  Skin:  Negative for itching and rash.  Neurological:  Negative for dizziness, tingling, seizures and headaches.  Endo/Heme/Allergies:  Negative for polydipsia.  Psychiatric/Behavioral:  Negative for depression. The patient is not nervous/anxious.     Objective:   BP 130/86 (BP Location: Left Arm, Patient Position: Sitting, Cuff Size: Large)   Pulse 78   Temp 97.8 F (36.6 C) (Temporal)   Ht 5' 3.5" (1.613 m)   Wt 174 lb (78.9 kg)   LMP 05/07/2018 (Exact Date)   SpO2 96%   BMI 30.34 kg/m  Body mass index is 30.34 kg/m.  General Appearance:    Alert, cooperative, no distress, appears stated age  Head:    Normocephalic, without obvious abnormality, atraumatic  Eyes:    PERRL, conjunctiva/corneas clear, EOM's intact, fundi    benign, both eyes  Ears:    Normal TM's and external ear canals, both ears  Nose:   Nares normal, septum midline, mucosa normal, no drainage    or sinus tenderness  Throat:   Lips, mucosa, and tongue normal; teeth and gums normal  Neck:   Supple, symmetrical, trachea midline, no adenopathy;    thyroid:  no enlargement/tenderness/nodules; no carotid   bruit or JVD  Back:     Symmetric, no curvature, ROM normal, no CVA tenderness  Lungs:     Clear to auscultation bilaterally, respirations unlabored  Chest Wall:    No tenderness or deformity   Heart:    Regular rate and rhythm, S1 and S2 normal, no murmur, rub or gallop  Breast Exam:    Deferred  Abdomen:     Soft, non-tender, bowel sounds active all four quadrants,    no masses, no organomegaly  Genitalia:    Deferred  Extremities:   Extremities normal, atraumatic, no cyanosis or edema  Pulses:   2+ and symmetric all extremities  Skin:   Skin color, texture, turgor normal,  no rashes or lesions  Lymph nodes:   Cervical, supraclavicular, and axillary nodes normal  Neurologic:   CNII-XII intact, normal strength, sensation and reflexes    throughout    Assessment/Plan:   Routine physical examination Today patient counseled on age appropriate routine health concerns for screening and prevention, each reviewed and up to date or declined. Immunizations reviewed and up to date or declined. Labs ordered and reviewed. Risk factors for depression reviewed and negative. Hearing function and visual acuity are intact. ADLs screened and addressed as needed. Functional ability and level of safety reviewed and appropriate. Education, counseling and referrals performed based on assessed risks today. Patient provided with a copy of personalized plan for preventive services.  Encounter for screening for other viral diseases Update Hep C  Throat pain Referral to ENT  Vitamin D deficiency Update levels and make recommendations  B12 deficiency Update levels and make recommendations  ADD (attention deficit disorder) without hyperactivity No red flags Continue vyvanse 70 mg daily Follow-up in 6 months  Essential hypertension Well controlled Continue close monitoring of BP at home  Insulin resistance Update A1c and provide recommendations accordingly Start Wegovy 0.25 mg weekly  Cigarette smoker Restart chantix  Obesity, unspecified classification, unspecified obesity type, unspecified whether serious comorbidity present Continue exercise and healthy eating Start Wegovy 0.25 mg weekly  Inda Coke, PA-C [place]

## 2022-10-09 ENCOUNTER — Other Ambulatory Visit: Payer: 59

## 2022-10-09 ENCOUNTER — Other Ambulatory Visit (INDEPENDENT_AMBULATORY_CARE_PROVIDER_SITE_OTHER): Payer: 59

## 2022-10-09 ENCOUNTER — Other Ambulatory Visit: Payer: Self-pay | Admitting: Physician Assistant

## 2022-10-09 DIAGNOSIS — Z1159 Encounter for screening for other viral diseases: Secondary | ICD-10-CM | POA: Diagnosis not present

## 2022-10-09 DIAGNOSIS — E88819 Insulin resistance, unspecified: Secondary | ICD-10-CM

## 2022-10-09 DIAGNOSIS — E782 Mixed hyperlipidemia: Secondary | ICD-10-CM | POA: Diagnosis not present

## 2022-10-09 DIAGNOSIS — E559 Vitamin D deficiency, unspecified: Secondary | ICD-10-CM | POA: Diagnosis not present

## 2022-10-09 DIAGNOSIS — E538 Deficiency of other specified B group vitamins: Secondary | ICD-10-CM | POA: Diagnosis not present

## 2022-10-09 LAB — COMPREHENSIVE METABOLIC PANEL
ALT: 19 U/L (ref 0–35)
AST: 15 U/L (ref 0–37)
Albumin: 4.4 g/dL (ref 3.5–5.2)
Alkaline Phosphatase: 99 U/L (ref 39–117)
BUN: 11 mg/dL (ref 6–23)
CO2: 28 mEq/L (ref 19–32)
Calcium: 9.7 mg/dL (ref 8.4–10.5)
Chloride: 100 mEq/L (ref 96–112)
Creatinine, Ser: 0.74 mg/dL (ref 0.40–1.20)
GFR: 100.45 mL/min (ref >60.00)
Glucose, Bld: 149 mg/dL — ABNORMAL HIGH (ref 70–99)
Potassium: 3.8 mEq/L (ref 3.5–5.1)
Sodium: 137 mEq/L (ref 135–145)
Total Bilirubin: 0.4 mg/dL (ref 0.2–1.2)
Total Protein: 7 g/dL (ref 6.0–8.3)

## 2022-10-09 LAB — CBC WITH DIFFERENTIAL/PLATELET
Basophils Absolute: 0 10*3/uL (ref 0.0–0.1)
Basophils Relative: 0.4 % (ref 0.0–3.0)
Eosinophils Absolute: 0.2 10*3/uL (ref 0.0–0.7)
Eosinophils Relative: 2.9 % (ref 0.0–5.0)
HCT: 47.9 % — ABNORMAL HIGH (ref 36.0–46.0)
Hemoglobin: 15.9 g/dL — ABNORMAL HIGH (ref 12.0–15.0)
Lymphocytes Relative: 36.8 % (ref 12.0–46.0)
Lymphs Abs: 3 10*3/uL (ref 0.7–4.0)
MCHC: 33.3 g/dL (ref 30.0–36.0)
MCV: 87.4 fl (ref 78.0–100.0)
Monocytes Absolute: 0.4 10*3/uL (ref 0.1–1.0)
Monocytes Relative: 5.5 % (ref 3.0–12.0)
Neutro Abs: 4.4 10*3/uL (ref 1.4–7.7)
Neutrophils Relative %: 54.4 % (ref 43.0–77.0)
Platelets: 244 10*3/uL (ref 150.0–400.0)
RBC: 5.48 Mil/uL — ABNORMAL HIGH (ref 3.87–5.11)
RDW: 13.9 % (ref 11.5–15.5)
WBC: 8.1 10*3/uL (ref 4.0–10.5)

## 2022-10-09 LAB — VITAMIN D 25 HYDROXY (VIT D DEFICIENCY, FRACTURES): VITD: 14.19 ng/mL — ABNORMAL LOW (ref 30.00–100.00)

## 2022-10-09 LAB — LIPID PANEL
Cholesterol: 186 mg/dL (ref 0–200)
HDL: 37.9 mg/dL — ABNORMAL LOW (ref >39.00)
LDL Cholesterol: 116 mg/dL — ABNORMAL HIGH (ref 0–99)
NonHDL: 147.74
Total CHOL/HDL Ratio: 5
Triglycerides: 161 mg/dL — ABNORMAL HIGH (ref 0.0–149.0)
VLDL: 32.2 mg/dL (ref 0.0–40.0)

## 2022-10-09 LAB — VITAMIN B12: Vitamin B-12: 257 pg/mL (ref 211–911)

## 2022-10-09 LAB — HEMOGLOBIN A1C: Hgb A1c MFr Bld: 6.5 % (ref 4.6–6.5)

## 2022-10-09 MED ORDER — VITAMIN D (ERGOCALCIFEROL) 1.25 MG (50000 UNIT) PO CAPS
50000.0000 [IU] | ORAL_CAPSULE | ORAL | 0 refills | Status: DC
  Filled 2022-10-09: qty 12, 84d supply, fill #0

## 2022-10-10 ENCOUNTER — Other Ambulatory Visit: Payer: Self-pay | Admitting: Physician Assistant

## 2022-10-10 ENCOUNTER — Other Ambulatory Visit: Payer: Self-pay

## 2022-10-10 LAB — HEPATITIS C ANTIBODY: Hepatitis C Ab: NONREACTIVE

## 2022-10-10 MED ORDER — CYANOCOBALAMIN 1000 MCG/ML IJ SOLN
INTRAMUSCULAR | 5 refills | Status: AC
  Filled 2022-10-10: qty 6, 90d supply, fill #0

## 2022-10-10 MED ORDER — "BD LUER-LOK SYRINGE 25G X 1"" 3 ML MISC"
0 refills | Status: AC
  Filled 2022-10-10: qty 6, fill #0
  Filled 2022-12-10: qty 6, 6d supply, fill #0
  Filled 2022-12-28: qty 6, fill #0

## 2022-10-10 MED ORDER — WEGOVY 0.5 MG/0.5ML ~~LOC~~ SOAJ
0.5000 mg | SUBCUTANEOUS | 0 refills | Status: DC
  Filled 2022-10-10 – 2022-11-26 (×3): qty 2, 28d supply, fill #0

## 2022-10-10 NOTE — Telephone Encounter (Signed)
[place] spoke Myray to told him/her PA for Mancel Parsons is approved effective 10/10/2022 to 05/01/2023. Myray Verbalized understanding.

## 2022-10-10 NOTE — Telephone Encounter (Signed)
Spoke to pt told her PA for Mancel Parsons is approved. Pharmacy is aware and getting Rx ready for you. Pt verbalized understanding. Pt asked if I can send in Vit B12 injections. Told her okay I will send to pharmacy and you need a 3 month f/u for weight check once you start [facility]. Pt verbalized understanding will call back to schedule. Rx sent to pharmacy.

## 2022-10-10 NOTE — Telephone Encounter (Signed)
Received response from [place], [place] for Haley Mcdonald is approved. The request has been approved. The authorization is effective from 10/10/2022 to 05/01/2023.

## 2022-10-15 ENCOUNTER — Other Ambulatory Visit: Payer: Self-pay

## 2022-10-17 ENCOUNTER — Other Ambulatory Visit: Payer: Self-pay

## 2022-10-21 ENCOUNTER — Other Ambulatory Visit: Payer: Self-pay

## 2022-10-23 ENCOUNTER — Other Ambulatory Visit: Payer: Self-pay

## 2022-11-02 ENCOUNTER — Other Ambulatory Visit: Payer: Self-pay

## 2022-11-07 ENCOUNTER — Other Ambulatory Visit: Payer: Self-pay

## 2022-11-07 DIAGNOSIS — J301 Allergic rhinitis due to pollen: Secondary | ICD-10-CM | POA: Diagnosis not present

## 2022-11-07 DIAGNOSIS — J342 Deviated nasal septum: Secondary | ICD-10-CM | POA: Diagnosis not present

## 2022-11-07 DIAGNOSIS — R221 Localized swelling, mass and lump, neck: Secondary | ICD-10-CM | POA: Diagnosis not present

## 2022-11-07 DIAGNOSIS — K219 Gastro-esophageal reflux disease without esophagitis: Secondary | ICD-10-CM | POA: Diagnosis not present

## 2022-11-07 MED ORDER — OMEPRAZOLE 40 MG PO CPDR
40.0000 mg | DELAYED_RELEASE_CAPSULE | Freq: Every day | ORAL | 3 refills | Status: DC
  Filled 2022-11-07: qty 30, 30d supply, fill #0
  Filled 2022-12-10: qty 30, 30d supply, fill #1

## 2022-11-07 MED ORDER — FLUTICASONE PROPIONATE 50 MCG/ACT NA SUSP
1.0000 | Freq: Every day | NASAL | 4 refills | Status: AC
  Filled 2022-11-07: qty 16, 30d supply, fill #0
  Filled 2022-12-10: qty 16, 30d supply, fill #1
  Filled 2023-05-02: qty 16, 30d supply, fill #2

## 2022-11-15 ENCOUNTER — Other Ambulatory Visit: Payer: Self-pay | Admitting: Physician Assistant

## 2022-11-15 ENCOUNTER — Other Ambulatory Visit: Payer: Self-pay

## 2022-11-15 MED ORDER — VARENICLINE TARTRATE 1 MG PO TABS
1.0000 mg | ORAL_TABLET | Freq: Two times a day (BID) | ORAL | 1 refills | Status: DC
  Filled 2022-11-15: qty 180, 90d supply, fill #0
  Filled 2023-05-02: qty 124, 62d supply, fill #1
  Filled 2023-05-02: qty 56, 28d supply, fill #1

## 2022-11-26 ENCOUNTER — Other Ambulatory Visit: Payer: Self-pay

## 2022-11-26 ENCOUNTER — Other Ambulatory Visit: Payer: Self-pay | Admitting: Physician Assistant

## 2022-12-10 ENCOUNTER — Other Ambulatory Visit: Payer: Self-pay

## 2022-12-28 ENCOUNTER — Other Ambulatory Visit: Payer: Self-pay | Admitting: Physician Assistant

## 2022-12-28 ENCOUNTER — Other Ambulatory Visit: Payer: Self-pay

## 2022-12-28 MED ORDER — LISDEXAMFETAMINE DIMESYLATE 70 MG PO CAPS
70.0000 mg | ORAL_CAPSULE | Freq: Every day | ORAL | 0 refills | Status: DC
  Filled 2022-12-28: qty 30, 30d supply, fill #0

## 2022-12-28 MED ORDER — WEGOVY 0.5 MG/0.5ML ~~LOC~~ SOAJ
0.5000 mg | SUBCUTANEOUS | 0 refills | Status: DC
  Filled 2022-12-28 – 2023-01-21 (×2): qty 2, 28d supply, fill #0

## 2022-12-28 NOTE — Telephone Encounter (Signed)
Last refill: 11/11/22 #30, 0 Last OV: 10/08/22 dx. CPE

## 2022-12-31 ENCOUNTER — Other Ambulatory Visit: Payer: Self-pay

## 2023-01-21 ENCOUNTER — Other Ambulatory Visit: Payer: Self-pay

## 2023-02-04 ENCOUNTER — Encounter: Payer: Self-pay | Admitting: Family

## 2023-02-04 ENCOUNTER — Other Ambulatory Visit: Payer: Self-pay | Admitting: Physician Assistant

## 2023-02-04 ENCOUNTER — Other Ambulatory Visit: Payer: Self-pay

## 2023-02-04 ENCOUNTER — Encounter: Payer: Self-pay | Admitting: Pharmacist

## 2023-02-04 ENCOUNTER — Ambulatory Visit (INDEPENDENT_AMBULATORY_CARE_PROVIDER_SITE_OTHER): Payer: 59 | Admitting: Family

## 2023-02-04 VITALS — BP 118/82 | HR 86 | Temp 97.6°F | Ht 63.5 in | Wt 190.0 lb

## 2023-02-04 DIAGNOSIS — R6889 Other general symptoms and signs: Secondary | ICD-10-CM | POA: Diagnosis not present

## 2023-02-04 DIAGNOSIS — H65112 Acute and subacute allergic otitis media (mucoid) (sanguinous) (serous), left ear: Secondary | ICD-10-CM | POA: Diagnosis not present

## 2023-02-04 DIAGNOSIS — Z7985 Long-term (current) use of injectable non-insulin antidiabetic drugs: Secondary | ICD-10-CM | POA: Diagnosis not present

## 2023-02-04 DIAGNOSIS — R635 Abnormal weight gain: Secondary | ICD-10-CM | POA: Diagnosis not present

## 2023-02-04 DIAGNOSIS — E119 Type 2 diabetes mellitus without complications: Secondary | ICD-10-CM | POA: Diagnosis not present

## 2023-02-04 MED ORDER — WEGOVY 0.25 MG/0.5ML ~~LOC~~ SOAJ
SUBCUTANEOUS | 1 refills | Status: DC
Start: 2023-02-04 — End: 2023-02-04
  Filled 2023-02-04: qty 6, fill #0

## 2023-02-04 MED ORDER — LEVOFLOXACIN 500 MG PO TABS
500.0000 mg | ORAL_TABLET | Freq: Every day | ORAL | 0 refills | Status: AC
Start: 2023-02-04 — End: 2023-02-11
  Filled 2023-02-04: qty 7, 7d supply, fill #0

## 2023-02-04 MED ORDER — LISDEXAMFETAMINE DIMESYLATE 70 MG PO CAPS
70.0000 mg | ORAL_CAPSULE | Freq: Every day | ORAL | 0 refills | Status: DC
  Filled 2023-02-04: qty 30, 30d supply, fill #0

## 2023-02-04 MED ORDER — WEGOVY 0.25 MG/0.5ML ~~LOC~~ SOAJ
0.2500 mg | SUBCUTANEOUS | 1 refills | Status: DC
Start: 2023-02-04 — End: 2023-03-07
  Filled 2023-02-04: qty 2, 28d supply, fill #0

## 2023-02-04 NOTE — Assessment & Plan Note (Signed)
Haley Mcdonald not initially approved however was not with diabetic dx as previously prediabetic.  Going to resubmit to insurance as pt is diabetic.  Rx wegovy 0.25 mg [place] weekly  Ordering A1c pending result. Will send to pcp once complete

## 2023-02-04 NOTE — Telephone Encounter (Signed)
Last OV: 10/08/22  Next OV: none scheduled  Last Filled: 12/28/22  Quantity: 30

## 2023-02-04 NOTE — Assessment & Plan Note (Signed)
42 pound weight gain in five months and heat intolerance Ordering thyroid panel pending results

## 2023-02-04 NOTE — Progress Notes (Signed)
Established Patient Office Visit  Subjective:   Patient ID: Haley Mcdonald, female    DOB: 28-Feb-1981  Age: 42 y.o. MRN: [id number]  CC:  Chief Complaint  Patient presents with   Ear Pain    Left ear pain since this weekend    HPI: Haley Mcdonald is a 42 y.o. female presenting on 02/04/2023 for Ear Pain (Left ear pain since this weekend)  Last night started with left ear pain, swelling inside of the ear to the front of her ear. No discharge that she has seen. Has used q tips but didn't feel increased and or report dizziness or puncture.   She thinks might have aggravated her ears because she itches it often.   No nasal congestion, no sore throat.   Pt with abnormal weight gain.  Wt Readings from Last 3 Encounters:  02/04/23 190 lb (86.2 kg)  10/08/22 174 lb (78.9 kg)  08/27/22 158 lb (71.7 kg)   Also c/o feeling hot all of the time and abnormal weight gain. Her sister has thyroid disease and also her mom.  Lab Results  Component Value Date   TSH 2.85 01/27/2020     Lab Results  Component Value Date   HGBA1C 6.5 10/09/2022      ROS: Negative unless specifically indicated above in HPI.   Relevant past medical history reviewed and updated as indicated.   Allergies and medications reviewed and updated.   Current Outpatient Medications:    albuterol (VENTOLIN HFA) 108 (90 Base) MCG/ACT inhaler, Inhale 2 puffs into the lungs every 6 (six) hours as needed for wheezing or shortness of breath., Disp: 8 g, Rfl: 3   amphetamine-dextroamphetamine (ADDERALL) 5 MG tablet, Take 1 tablet (5 mg total) by mouth daily., Disp: 30 tablet, Rfl: 0   diazepam (VALIUM) 5 MG tablet, Take 1 tablet (5 mg total) by mouth every 12 (twelve) hours as needed for anxiety., Disp: 30 tablet, Rfl: 1   FLUoxetine (PROZAC) 40 MG capsule, Take 1 capsule (40 mg total) by mouth daily., Disp: 90 capsule, Rfl: 3   fluticasone (GNP FLUTICASONE PROPIONATE) 50 MCG/ACT nasal spray, Place 1-2  sprays into both nostrils daily., Disp: 16 g, Rfl: 4   levofloxacin (LEVAQUIN) 500 MG tablet, Take 1 tablet (500 mg total) by mouth daily for 7 days., Disp: 7 tablet, Rfl: 0   omeprazole (PRILOSEC) 20 MG capsule, Take 1 capsule (20 mg total) by mouth daily., Disp: 30 capsule, Rfl: 0   omeprazole (PRILOSEC) 40 MG capsule, Take 1 capsule (40 mg total) by mouth daily 30 minutes prior to evening meal., Disp: 30 capsule, Rfl: 3   rizatriptan (MAXALT) 10 MG tablet, Take 1 tablet (10 mg total) by mouth as needed for migraine. May repeat in 2 hours if needed, Disp: 10 tablet, Rfl: 1   SYRINGE-NEEDLE, DISP, 3 ML (B-D 3CC LUER-LOK SYR 25GX1") 25G X 1" 3 ML MISC, USE TO INJECT VIT B 12., Disp: 6 each, Rfl: 0   varenicline (CHANTIX CONTINUING MONTH PAK) 1 MG tablet, Take 1 tablet (1 mg total) by mouth 2 (two) times daily., Disp: 180 tablet, Rfl: 1   Vitamin D, Ergocalciferol, (DRISDOL) 1.25 MG (50000 UNIT) CAPS capsule, Take 1 capsule (50,000 Units total) by mouth every 7 (seven) days., Disp: 12 capsule, Rfl: 0   lisdexamfetamine (VYVANSE) 70 MG capsule, Take 1 capsule (70 mg total) by mouth daily before breakfast., Disp: 30 capsule, Rfl: 0   lisdexamfetamine (VYVANSE) 70 MG capsule, Take 1 capsule (70  mg total) by mouth daily before breakfast., Disp: 30 capsule, Rfl: 0   lisdexamfetamine (VYVANSE) 70 MG capsule, Take 1 capsule (70 mg total) by mouth daily before breakfast., Disp: 30 capsule, Rfl: 0   Semaglutide-Weight Management (WEGOVY) 0.25 MG/0.5ML SOAJ, Inject 0.25 mg qweek [place], Disp: 6 mL, Rfl: 1  Allergies  Allergen Reactions   Penicillins Anaphylaxis, Shortness Of Breath and Other (See Comments)    Tachycardia  Has patient had a PCN reaction causing immediate rash, facial/tongue/throat swelling, SOB or lightheadedness with hypotension: Yes Has patient had a PCN reaction causing severe rash involving mucus membranes or skin necrosis: No Has patient had a PCN reaction that required hospitalization:  No Has patient had a PCN reaction occurring within the last 10 years: Yes If all of the above answers are "NO", then may proceed with Cephalosporin use.    Bupropion Rash    Blister    Objective:   BP 118/82 (BP Location: Left Arm, Patient Position: Sitting, Cuff Size: Normal)   Pulse 86   Temp 97.6 F (36.4 C) (Temporal)   Ht 5' 3.5" (1.613 m)   Wt 190 lb (86.2 kg)   LMP 05/07/2018 (Exact Date)   SpO2 96%   BMI 33.13 kg/m    Physical Exam Constitutional:      General: She is not in acute distress.    Appearance: Normal appearance. She is normal weight. She is not ill-appearing, toxic-appearing or diaphoretic.  HENT:     Head: Normocephalic.     Right Ear: Tympanic membrane normal.     Left Ear: External ear normal. Decreased hearing noted. Tenderness present. A middle ear effusion is present. There is no impacted cerumen. Tympanic membrane is erythematous and bulging.     Nose: Nose normal.     Mouth/Throat:     Mouth: Mucous membranes are dry.     Pharynx: No oropharyngeal exudate or posterior oropharyngeal erythema.  Eyes:     Extraocular Movements: Extraocular movements intact.     Pupils: Pupils are equal, round, and reactive to light.  Cardiovascular:     Rate and Rhythm: Normal rate and regular rhythm.     Pulses: Normal pulses.     Heart sounds: Normal heart sounds.  Pulmonary:     Effort: Pulmonary effort is normal.     Breath sounds: Normal breath sounds.  Musculoskeletal:     Cervical back: Normal range of motion.  Lymphadenopathy:     Head:     Left side of head: Preauricular adenopathy present.  Neurological:     General: No focal deficit present.     Mental Status: She is alert and oriented to person, place, and time. Mental status is at baseline.  Psychiatric:        Mood and Affect: Mood normal.        Behavior: Behavior normal.        Thought Content: Thought content normal.        Judgment: Judgment normal.     Assessment & Plan:   Non-recurrent acute allergic otitis media of left ear Assessment & Plan: Take antibiotic as prescribed. Increase oral fluids. Pt to f/u if sx worsen and or fail to improve in 2-3 days. Did recommend zyrtec nightly  Levaquin 500 mg po every day x 7 days as pcn allergies, anaphylaxis  Cephalosporins not an option   Orders: -     levoFLOXacin; Take 1 tablet (500 mg total) by mouth daily for 7 days.  Dispense: 7 tablet;  Refill: 0  Controlled type 2 diabetes mellitus without complication, without long-term current use of insulin (HCC) Assessment & Plan: ZOXWRU not initially approved however was not with diabetic dx as previously prediabetic.  Going to resubmit to insurance as pt is diabetic.  Rx wegovy 0.25 mg [place] weekly  Ordering A1c pending result. Will send to pcp once complete  Orders: -     Hemoglobin A1c; Future -     Wegovy; Inject 0.25 mg qweek [place]  Dispense: 6 mL; Refill: 1  Abnormal weight gain Assessment & Plan: 42 pound weight gain in five months and heat intolerance Ordering thyroid panel pending results  Orders: -     Thyroid Peroxidase Antibodies (TPO) (REFL); Future -     T3, free; Future -     T4, free; Future -     TSH; Future  Heat intolerance     Follow up plan: Return for f/u PCP if no improvement in symptoms.  Mort Sawyers, FNP

## 2023-02-04 NOTE — Assessment & Plan Note (Addendum)
Take antibiotic as prescribed. Increase oral fluids. Pt to f/u if sx worsen and or fail to improve in 2-3 days. Did recommend zyrtec nightly  Levaquin 500 mg po every day x 7 days as pcn allergies, anaphylaxis  Cephalosporins not an option

## 2023-02-05 ENCOUNTER — Encounter: Payer: Self-pay | Admitting: Family

## 2023-02-05 ENCOUNTER — Other Ambulatory Visit: Payer: Self-pay

## 2023-02-05 LAB — T4, FREE: Free T4: 0.7 ng/dL (ref 0.60–1.60)

## 2023-02-05 LAB — T3, FREE: T3, Free: 3 pg/mL (ref 2.3–4.2)

## 2023-02-05 LAB — TSH: TSH: 3.8 u[IU]/mL (ref 0.35–5.50)

## 2023-02-05 LAB — THYROID PEROXIDASE ANTIBODIES (TPO) (REFL): Thyroperoxidase Ab SerPl-aCnc: <1 IU/mL (ref <9)

## 2023-02-05 LAB — HEMOGLOBIN A1C: Hgb A1c MFr Bld: 6.7 % — ABNORMAL HIGH (ref 4.6–6.5)

## 2023-02-05 NOTE — Progress Notes (Signed)
Jana Half, Saw your patient in office yesterday for an ear infection and obtained some labs she said you might be waiting for.

## 2023-02-20 ENCOUNTER — Other Ambulatory Visit: Payer: Self-pay

## 2023-02-20 ENCOUNTER — Other Ambulatory Visit: Payer: Self-pay | Admitting: Family

## 2023-02-20 DIAGNOSIS — B3731 Acute candidiasis of vulva and vagina: Secondary | ICD-10-CM

## 2023-02-20 MED ORDER — FLUCONAZOLE 150 MG PO TABS
ORAL_TABLET | ORAL | 0 refills | Status: DC
Start: 2023-02-20 — End: 2023-03-07
  Filled 2023-02-20: qty 3, 9d supply, fill #0

## 2023-02-20 NOTE — Progress Notes (Signed)
Pt with c/o vaginal itching, with white vaginal discharge. Tx for candida vag

## 2023-03-07 ENCOUNTER — Ambulatory Visit (INDEPENDENT_AMBULATORY_CARE_PROVIDER_SITE_OTHER): Payer: 59 | Admitting: Family

## 2023-03-07 ENCOUNTER — Other Ambulatory Visit: Payer: Self-pay

## 2023-03-07 ENCOUNTER — Encounter: Payer: Self-pay | Admitting: Family

## 2023-03-07 VITALS — BP 124/82 | HR 81 | Temp 97.8°F | Ht 63.5 in | Wt 190.0 lb

## 2023-03-07 DIAGNOSIS — F17200 Nicotine dependence, unspecified, uncomplicated: Secondary | ICD-10-CM | POA: Diagnosis not present

## 2023-03-07 DIAGNOSIS — F32A Depression, unspecified: Secondary | ICD-10-CM

## 2023-03-07 DIAGNOSIS — F988 Other specified behavioral and emotional disorders with onset usually occurring in childhood and adolescence: Secondary | ICD-10-CM

## 2023-03-07 DIAGNOSIS — F419 Anxiety disorder, unspecified: Secondary | ICD-10-CM

## 2023-03-07 DIAGNOSIS — F411 Generalized anxiety disorder: Secondary | ICD-10-CM

## 2023-03-07 DIAGNOSIS — I10 Essential (primary) hypertension: Secondary | ICD-10-CM | POA: Diagnosis not present

## 2023-03-07 DIAGNOSIS — Z1231 Encounter for screening mammogram for malignant neoplasm of breast: Secondary | ICD-10-CM

## 2023-03-07 DIAGNOSIS — Z9189 Other specified personal risk factors, not elsewhere classified: Secondary | ICD-10-CM | POA: Diagnosis not present

## 2023-03-07 DIAGNOSIS — G43009 Migraine without aura, not intractable, without status migrainosus: Secondary | ICD-10-CM | POA: Diagnosis not present

## 2023-03-07 DIAGNOSIS — E119 Type 2 diabetes mellitus without complications: Secondary | ICD-10-CM

## 2023-03-07 DIAGNOSIS — K2100 Gastro-esophageal reflux disease with esophagitis, without bleeding: Secondary | ICD-10-CM | POA: Insufficient documentation

## 2023-03-07 LAB — HM DIABETES EYE EXAM

## 2023-03-07 LAB — MICROALBUMIN / CREATININE URINE RATIO
Creatinine,U: 129.8 mg/dL
Microalb Creat Ratio: 1 mg/g (ref 0.0–30.0)
Microalb, Ur: 1.3 mg/dL (ref 0.0–1.9)

## 2023-03-07 MED ORDER — TRULICITY 0.75 MG/0.5ML ~~LOC~~ SOAJ
0.7500 mg | SUBCUTANEOUS | 1 refills | Status: DC
Start: 2023-03-07 — End: 2023-10-01
  Filled 2023-03-07: qty 2, 28d supply, fill #0
  Filled 2023-08-28 – 2023-08-30 (×2): qty 2, 28d supply, fill #1

## 2023-03-07 MED ORDER — METFORMIN HCL 500 MG PO TABS
500.0000 mg | ORAL_TABLET | Freq: Two times a day (BID) | ORAL | 3 refills | Status: DC
Start: 2023-03-07 — End: 2023-03-18
  Filled 2023-03-07: qty 180, 90d supply, fill #0

## 2023-03-07 MED ORDER — HYDROXYZINE PAMOATE 25 MG PO CAPS
25.0000 mg | ORAL_CAPSULE | Freq: Three times a day (TID) | ORAL | 0 refills | Status: DC | PRN
Start: 2023-03-07 — End: 2023-07-05
  Filled 2023-03-07: qty 30, 10d supply, fill #0

## 2023-03-07 MED ORDER — FREESTYLE LIBRE 3 SENSOR MISC
1 refills | Status: DC
Start: 2023-03-07 — End: 2024-03-20
  Filled 2023-03-07 (×2): qty 6, 84d supply, fill #0

## 2023-03-07 NOTE — Assessment & Plan Note (Signed)
Stable today.

## 2023-03-07 NOTE — Assessment & Plan Note (Signed)
Stable Continue excedrin migraine and rizatriptan prn

## 2023-03-07 NOTE — Assessment & Plan Note (Signed)
Worsening.  Rx sent for trulicity, wegovy not covered pt willing to try again as only with slight nausea first time.  Work on diabetic diet and exercise as tolerated. Yearly foot exam, and annual eye exam.

## 2023-03-07 NOTE — Assessment & Plan Note (Signed)
Continue vyvanse 70 mg

## 2023-03-07 NOTE — Assessment & Plan Note (Signed)
Worsening Continue prozac 40 mg once daily pt declines to increase at this time.  Rx hydroxyxine trial prn  Valium prn

## 2023-03-07 NOTE — Assessment & Plan Note (Signed)
On omeprazole 40 mg once daily Did d/w her long term PPI  use and ways to change up diet  Try to decrease and or avoid spicy foods, fried fatty foods, and also caffeine and chocolate as these can increase heartburn symptoms.

## 2023-03-07 NOTE — Patient Instructions (Addendum)
I have sent an electronic order over to your preferred location for the following:   []   2D Mammogram  [x]   3D Mammogram  []   Bone Density   Please give this center a call to get scheduled at your convenience.  [x]   [facility]  [address] [place] [address]  [phone]  Make sure to wear two piece  clothing  No lotions powders or deodorants the day of the appointment Make sure to bring picture ID and insurance card.  Bring list of medications you are currently taking including any supplements.   ------------------------------------  CALL TO GET SET UP WITH THE WELLNESS TEAM FOR YOUR DIABETES NUTRITION EDUCATION   ------------------------------------  A referral was placed today for Dr. Maryruth Bun, psychiatry Please let us know if you have not heard back within 2 weeks about the referral.  Let me know about dermatology referral.   Call back to get back into high risk breast clinic:  They will consider ordering the MRI of the breast as we are going to order your screening mammo with tomo May have to consider getting on tamoxifen  Lowella Dell, MD   07/06/2019 4:58 PM Medical Oncology and Hematology [facility] [address] [place], [place] [address] Tel. [phone]    Fax. [phone]  Regards,   Mort Sawyers FNP-C

## 2023-03-07 NOTE — Assessment & Plan Note (Signed)
Set alarm to remember twice daily dosing of chantix to help cut out the last two cigarettes. Continue chantix.

## 2023-03-07 NOTE — Progress Notes (Signed)
Established Patient Office Visit  Subjective:  Patient ID: Haley Mcdonald, female    DOB: 10-02-1980  Age: 42 y.o. MRN: [id number]  CC:  Chief Complaint  Patient presents with   Establish Care   Labs Only    HPI Haley Mcdonald is here for a transition of care visit.  Prior provider was: Jarold Motto, FNP    Pt is with acute concerns.  Worsening anxiety. Hard to sleep, up and down all night.  Hard to turn off her mind, very overwhelmed. Always on the go.  Not impulsive. Is irritable and easily agitated.      chronic concerns:  Vitamin d def: still on high dose vitamin D.   ADHD: doing well on vyvnase. No chest pain or sob. Helps her to focus and stay on task at work otherwise without it is unable to.   Anxiety state: valium, but has not used very much. Same RX from 2020.   GERD: currently taking 40 mg omeprazole.  Has a poor on the go diet which likely contributes. When she saw the ENT she was told she had esophagitis.   Depression/anxiety: worsening, very overwhelmed. On prozac 40 mg once daily. Does use valium but very rarely. Did have a semi recent increase to 40 mg but states didn't notice much of a difference.   Migraines: increased recently with recent stress.  About 1-2 times a month which is more than was typical.  When they start she starts excedrin migraine , if that doesn't help she uses maxalt.   Smoking cessation: on chantix 1 mg twice daily, keeps forgetting to take the second dose, only once daily. Smoking 1-2 cigarettes daily due to increased stress.   Dm2: not taking anything, wegovy was not covered with insurance. Was on metformin but stopped it because she had a 'fear of pooping in public' so she was fearful if she took it this would happen. Recent eye exam but was not sure if it was for the diabetic exam.  Lab Results  Component Value Date   HGBA1C 6.7 (H) 02/04/2023   Vitamin b12 def: currently on b12 injections.  Not  taking b12 otc. Will get from pharmacy.       Past Medical History:  Diagnosis Date   ADD (attention deficit disorder) without hyperactivity    Anxiety    Asthma    as a child   Cervical dysplasia    Family history of breast cancer    Family history of lymphoma    Family history of melanoma    Headache    Migraine with aura    PONV (postoperative nausea and vomiting)    Pre-diabetes    gestational diabetes also   Pure hypercholesterolemia 12/20/2018    Past Surgical History:  Procedure Laterality Date   CERVICAL BIOPSY  W/ LOOP ELECTRODE EXCISION     CESAREAN SECTION     x 1   CYSTOSCOPY N/A 06/02/2018   Procedure: CYSTOSCOPY;  Surgeon: Romualdo Bolk, MD;  Location: [facility];  Service: Gynecology;  Laterality: N/A;   DILITATION & CURRETTAGE/HYSTROSCOPY WITH NOVASURE ABLATION N/A 04/01/2015   Procedure: DILATATION & CURETTAGE/HYSTEROSCOPY WITH NOVASURE ABLATION;  Surgeon: Christeen Douglas, MD;  Location: [facility];  Service: Gynecology;  Laterality: N/A;   ENDOMETRIAL ABLATION     TONSILLECTOMY     TOTAL LAPAROSCOPIC HYSTERECTOMY WITH SALPINGECTOMY Bilateral 06/02/2018   Procedure: TOTAL LAPAROSCOPIC HYSTERECTOMY WITH SALPINGECTOMY;  Surgeon: Romualdo Bolk, MD;  Location: [facility];  Service: Gynecology;  Laterality: Bilateral;  1.5 hours OR time per Dr Oscar La. Will need extended recovery bed.   TUBAL LIGATION      Family History  Problem Relation Age of Onset   Breast cancer Mother 31       "3x"   Lung cancer Father    Breast cancer Maternal Aunt 14   Lymphoma Paternal Grandmother 2   Colon cancer Maternal Grandfather 82   Melanoma Paternal Grandfather 62   Melanoma Cousin     Social History   Socioeconomic History   Marital status: Married    Spouse name: Not on file   Number of children: 3   Years of education: Not on file   Highest education level: Not on file  Occupational History    Employer: [employer]   Tobacco Use   Smoking status: Every Day    Current packs/day: 0.25    Average packs/day: 0.3 packs/day for 10.0 years (2.5 ttl pk-yrs)    Types: Cigarettes   Smokeless tobacco: Never  Vaping Use   Vaping status: Never Used  Substance and Sexual Activity   Alcohol use: Not Currently   Drug use: No   Sexual activity: Yes    Birth control/protection: Surgical    Comment: Hysterectomy  Other Topics Concern   Not on file  Social History Narrative   Not on file   Social Determinants of Health   Financial Resource Strain: Not on file  Food Insecurity: Not on file  Transportation Needs: Not on file  Physical Activity: Not on file  Stress: Not on file  Social Connections: Unknown (02/27/2018)   Social Connection and Isolation Panel [NHANES]    Frequency of Communication with Friends and Family: Not on file    Frequency of Social Gatherings with Friends and Family: Not on file    Attends Religious Services: Not on file    Active Member of Clubs or Organizations: Not on file    Attends Banker Meetings: Not on file    Marital Status: Married  Intimate Partner Violence: Unknown (11/23/2021)   Received from [facility]   HITS    Physically Hurt: Not on file    Insult or Talk Down To: Not on file    Threaten Physical Harm: Not on file    Scream or Curse: Not on file    Outpatient Medications Prior to Visit  Medication Sig Dispense Refill   albuterol (VENTOLIN HFA) 108 (90 Base) MCG/ACT inhaler Inhale 2 puffs into the lungs every 6 (six) hours as needed for wheezing or shortness of breath. 8 g 3   diazepam (VALIUM) 5 MG tablet Take 1 tablet (5 mg total) by mouth every 12 (twelve) hours as needed for anxiety. 30 tablet 1   FLUoxetine (PROZAC) 40 MG capsule Take 1 capsule (40 mg total) by mouth daily. 90 capsule 3   fluticasone (GNP FLUTICASONE PROPIONATE) 50 MCG/ACT nasal spray Place 1-2 sprays into both nostrils daily. 16 g 4   lisdexamfetamine (VYVANSE) 70 MG capsule  Take 1 capsule (70 mg total) by mouth daily before breakfast. 30 capsule 0   omeprazole (PRILOSEC) 20 MG capsule Take 1 capsule (20 mg total) by mouth daily. 30 capsule 0   omeprazole (PRILOSEC) 40 MG capsule Take 1 capsule (40 mg total) by mouth daily 30 minutes prior to evening meal. 30 capsule 3   rizatriptan (MAXALT) 10 MG tablet Take 1 tablet (10 mg total) by mouth as needed for migraine.  May repeat in 2 hours if needed 10 tablet 1   SYRINGE-NEEDLE, DISP, 3 ML (B-D 3CC LUER-LOK SYR 25GX1") 25G X 1" 3 ML MISC USE TO INJECT VIT B 12. 6 each 0   varenicline (CHANTIX CONTINUING MONTH PAK) 1 MG tablet Take 1 tablet (1 mg total) by mouth 2 (two) times daily. 180 tablet 1   Vitamin D, Ergocalciferol, (DRISDOL) 1.25 MG (50000 UNIT) CAPS capsule Take 1 capsule (50,000 Units total) by mouth every 7 (seven) days. 12 capsule 0   fluconazole (DIFLUCAN) 150 MG tablet Take one tablet by mouth daily for one dose, then repeat in three days if still with symptoms, and again repeat in three days if still with symptoms 3 tablet 0   Semaglutide-Weight Management (WEGOVY) 0.25 MG/0.5ML SOAJ Inject 0.25 mg into the skin once a week. 6 mL 1   No facility-administered medications prior to visit.    Allergies  Allergen Reactions   Penicillins Anaphylaxis, Shortness Of Breath and Other (See Comments)    Tachycardia  Has patient had a PCN reaction causing immediate rash, facial/tongue/throat swelling, SOB or lightheadedness with hypotension: Yes Has patient had a PCN reaction causing severe rash involving mucus membranes or skin necrosis: No Has patient had a PCN reaction that required hospitalization: No Has patient had a PCN reaction occurring within the last 10 years: Yes If all of the above answers are "NO", then may proceed with Cephalosporin use.    Trulicity [Dulaglutide] Nausea Only   Bupropion Rash    Blister   Ozempic (0.25 Or 0.5 Mg-Dose) [Semaglutide(0.25 Or 0.5mg -Dos)] Nausea Only and Other (See  Comments)    ROS: Pertinent symptoms negative unless otherwise noted in HPI      Objective:    Physical Exam Vitals reviewed.  Constitutional:      Appearance: Normal appearance.  Eyes:     General:        Right eye: No discharge.        Left eye: No discharge.     Conjunctiva/sclera: Conjunctivae normal.  Cardiovascular:     Rate and Rhythm: Normal rate and regular rhythm.  Pulmonary:     Effort: Pulmonary effort is normal. No respiratory distress.     Breath sounds: Normal breath sounds.  Musculoskeletal:        General: Normal range of motion.     Cervical back: Normal range of motion.  Neurological:     General: No focal deficit present.     Mental Status: She is alert and oriented to person, place, and time. Mental status is at baseline.  Psychiatric:        Attention and Perception: Attention normal.        Mood and Affect: Mood is anxious.        Behavior: Behavior normal. Behavior is cooperative.        Thought Content: Thought content normal.        Cognition and Memory: Cognition and memory normal.        Judgment: Judgment normal.     BP 124/82 (BP Location: Left Arm, Patient Position: Sitting, Cuff Size: Normal)   Pulse 81   Temp 97.8 F (36.6 C) (Temporal)   Ht 5' 3.5" (1.613 m)   Wt 190 lb (86.2 kg)   LMP 05/07/2018 (Exact Date)   SpO2 99%   BMI 33.13 kg/m  Wt Readings from Last 3 Encounters:  03/07/23 190 lb (86.2 kg)  02/04/23 190 lb (86.2 kg)  10/08/22 174 lb (78.9  kg)     Health Maintenance Due  Topic Date Due   FOOT EXAM  Never done   Diabetic kidney evaluation - Urine ACR  Never done   DTaP/Tdap/Td (2 - Td or Tdap) 03/07/2023    There are no preventive care reminders to display for this patient.  Lab Results  Component Value Date   TSH 3.80 02/04/2023   Lab Results  Component Value Date   WBC 8.1 10/09/2022   HGB 15.9 (H) 10/09/2022   HCT 47.9 (H) 10/09/2022   MCV 87.4 10/09/2022   PLT 244.0 10/09/2022   Lab Results   Component Value Date   NA 137 10/09/2022   K 3.8 10/09/2022   CO2 28 10/09/2022   GLUCOSE 149 (H) 10/09/2022   BUN 11 10/09/2022   CREATININE 0.74 10/09/2022   BILITOT 0.4 10/09/2022   ALKPHOS 99 10/09/2022   AST 15 10/09/2022   ALT 19 10/09/2022   PROT 7.0 10/09/2022   ALBUMIN 4.4 10/09/2022   CALCIUM 9.7 10/09/2022   ANIONGAP 12 05/29/2018   GFR 100.45 10/09/2022   Lab Results  Component Value Date   CHOL 186 10/09/2022   Lab Results  Component Value Date   HDL 37.90 (L) 10/09/2022   Lab Results  Component Value Date   LDLCALC 116 (H) 10/09/2022   Lab Results  Component Value Date   TRIG 161.0 (H) 10/09/2022   Lab Results  Component Value Date   CHOLHDL 5 10/09/2022   Lab Results  Component Value Date   HGBA1C 6.7 (H) 02/04/2023      Assessment & Plan:   Controlled type 2 diabetes mellitus without complication, without long-term current use of insulin (HCC) Assessment & Plan: Worsening.  Rx sent for trulicity, wegovy not covered pt willing to try again as only with slight nausea first time.  Work on diabetic diet and exercise as tolerated. Yearly foot exam, and annual eye exam.    Orders: -     Trulicity; Inject 0.75 mg into the skin once a week.  Dispense: 2 mL; Refill: 1 -     metFORMIN HCl; Take 1 tablet (500 mg total) by mouth 2 (two) times daily with a meal.  Dispense: 180 tablet; Refill: 3 -     Microalbumin / creatinine urine ratio -     FreeStyle Libre 3 Sensor; Place 1 sensor on the skin every 14 days. Use to check glucose continuously  Dispense: 6 each; Refill: 1  Gastroesophageal reflux disease with esophagitis without hemorrhage Assessment & Plan: On omeprazole 40 mg once daily Did d/w her long term PPI  use and ways to change up diet  Try to decrease and or avoid spicy foods, fried fatty foods, and also caffeine and chocolate as these can increase heartburn symptoms.     ADD (attention deficit disorder) without  hyperactivity Assessment & Plan: Continue vyvanse 70 mg   Orders: -     Ambulatory referral to Psychiatry  Anxiety and depression Assessment & Plan: Worsening Continue prozac 40 mg once daily pt declines to increase at this time.  Rx hydroxyxine trial prn  Valium prn   Orders: -     Ambulatory referral to Psychiatry  Anxiety state -     hydrOXYzine Pamoate; Take 1 capsule (25 mg total) by mouth every 8 (eight) hours as needed.  Dispense: 30 capsule; Refill: 0  At high risk for breast cancer Assessment & Plan: Read over note from oncology, high risk breast cancer specialist as well  as gyn  Ordering mammogram with tomo  Advised pt to call back to oncologist to determine if mri breast to be ordered, reconsider tamoxifen as pt continues to remain off and slowly titrate off cigarettes completely.   Orders: -     3D Screening Mammogram, Left and Right; Future  Screening mammogram for breast cancer -     3D Screening Mammogram, Left and Right; Future  Migraine without aura and without status migrainosus, not intractable Assessment & Plan: Stable Continue excedrin migraine and rizatriptan prn    Essential hypertension Assessment & Plan: Stable today    Tobacco dependence Assessment & Plan: Set alarm to remember twice daily dosing of chantix to help cut out the last two cigarettes. Continue chantix.      Meds ordered this encounter  Medications   Dulaglutide (TRULICITY) 0.75 MG/0.5ML SOPN    Sig: Inject 0.75 mg into the skin once a week.    Dispense:  2 mL    Refill:  1    Order Specific Question:   Supervising Provider    Answer:   BEDSOLE, AMY E [[id number]]   hydrOXYzine (VISTARIL) 25 MG capsule    Sig: Take 1 capsule (25 mg total) by mouth every 8 (eight) hours as needed.    Dispense:  30 capsule    Refill:  0    Order Specific Question:   Supervising Provider    Answer:   BEDSOLE, AMY E [[id number]]   metFORMIN (GLUCOPHAGE) 500 MG tablet    Sig: Take 1 tablet (500 mg  total) by mouth 2 (two) times daily with a meal.    Dispense:  180 tablet    Refill:  3    Order Specific Question:   Supervising Provider    Answer:   BEDSOLE, AMY E [[id number]]   Continuous Glucose Sensor (FREESTYLE LIBRE 3 SENSOR) MISC    Sig: Place 1 sensor on the skin every 14 days. Use to check glucose continuously    Dispense:  6 each    Refill:  1    Order Specific Question:   Supervising Provider    Answer:   Ermalene Searing, AMY E [[id number]]    Follow-up: Return in about 3 months (around 06/07/2023) for f/u diabetes.    Mort Sawyers, FNP

## 2023-03-07 NOTE — Assessment & Plan Note (Signed)
Read over note from oncology, high risk breast cancer specialist as well as gyn  Ordering mammogram with tomo  Advised pt to call back to oncologist to determine if mri breast to be ordered, reconsider tamoxifen as pt continues to remain off and slowly titrate off cigarettes completely.

## 2023-03-08 ENCOUNTER — Encounter: Payer: Self-pay | Admitting: Family

## 2023-03-08 ENCOUNTER — Other Ambulatory Visit: Payer: Self-pay | Admitting: Family

## 2023-03-08 DIAGNOSIS — Z9189 Other specified personal risk factors, not elsewhere classified: Secondary | ICD-10-CM

## 2023-03-08 DIAGNOSIS — Z1231 Encounter for screening mammogram for malignant neoplasm of breast: Secondary | ICD-10-CM

## 2023-03-11 NOTE — Progress Notes (Signed)
noted 

## 2023-03-12 ENCOUNTER — Encounter: Payer: Self-pay | Admitting: *Deleted

## 2023-03-13 ENCOUNTER — Telehealth: Payer: Self-pay | Admitting: Family

## 2023-03-13 NOTE — Telephone Encounter (Signed)
Dr. Armond Hang office called to let [facility] know the pt's insurance isn't in network with their office, so they cannot accept referral. Call back # [phone]

## 2023-03-18 ENCOUNTER — Encounter: Payer: Self-pay | Admitting: Family

## 2023-03-18 ENCOUNTER — Other Ambulatory Visit: Payer: Self-pay

## 2023-03-18 DIAGNOSIS — E782 Mixed hyperlipidemia: Secondary | ICD-10-CM

## 2023-03-18 DIAGNOSIS — E119 Type 2 diabetes mellitus without complications: Secondary | ICD-10-CM

## 2023-03-18 DIAGNOSIS — F988 Other specified behavioral and emotional disorders with onset usually occurring in childhood and adolescence: Secondary | ICD-10-CM

## 2023-03-18 MED ORDER — METFORMIN HCL ER 500 MG PO TB24
500.0000 mg | ORAL_TABLET | Freq: Every day | ORAL | 0 refills | Status: DC
  Filled 2023-03-18: qty 90, 90d supply, fill #0

## 2023-03-18 MED ORDER — LISDEXAMFETAMINE DIMESYLATE 70 MG PO CAPS
70.0000 mg | ORAL_CAPSULE | Freq: Every day | ORAL | 0 refills | Status: DC
Start: 2023-05-19 — End: 2023-07-01
  Filled 2023-05-24: qty 30, 30d supply, fill #0

## 2023-03-18 MED ORDER — LISDEXAMFETAMINE DIMESYLATE 70 MG PO CAPS
70.0000 mg | ORAL_CAPSULE | Freq: Every day | ORAL | 0 refills | Status: DC
Start: 2023-03-18 — End: 2023-04-23
  Filled 2023-03-18: qty 30, 30d supply, fill #0

## 2023-03-18 MED ORDER — LISDEXAMFETAMINE DIMESYLATE 70 MG PO CAPS
70.0000 mg | ORAL_CAPSULE | Freq: Every day | ORAL | 0 refills | Status: DC
Start: 2023-04-18 — End: 2023-07-01
  Filled 2023-04-23: qty 30, 30d supply, fill #0

## 2023-03-21 ENCOUNTER — Ambulatory Visit: Payer: 59 | Admitting: Dietician

## 2023-04-23 ENCOUNTER — Encounter: Payer: Self-pay | Admitting: Family

## 2023-04-23 ENCOUNTER — Other Ambulatory Visit: Payer: Self-pay

## 2023-04-23 DIAGNOSIS — R051 Acute cough: Secondary | ICD-10-CM

## 2023-04-23 MED ORDER — GUAIFENESIN-CODEINE 100-10 MG/5ML PO SYRP
5.0000 mL | ORAL_SOLUTION | Freq: Three times a day (TID) | ORAL | 0 refills | Status: DC | PRN
Start: 2023-04-23 — End: 2023-05-06
  Filled 2023-04-23: qty 75, 5d supply, fill #0

## 2023-04-24 ENCOUNTER — Encounter: Payer: Self-pay | Admitting: Family

## 2023-04-24 ENCOUNTER — Telehealth: Payer: Self-pay | Admitting: Family

## 2023-04-24 NOTE — Telephone Encounter (Signed)
Spoke with Haley Mcdonald at [facility]. She states that the pt did not disclose to them that she was diabetic when she came for her eye exam. I spoke with the pt and she states that she did disclose this information to them. Asked Haley Mcdonald to just fax over the notes they had for the pt's last eye exam. Will await records.

## 2023-04-24 NOTE — Telephone Encounter (Signed)
Katie from [facility]  called in and stated that the patient did not informed them that she was a diabetic and Dr Alfonse Alpers requested patient eye exam. Florentina Addison would like a call back if possible [phone])

## 2023-04-25 ENCOUNTER — Encounter: Payer: 59 | Attending: Family | Admitting: Dietician

## 2023-04-25 ENCOUNTER — Encounter: Payer: Self-pay | Admitting: Dietician

## 2023-04-25 VITALS — Ht 62.5 in | Wt 189.4 lb

## 2023-04-25 DIAGNOSIS — Z713 Dietary counseling and surveillance: Secondary | ICD-10-CM

## 2023-04-25 NOTE — Patient Instructions (Signed)
Continue decreasing sodas, great job so far!  Plan to eat a meal or snack every 3-5 hours throughout the day. Even a few bites helps.  Keep portions of foods especially starches controlled, 1 cup (fist size) or less. Make sure to include a protein food with each meal.

## 2023-04-25 NOTE — Progress Notes (Signed)
[employer] Employee "self referral" nutrition session: Start time: 0830   End time: 0930  Height: 5'2.5" Weight: 189.4lbs BMI: 34.09  Met with employee to discuss his/her nutritional concerns of: Type 2 Diabetes    Diet history:  Patient reports having gestational diabetes with both pregnancies, controlled with insulin. Had diabetes education at that time; about 20 years ago. Weight has increased about 30lbs in past year, after having hysterectomy. Patient has been working to decrease intake of sodas, was drinking multiple glasses of Dr. Reino Kent daily as her source of caffeine. She also is prone to migraines and states all artificial sweeteners are triggers.    Typical eating pattern: Breakfast: none; Dr Reino Kent, down from 20 oz to 12oz Snack: none Lunch: fast food ie burger and fries; frozen meal Snack: none Supper: protein + starch ie potatoes, bread + veg Snack: none Beverages: water, Dr Reino Kent 2 cans daily   Education topics covered during this visit: General nutrition/ Healthy eating Exercise Diabetes Weight Concerns   Educational resources provided: Designer, industrial/product with food lists General dietary guidelines for diabetes Sample menus and/or recipes Snacking handout   Additional Comments: Patient has knowledge of basic food groups and imprtance of limiting sugars, controlling carbs Instructed on meal planning with plate method, food lists; provided basic meal pan amounting to about 1400kcal, with 45% CHO, 25% pro, 30% fat. Discussed options for balanced meals and healthy snacks. Advised eating at regular intervals, keeping healthy options convenient to improve motivation to eat when appetite is low. Discussed setting alarms to eat if needed.   Plan: Follow goals outlined in Patient Instructions Return 06/12/23 at 9:00am

## 2023-04-30 ENCOUNTER — Encounter: Payer: Self-pay | Admitting: Family

## 2023-05-02 ENCOUNTER — Other Ambulatory Visit: Payer: Self-pay

## 2023-05-03 ENCOUNTER — Other Ambulatory Visit: Payer: Self-pay

## 2023-05-06 ENCOUNTER — Other Ambulatory Visit: Payer: Self-pay

## 2023-05-06 ENCOUNTER — Other Ambulatory Visit: Payer: Self-pay | Admitting: Family

## 2023-05-06 DIAGNOSIS — T7840XA Allergy, unspecified, initial encounter: Secondary | ICD-10-CM

## 2023-05-06 DIAGNOSIS — H60312 Diffuse otitis externa, left ear: Secondary | ICD-10-CM

## 2023-05-06 MED ORDER — PREDNISONE 10 MG (21) PO TBPK
ORAL_TABLET | ORAL | 0 refills | Status: DC
Start: 2023-05-06 — End: 2023-07-05
  Filled 2023-05-06: qty 21, 6d supply, fill #0

## 2023-05-06 MED ORDER — CIPROFLOXACIN-DEXAMETHASONE 0.3-0.1 % OT SUSP
4.0000 [drp] | Freq: Two times a day (BID) | OTIC | 0 refills | Status: DC
Start: 2023-05-06 — End: 2023-05-06
  Filled 2023-05-06: qty 7.5, 7d supply, fill #0

## 2023-05-06 MED ORDER — NEOMYCIN-POLYMYXIN-HC 3.5-10000-1 OT SOLN
4.0000 [drp] | Freq: Four times a day (QID) | OTIC | 0 refills | Status: AC
Start: 2023-05-06 — End: 2023-05-17
  Filled 2023-05-06: qty 10, 10d supply, fill #0

## 2023-05-07 ENCOUNTER — Other Ambulatory Visit: Payer: Self-pay

## 2023-05-21 ENCOUNTER — Other Ambulatory Visit: Payer: Self-pay | Admitting: Family

## 2023-05-21 DIAGNOSIS — Z1231 Encounter for screening mammogram for malignant neoplasm of breast: Secondary | ICD-10-CM

## 2023-05-24 ENCOUNTER — Other Ambulatory Visit: Payer: Self-pay

## 2023-05-30 ENCOUNTER — Ambulatory Visit
Admission: RE | Admit: 2023-05-30 | Discharge: 2023-05-30 | Disposition: A | Discharge status: Home / Self Care | Payer: 59 | Source: Ambulatory Visit | Attending: Family | Admitting: Family

## 2023-05-30 DIAGNOSIS — Z1231 Encounter for screening mammogram for malignant neoplasm of breast: Secondary | ICD-10-CM | POA: Diagnosis not present

## 2023-06-04 ENCOUNTER — Other Ambulatory Visit (HOSPITAL_COMMUNITY): Payer: Self-pay

## 2023-06-12 ENCOUNTER — Ambulatory Visit: Payer: 59 | Admitting: Dietician

## 2023-06-13 ENCOUNTER — Encounter: Payer: Self-pay | Admitting: Family

## 2023-06-13 ENCOUNTER — Other Ambulatory Visit: Payer: Self-pay | Admitting: Family

## 2023-06-13 ENCOUNTER — Other Ambulatory Visit: Payer: Self-pay

## 2023-06-13 DIAGNOSIS — I10 Essential (primary) hypertension: Secondary | ICD-10-CM

## 2023-06-13 MED ORDER — TRIAMTERENE-HCTZ 37.5-25 MG PO TABS
1.0000 | ORAL_TABLET | Freq: Every day | ORAL | 3 refills | Status: DC
  Filled 2023-06-13: qty 90, 90d supply, fill #0
  Filled 2023-10-11: qty 90, 90d supply, fill #1
  Filled 2024-01-07 – 2024-02-25 (×2): qty 90, 90d supply, fill #2

## 2023-06-19 ENCOUNTER — Other Ambulatory Visit: Payer: Self-pay

## 2023-06-29 ENCOUNTER — Other Ambulatory Visit: Payer: Self-pay | Admitting: Family

## 2023-06-29 ENCOUNTER — Other Ambulatory Visit: Payer: Self-pay

## 2023-06-29 DIAGNOSIS — E119 Type 2 diabetes mellitus without complications: Secondary | ICD-10-CM

## 2023-06-29 DIAGNOSIS — F988 Other specified behavioral and emotional disorders with onset usually occurring in childhood and adolescence: Secondary | ICD-10-CM

## 2023-07-01 ENCOUNTER — Other Ambulatory Visit: Payer: Self-pay | Admitting: Family

## 2023-07-01 ENCOUNTER — Other Ambulatory Visit: Payer: Self-pay

## 2023-07-01 ENCOUNTER — Encounter: Payer: Self-pay | Admitting: Family

## 2023-07-01 DIAGNOSIS — F988 Other specified behavioral and emotional disorders with onset usually occurring in childhood and adolescence: Secondary | ICD-10-CM

## 2023-07-01 DIAGNOSIS — E119 Type 2 diabetes mellitus without complications: Secondary | ICD-10-CM

## 2023-07-01 MED ORDER — LISDEXAMFETAMINE DIMESYLATE 70 MG PO CAPS
70.0000 mg | ORAL_CAPSULE | Freq: Every day | ORAL | 0 refills | Status: DC
  Filled 2023-07-01: qty 30, 30d supply, fill #0

## 2023-07-01 MED FILL — Metformin HCl Tab ER 24HR 500 MG: ORAL | 90 days supply | Qty: 90 | Fill #0 | Status: AC

## 2023-07-02 ENCOUNTER — Other Ambulatory Visit: Payer: Self-pay

## 2023-07-05 ENCOUNTER — Other Ambulatory Visit: Payer: Self-pay | Admitting: *Deleted

## 2023-07-05 ENCOUNTER — Ambulatory Visit (INDEPENDENT_AMBULATORY_CARE_PROVIDER_SITE_OTHER): Payer: 59 | Admitting: Family

## 2023-07-05 ENCOUNTER — Other Ambulatory Visit: Payer: Self-pay

## 2023-07-05 ENCOUNTER — Encounter: Payer: Self-pay | Admitting: Family

## 2023-07-05 ENCOUNTER — Ambulatory Visit
Admission: RE | Admit: 2023-07-05 | Discharge: 2023-07-05 | Disposition: A | Discharge status: Home / Self Care | Payer: 59 | Source: Ambulatory Visit | Attending: Family

## 2023-07-05 VITALS — BP 128/96 | HR 99 | Ht 62.5 in | Wt 181.0 lb

## 2023-07-05 DIAGNOSIS — R109 Unspecified abdominal pain: Secondary | ICD-10-CM | POA: Insufficient documentation

## 2023-07-05 DIAGNOSIS — R808 Other proteinuria: Secondary | ICD-10-CM | POA: Diagnosis not present

## 2023-07-05 DIAGNOSIS — E559 Vitamin D deficiency, unspecified: Secondary | ICD-10-CM

## 2023-07-05 DIAGNOSIS — R1030 Lower abdominal pain, unspecified: Secondary | ICD-10-CM

## 2023-07-05 DIAGNOSIS — R1031 Right lower quadrant pain: Secondary | ICD-10-CM | POA: Insufficient documentation

## 2023-07-05 DIAGNOSIS — R3129 Other microscopic hematuria: Secondary | ICD-10-CM | POA: Diagnosis not present

## 2023-07-05 LAB — CBC WITH DIFFERENTIAL/PLATELET
Basophils Absolute: 0 10*3/uL (ref 0.0–0.1)
Basophils Relative: 0.4 % (ref 0.0–3.0)
Eosinophils Absolute: 0.1 10*3/uL (ref 0.0–0.7)
Eosinophils Relative: 0.9 % (ref 0.0–5.0)
HCT: 51.3 % — ABNORMAL HIGH (ref 36.0–46.0)
Hemoglobin: 16.7 g/dL — ABNORMAL HIGH (ref 12.0–15.0)
Lymphocytes Relative: 32.3 % (ref 12.0–46.0)
Lymphs Abs: 3.6 10*3/uL (ref 0.7–4.0)
MCHC: 32.5 g/dL (ref 30.0–36.0)
MCV: 88.3 fL (ref 78.0–100.0)
Monocytes Absolute: 0.8 10*3/uL (ref 0.1–1.0)
Monocytes Relative: 6.8 % (ref 3.0–12.0)
Neutro Abs: 6.7 10*3/uL (ref 1.4–7.7)
Neutrophils Relative %: 59.6 % (ref 43.0–77.0)
Platelets: 289 10*3/uL (ref 150.0–400.0)
RBC: 5.81 Mil/uL — ABNORMAL HIGH (ref 3.87–5.11)
RDW: 14.8 % (ref 11.5–15.5)
WBC: 11.2 10*3/uL — ABNORMAL HIGH (ref 4.0–10.5)

## 2023-07-05 LAB — BASIC METABOLIC PANEL
BUN: 17 mg/dL (ref 6–23)
CO2: 30 meq/L (ref 19–32)
Calcium: 10.3 mg/dL (ref 8.4–10.5)
Chloride: 94 meq/L — ABNORMAL LOW (ref 96–112)
Creatinine, Ser: 1.07 mg/dL (ref 0.40–1.20)
GFR: 64.2 mL/min (ref >60.00)
Glucose, Bld: 116 mg/dL — ABNORMAL HIGH (ref 70–99)
Potassium: 3.5 meq/L (ref 3.5–5.1)
Sodium: 134 meq/L — ABNORMAL LOW (ref 135–145)

## 2023-07-05 LAB — POC URINALSYSI DIPSTICK (AUTOMATED)
Bilirubin, UA: NEGATIVE
Blood, UA: 10
Glucose, UA: NEGATIVE
Ketones, UA: 5
Leukocytes, UA: NEGATIVE
Nitrite, UA: POSITIVE
Protein, UA: POSITIVE — AB
Spec Grav, UA: 1.015 (ref 1.010–1.025)
Urobilinogen, UA: 0.2 U/dL
pH, UA: 6 (ref 5.0–8.0)

## 2023-07-05 MED ORDER — CIPROFLOXACIN HCL 500 MG PO TABS
500.0000 mg | ORAL_TABLET | Freq: Two times a day (BID) | ORAL | 0 refills | Status: AC
  Filled 2023-07-05: qty 6, 3d supply, fill #0

## 2023-07-05 MED ORDER — TAMSULOSIN HCL 0.4 MG PO CAPS
0.4000 mg | ORAL_CAPSULE | Freq: Every day | ORAL | 0 refills | Status: DC
  Filled 2023-07-05: qty 30, 30d supply, fill #0

## 2023-07-05 NOTE — Progress Notes (Signed)
Established Patient Office Visit  Subjective:   Patient ID: Haley Mcdonald, female    DOB: 10-04-1980  Age: 42 y.o. MRN: [id number]  CC:  Chief Complaint  Patient presents with   Acute Visit    Abdominal pain and back pain x2 days.    HPI: Haley Mcdonald is a 42 y.o. female presenting on 07/05/2023 for Acute Visit (Abdominal pain and back pain x2 days.)  Started with sharp pain yesterday am that starts around her middle of her back, and then started to worsen throughout the day. Not worse with movement. The sharp stabbing pain comes and goes, when sharp the pain is a ten out of ten. While she was driving yesterday the pain was so bad she had to pull over because the pain was causing her to have spots in her vision.   No change in bowel movements, no urinary symptoms to include dysuria, frequency   No chance of pregnancy due to hysterectomy, still with ovaries in place.  No known injury to back. She does have h/o mild disc bulge L5-S1 but states this is very different than from the back pain.         ROS: Negative unless specifically indicated above in HPI.   Relevant past medical history reviewed and updated as indicated.   Allergies and medications reviewed and updated.   Current Outpatient Medications:    albuterol (VENTOLIN HFA) 108 (90 Base) MCG/ACT inhaler, Inhale 2 puffs into the lungs every 6 (six) hours as needed for wheezing or shortness of breath., Disp: 8 g, Rfl: 3   Cholecalciferol (VITAMIN D) 50 MCG (2000 UT) CAPS, Take 1 capsule by mouth daily., Disp: , Rfl:    Continuous Glucose Sensor (FREESTYLE LIBRE 3 SENSOR) MISC, Place 1 sensor on the skin every 14 days. Use to check glucose continuously, Disp: 6 each, Rfl: 1   FLUoxetine (PROZAC) 40 MG capsule, Take 1 capsule (40 mg total) by mouth daily., Disp: 90 capsule, Rfl: 3   fluticasone (GNP FLUTICASONE PROPIONATE) 50 MCG/ACT nasal spray, Place 1-2 sprays into both nostrils daily., Disp: 16 g,  Rfl: 4   lisdexamfetamine (VYVANSE) 70 MG capsule, Take 1 capsule (70 mg total) by mouth daily before breakfast., Disp: 30 capsule, Rfl: 0   metFORMIN (GLUCOPHAGE-XR) 500 MG 24 hr tablet, Take 1 tablet (500 mg total) by mouth daily with breakfast., Disp: 90 tablet, Rfl: 0   rizatriptan (MAXALT) 10 MG tablet, Take 1 tablet (10 mg total) by mouth as needed for migraine. May repeat in 2 hours if needed, Disp: 10 tablet, Rfl: 1   SYRINGE-NEEDLE, DISP, 3 ML (B-D 3CC LUER-LOK SYR 25GX1") 25G X 1" 3 ML MISC, USE TO INJECT VIT B 12., Disp: 6 each, Rfl: 0   triamterene-hydrochlorothiazide (MAXZIDE-25) 37.5-25 MG tablet, Take 1 tablet by mouth daily., Disp: 90 tablet, Rfl: 3   Dulaglutide (TRULICITY) 0.75 MG/0.5ML SOPN, Inject 0.75 mg into the skin once a week. (Patient not taking: Reported on 07/05/2023), Disp: 2 mL, Rfl: 1  Allergies  Allergen Reactions   Penicillins Anaphylaxis, Shortness Of Breath and Other (See Comments)    Tachycardia  Has patient had a PCN reaction causing immediate rash, facial/tongue/throat swelling, SOB or lightheadedness with hypotension: Yes Has patient had a PCN reaction causing severe rash involving mucus membranes or skin necrosis: No Has patient had a PCN reaction that required hospitalization: No Has patient had a PCN reaction occurring within the last 10 years: Yes If all of the above answers  are "NO", then may proceed with Cephalosporin use.    Trulicity [Dulaglutide] Nausea Only   Bupropion Rash    Blister   Ciprodex [Ciprofloxacin-Dexamethasone] Other (See Comments)    Swelling of ear with treating EO   Ozempic (0.25 Or 0.5 Mg-Dose) [Semaglutide(0.25 Or 0.5mg -Dos)] Nausea Only and Other (See Comments)    Objective:   BP (!) 128/96   Pulse 99   Ht 5' 2.5" (1.588 m)   Wt 181 lb (82.1 kg)   LMP 05/07/2018 (Exact Date)   SpO2 100%   BMI 32.58 kg/m    Physical Exam Constitutional:      General: She is not in acute distress.    Appearance: Normal  appearance. She is normal weight. She is not ill-appearing, toxic-appearing or diaphoretic.  Cardiovascular:     Rate and Rhythm: Normal rate.  Pulmonary:     Effort: Pulmonary effort is normal.  Abdominal:     General: Abdomen is flat. Bowel sounds are normal.     Palpations: Abdomen is soft.     Tenderness: There is abdominal tenderness in the right lower quadrant. There is no right CVA tenderness, left CVA tenderness, guarding or rebound. Positive signs include McBurney's sign. Negative signs include Rovsing's sign and psoas sign.  Neurological:     General: No focal deficit present.     Mental Status: She is alert and oriented to person, place, and time. Mental status is at baseline.     Motor: No weakness.  Psychiatric:        Mood and Affect: Mood normal.        Behavior: Behavior normal.        Thought Content: Thought content normal.        Judgment: Judgment normal.     Assessment & Plan:  Other proteinuria  Acute left flank pain Assessment & Plan: Suspected kidney stone  Urine dip today with protein  Urinalysis with culture pending.  Low suspicion for UTI  Gave pt red flag symptoms and when to seek urgent care.  Advised to increase water intake, lay off sodas.   Orders: -     POCT Urinalysis Dipstick (Automated) -     CT ABDOMEN PELVIS WO CONTRAST; Future -     CBC with Differential/Platelet -     Basic metabolic panel  Lower abdominal pain -     CT ABDOMEN PELVIS WO CONTRAST; Future  Right lower quadrant abdominal pain Assessment & Plan: Ddx appendicitis  Low likelihood of obstruction as bowel normal  Could be radiation of pain from possible kidney stone.  Ct abd pelvis pending cbc ordered to r/o leukocytosis  Discussed red flag concerns such as worsening abdominal pain, fever, vomiting to go to ER and or seek more urgent care.   Orders: -     CBC with Differential/Platelet -     Basic metabolic panel  Vitamin D deficiency     Follow up plan:  Return in about 2 weeks (around 07/19/2023) for f/u blood pressure.  Mort Sawyers, FNP

## 2023-07-05 NOTE — Assessment & Plan Note (Signed)
Suspected kidney stone  Urine dip today with protein  Urinalysis with culture pending.  Low suspicion for UTI  Gave pt red flag symptoms and when to seek urgent care.  Advised to increase water intake, lay off sodas.

## 2023-07-05 NOTE — Assessment & Plan Note (Signed)
Ddx appendicitis  Low likelihood of obstruction as bowel normal  Could be radiation of pain from possible kidney stone.  Ct abd pelvis pending cbc ordered to r/o leukocytosis  Discussed red flag concerns such as worsening abdominal pain, fever, vomiting to go to ER and or seek more urgent care.

## 2023-08-05 ENCOUNTER — Other Ambulatory Visit: Payer: Self-pay | Admitting: Family

## 2023-08-05 ENCOUNTER — Other Ambulatory Visit: Payer: Self-pay

## 2023-08-05 DIAGNOSIS — F988 Other specified behavioral and emotional disorders with onset usually occurring in childhood and adolescence: Secondary | ICD-10-CM

## 2023-08-05 MED ORDER — LISDEXAMFETAMINE DIMESYLATE 70 MG PO CAPS
70.0000 mg | ORAL_CAPSULE | Freq: Every day | ORAL | 0 refills | Status: DC
  Filled 2023-08-05: qty 30, 30d supply, fill #0

## 2023-08-09 ENCOUNTER — Ambulatory Visit: Payer: 59 | Admitting: General Practice

## 2023-08-09 ENCOUNTER — Encounter: Payer: Self-pay | Admitting: General Practice

## 2023-08-09 ENCOUNTER — Other Ambulatory Visit: Payer: Self-pay

## 2023-08-09 VITALS — BP 128/82 | HR 78 | Ht 62.5 in | Wt 184.0 lb

## 2023-08-09 DIAGNOSIS — J019 Acute sinusitis, unspecified: Secondary | ICD-10-CM | POA: Insufficient documentation

## 2023-08-09 MED ORDER — AMOXICILLIN-POT CLAVULANATE 875-125 MG PO TABS
1.0000 | ORAL_TABLET | Freq: Two times a day (BID) | ORAL | 0 refills | Status: AC
Start: 2023-08-09 — End: 2023-08-16
  Filled 2023-08-09: qty 14, 7d supply, fill #0

## 2023-08-09 MED ORDER — ALBUTEROL SULFATE HFA 108 (90 BASE) MCG/ACT IN AERS
2.0000 | INHALATION_SPRAY | Freq: Four times a day (QID) | RESPIRATORY_TRACT | 3 refills | Status: DC | PRN
  Filled 2023-08-09: qty 6.7, 25d supply, fill #0

## 2023-08-09 NOTE — Patient Instructions (Signed)
Start Augmentin antibiotics. Take 1 tablet by mouth twice daily for 7 days.  You can try a few things over the counter to help with your symptoms including:  Cough: Delsym or Robitussin (get the off brand, works just as well) Chest Congestion: Mucinex (plain) Nasal Congestion/Ear Pressure/Sinus Pressure: Try using Flonase (fluticasone) nasal spray. Instill 1 spray in each nostril twice daily. This can be purchased over the counter. Body aches, fevers, headache: Ibuprofen (not to exceed 2400 mg in 24 hours) or Acetaminophen-Tylenol (not to exceed 3000 mg in 24 hours) Runny Nose/Throat Drainage/Sneezing/Itchy or Watery Eyes: An antihistamine such as Zyrtec, Claritin, Xyzal, Allegra  Albuterol refill has been sent.   Please update if you are not feeling better or worsen.

## 2023-08-09 NOTE — Assessment & Plan Note (Signed)
Symptoms suggestive of sinusitis frontal and maxillary.   Start Augmentin antibiotics. Take 1 tablet by mouth twice daily for 7 days. Albuterol refills sent.   Please update if your symptoms worsen or fail to improve.

## 2023-08-09 NOTE — Progress Notes (Signed)
Established Patient Office Visit  Subjective   Patient ID: Haley Mcdonald, female    DOB: 1981-03-01  Age: 42 y.o. MRN: [id number]  Chief Complaint  Patient presents with   Sinus Problem    HPI  Haley Mcdonald is a 42 year old female, patient of Mort Sawyers, FNP, presents today for an acute visit to discuss sinus problem.   Symptom onset yesterday (12/15). Cough, sore throat, chest tightness. She has a history of asthma. Denies any fever, chills. She has been tested for strep, covid, and flu all were negative.   She has tried Nyquil and did not give her much relief.  She has had the flu shot this season.  Patient Active Problem List   Diagnosis Date Noted   Acute non-recurrent sinusitis 08/09/2023   Other proteinuria 07/05/2023   Gastroesophageal reflux disease with esophagitis without hemorrhage 03/07/2023   Controlled type 2 diabetes mellitus without complication, without long-term current use of insulin (HCC) 02/04/2023   Cigarette smoker 07/26/2022   Reactive airway disease 05/18/2022   Allergic rhinitis 05/18/2022   Essential hypertension 09/23/2019   B12 deficiency 09/23/2019   Migraine without aura and without status migrainosus, not intractable 09/23/2019   Anxiety and depression 07/08/2019   At high risk for breast cancer 07/06/2019   Elevated hemoglobin (HCC) 12/20/2018   Vitamin D deficiency 12/20/2018   Mixed hyperlipidemia 12/20/2018   Tobacco dependence 06/12/2018   Status post laparoscopic hysterectomy 06/02/2018   Family history of breast cancer    Family history of melanoma    Family history of lymphoma    Cervical dysplasia    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   Past Medical History:  Diagnosis Date   ADD (attention deficit disorder) without hyperactivity    Anxiety    Asthma    as a child   Cervical dysplasia    Family history of breast cancer    Family history of lymphoma    Family history of  melanoma    Headache    Migraine with aura    PONV (postoperative nausea and vomiting)    Pre-diabetes    gestational diabetes also   Pure hypercholesterolemia 12/20/2018   Allergies  Allergen Reactions   Penicillins Anaphylaxis, Shortness Of Breath and Other (See Comments)    Tachycardia  Has patient had a PCN reaction causing immediate rash, facial/tongue/throat swelling, SOB or lightheadedness with hypotension: Yes Has patient had a PCN reaction causing severe rash involving mucus membranes or skin necrosis: No Has patient had a PCN reaction that required hospitalization: No Has patient had a PCN reaction occurring within the last 10 years: Yes If all of the above answers are "NO", then may proceed with Cephalosporin use.    Trulicity [Dulaglutide] Nausea Only   Bupropion Rash    Blister   Ciprodex [Ciprofloxacin-Dexamethasone] Other (See Comments)    Swelling of ear with treating EO   Ozempic (0.25 Or 0.5 Mg-Dose) [Semaglutide(0.25 Or 0.5mg -Dos)] Nausea Only and Other (See Comments)         03/07/2023    8:24 AM 10/08/2022    8:17 AM 07/08/2019    3:29 PM  Depression screen PHQ 2/9  Decreased Interest 1 2 2   Down, Depressed, Hopeless 1 2 2   PHQ - 2 Score 2 4 4   Altered sleeping 3 3 1   Tired, decreased energy 3 3 1   Change in appetite 3 1 0  Feeling bad or failure about yourself  1 2 0  Trouble  concentrating 3 3 0  Moving slowly or fidgety/restless 0 0 0  Suicidal thoughts 0 0 0  PHQ-9 Score 15 16 6   Difficult doing work/chores Somewhat difficult Somewhat difficult Somewhat difficult       03/07/2023    8:24 AM 10/08/2022    8:18 AM  GAD 7 : Generalized Anxiety Score  Nervous, Anxious, on Edge 1 1  Control/stop worrying 2 1  Worry too much - different things 2 1  Trouble relaxing 3 2  Restless 1 2  Easily annoyed or irritable 1 3  Afraid - awful might happen 0 0  Total GAD 7 Score 10 10  Anxiety Difficulty Somewhat difficult Somewhat difficult      Review  of Systems  Constitutional:  Negative for chills and fever.  HENT:  Positive for congestion, sinus pain and sore throat. Negative for ear pain.   Respiratory:  Positive for cough. Negative for shortness of breath.        Chest tightness  Cardiovascular:  Negative for chest pain.  Neurological:  Negative for dizziness and headaches.      Objective:     BP 128/82   Pulse 78   Ht 5' 2.5" (1.588 m)   Wt 184 lb (83.5 kg)   LMP 05/07/2018 (Exact Date)   SpO2 98%   BMI 33.12 kg/m  BP Readings from Last 3 Encounters:  08/09/23 128/82  07/05/23 (!) 128/96  03/07/23 124/82   Wt Readings from Last 3 Encounters:  08/09/23 184 lb (83.5 kg)  07/05/23 181 lb (82.1 kg)  04/25/23 189 lb 6.4 oz (85.9 kg)      Physical Exam Vitals and nursing note reviewed.  Constitutional:      Appearance: Normal appearance.  HENT:     Right Ear: Tympanic membrane, ear canal and external ear normal.     Left Ear: Tympanic membrane, ear canal and external ear normal.  Eyes:     Conjunctiva/sclera: Conjunctivae normal.  Cardiovascular:     Rate and Rhythm: Normal rate and regular rhythm.     Pulses: Normal pulses.     Heart sounds: Normal heart sounds.  Pulmonary:     Effort: Pulmonary effort is normal.     Breath sounds: Normal breath sounds.  Neurological:     Mental Status: She is alert and oriented to person, place, and time.  Psychiatric:        Mood and Affect: Mood normal.        Behavior: Behavior normal.        Thought Content: Thought content normal.        Judgment: Judgment normal.      No results found for any visits on 08/09/23.     The 10-year ASCVD risk score (Arnett DK, et al., 2019) is: 2.9%    Assessment & Plan:  Acute non-recurrent sinusitis, unspecified location Assessment & Plan: Symptoms suggestive of sinusitis frontal and maxillary.   Start Augmentin antibiotics. Take 1 tablet by mouth twice daily for 7 days. Albuterol refills sent.   Please update if  your symptoms worsen or fail to improve.   Orders: -     Amoxicillin-Pot Clavulanate; Take 1 tablet by mouth 2 (two) times daily for 7 days.  Dispense: 14 tablet; Refill: 0  Other orders -     Albuterol Sulfate HFA; Inhale 2 puffs into the lungs every 6 (six) hours as needed for wheezing or shortness of breath.  Dispense: 6.7 g; Refill: 3  Return if symptoms worsen or fail to improve.    Modesto Charon, NP

## 2023-08-28 ENCOUNTER — Other Ambulatory Visit (HOSPITAL_COMMUNITY): Payer: Self-pay

## 2023-08-28 ENCOUNTER — Other Ambulatory Visit: Payer: Self-pay | Admitting: Family

## 2023-08-28 DIAGNOSIS — E119 Type 2 diabetes mellitus without complications: Secondary | ICD-10-CM

## 2023-08-28 MED ORDER — METFORMIN HCL ER 500 MG PO TB24
500.0000 mg | ORAL_TABLET | Freq: Every day | ORAL | 1 refills | Status: DC
  Filled 2023-08-28: qty 90, 90d supply, fill #0

## 2023-08-29 ENCOUNTER — Other Ambulatory Visit (HOSPITAL_COMMUNITY): Payer: Self-pay

## 2023-08-29 ENCOUNTER — Other Ambulatory Visit: Payer: Self-pay

## 2023-08-30 ENCOUNTER — Other Ambulatory Visit: Payer: Self-pay

## 2023-09-03 ENCOUNTER — Ambulatory Visit: Payer: Commercial Managed Care - PPO | Admitting: Family

## 2023-09-03 ENCOUNTER — Other Ambulatory Visit: Payer: Self-pay

## 2023-09-03 ENCOUNTER — Encounter: Payer: Self-pay | Admitting: Family

## 2023-09-03 VITALS — BP 125/98 | HR 78 | Temp 98.3°F | Ht 62.0 in | Wt 180.0 lb

## 2023-09-03 DIAGNOSIS — F419 Anxiety disorder, unspecified: Secondary | ICD-10-CM

## 2023-09-03 DIAGNOSIS — F32A Depression, unspecified: Secondary | ICD-10-CM

## 2023-09-03 DIAGNOSIS — Z79899 Other long term (current) drug therapy: Secondary | ICD-10-CM

## 2023-09-03 DIAGNOSIS — Z9189 Other specified personal risk factors, not elsewhere classified: Secondary | ICD-10-CM

## 2023-09-03 DIAGNOSIS — B351 Tinea unguium: Secondary | ICD-10-CM

## 2023-09-03 DIAGNOSIS — E871 Hypo-osmolality and hyponatremia: Secondary | ICD-10-CM

## 2023-09-03 DIAGNOSIS — I10 Essential (primary) hypertension: Secondary | ICD-10-CM

## 2023-09-03 DIAGNOSIS — G43009 Migraine without aura, not intractable, without status migrainosus: Secondary | ICD-10-CM

## 2023-09-03 DIAGNOSIS — Z7985 Long-term (current) use of injectable non-insulin antidiabetic drugs: Secondary | ICD-10-CM

## 2023-09-03 DIAGNOSIS — E538 Deficiency of other specified B group vitamins: Secondary | ICD-10-CM | POA: Diagnosis not present

## 2023-09-03 DIAGNOSIS — E559 Vitamin D deficiency, unspecified: Secondary | ICD-10-CM

## 2023-09-03 DIAGNOSIS — D582 Other hemoglobinopathies: Secondary | ICD-10-CM

## 2023-09-03 DIAGNOSIS — E119 Type 2 diabetes mellitus without complications: Secondary | ICD-10-CM

## 2023-09-03 DIAGNOSIS — F988 Other specified behavioral and emotional disorders with onset usually occurring in childhood and adolescence: Secondary | ICD-10-CM

## 2023-09-03 DIAGNOSIS — Z808 Family history of malignant neoplasm of other organs or systems: Secondary | ICD-10-CM

## 2023-09-03 DIAGNOSIS — E782 Mixed hyperlipidemia: Secondary | ICD-10-CM

## 2023-09-03 DIAGNOSIS — Z87891 Personal history of nicotine dependence: Secondary | ICD-10-CM

## 2023-09-03 MED ORDER — CICLOPIROX 8 % EX SOLN
Freq: Every day | CUTANEOUS | 0 refills | Status: DC
  Filled 2023-09-03: qty 6.6, 30d supply, fill #0

## 2023-09-03 MED ORDER — PRAVASTATIN SODIUM 10 MG PO TABS
10.0000 mg | ORAL_TABLET | Freq: Every day | ORAL | 3 refills | Status: DC
  Filled 2023-09-03: qty 90, 90d supply, fill #0
  Filled 2023-11-15 – 2023-12-26 (×3): qty 90, 90d supply, fill #1

## 2023-09-03 NOTE — Assessment & Plan Note (Signed)
 UDS today and non opioid contract in office.  PDMP reviewed. Refills sent for vyvanse 70 mg once daily

## 2023-09-03 NOTE — Assessment & Plan Note (Signed)
 Repeat b12 and cbc today pending results.

## 2023-09-03 NOTE — Assessment & Plan Note (Signed)
 Discussed with pt following up with dermatology for high risk she will make appointment

## 2023-09-03 NOTE — Assessment & Plan Note (Signed)
 High diastolic in office today  Pt to go home and check blood pressures daily and report back via message x one week  Continue maxide as prescribed.

## 2023-09-03 NOTE — Assessment & Plan Note (Addendum)
 Ordered lipid panel, pending results. Work on low cholesterol diet and exercise as tolerated Starting pravastatin  Pt to repeat lipid panel x 3 months

## 2023-09-03 NOTE — Assessment & Plan Note (Signed)
 Start penlac nail solution

## 2023-09-03 NOTE — Assessment & Plan Note (Signed)
 Doing well, intermittent.  Continue prozac 40 mg once daily. Consult with psychiatry and consider therapy

## 2023-09-03 NOTE — Assessment & Plan Note (Signed)
 Stressed daily vitamin D Ordering and pending results.

## 2023-09-03 NOTE — Progress Notes (Signed)
 Established Patient Office Visit  Subjective:      CC:  Chief Complaint  Patient presents with   Medical Management of Chronic Issues    HPI: Haley Mcdonald is a 43 y.o. female presenting on 09/03/2023 for Medical Management of Chronic Issues . DM2: currently on Trulicity  0.75 mg once weekly. Also taking metformin  XR  once daily. She was having constipation but in the last week has improved. She is starting to try to get her to eat healthier throughout the day and not skip meals.   ADD: on vyvanse  70 mg once daily. She states this is helping her to focus and stay on task.   Depression/anxiety: has started to take prozac  more regularly and states this has been helpful. Not yet seeing a psychiatrist, but she is planning to call and make an appointment.   Vitamin d  def: not an everyday taker.   HTN: blood pressure elevated today, doesn't check at home. She is currently on maxide 37.5/25. she states she has noticed feet and hands are not as swollen since she started taking it.   Low serum B12, taking B12 1000 once daily.        Social history:  Relevant past medical, surgical, family and social history reviewed and updated as indicated. Interim medical history since our last visit reviewed.  Allergies and medications reviewed and updated.  DATA REVIEWED: CHART IN EPIC     ROS: Negative unless specifically indicated above in HPI.    Current Outpatient Medications:    albuterol  (VENTOLIN  HFA) 108 (90 Base) MCG/ACT inhaler, Inhale 2 puffs into the lungs every 6 (six) hours as needed for wheezing or shortness of breath., Disp: 6.7 g, Rfl: 3   Cholecalciferol  (VITAMIN D ) 50 MCG (2000 UT) CAPS, Take 1 capsule by mouth daily., Disp: , Rfl:    ciclopirox  (PENLAC ) 8 % solution, Apply topically at bedtime. Apply over nail and surrounding skin. Apply daily over previous coat. After seven (7) days, may remove with alcohol and continue cycle., Disp: 6.6 mL, Rfl: 0    Continuous Glucose Sensor (FREESTYLE LIBRE 3 SENSOR) MISC, Place 1 sensor on the skin every 14 days. Use to check glucose continuously, Disp: 6 each, Rfl: 1   Dulaglutide  (TRULICITY ) 0.75 MG/0.5ML SOAJ, Inject 0.75 mg into the skin once a week., Disp: 2 mL, Rfl: 1   FLUoxetine  (PROZAC ) 40 MG capsule, Take 1 capsule (40 mg total) by mouth daily., Disp: 90 capsule, Rfl: 3   fluticasone  (GNP FLUTICASONE  PROPIONATE) 50 MCG/ACT nasal spray, Place 1-2 sprays into both nostrils daily., Disp: 16 g, Rfl: 4   lisdexamfetamine (VYVANSE ) 70 MG capsule, Take 1 capsule (70 mg total) by mouth daily before breakfast., Disp: 30 capsule, Rfl: 0   metFORMIN  (GLUCOPHAGE -XR) 500 MG 24 hr tablet, Take 1 tablet (500 mg total) by mouth daily with breakfast., Disp: 90 tablet, Rfl: 1   pravastatin  (PRAVACHOL ) 10 MG tablet, Take 1 tablet (10 mg total) by mouth daily., Disp: 90 tablet, Rfl: 3   rizatriptan  (MAXALT ) 10 MG tablet, Take 1 tablet (10 mg total) by mouth as needed for migraine. May repeat in 2 hours if needed, Disp: 10 tablet, Rfl: 1   SYRINGE-NEEDLE, DISP, 3 ML (B-D 3CC LUER-LOK SYR 25GX1) 25G X 1 3 ML MISC, USE TO INJECT VIT B 12., Disp: 6 each, Rfl: 0   triamterene -hydrochlorothiazide  (MAXZIDE -25) 37.5-25 MG tablet, Take 1 tablet by mouth daily., Disp: 90 tablet, Rfl: 3      Objective:  BP (!) 125/98   Pulse 78   Temp 98.3 F (36.8 C) (Temporal)   Ht 5' 2 (1.575 m)   Wt 180 lb (81.6 kg)   LMP 05/07/2018 (Exact Date)   SpO2 96%   BMI 32.92 kg/m   Wt Readings from Last 3 Encounters:  09/03/23 180 lb (81.6 kg)  08/09/23 184 lb (83.5 kg)  07/05/23 181 lb (82.1 kg)    Physical Exam Constitutional:      General: She is not in acute distress.    Appearance: Normal appearance. She is normal weight. She is not ill-appearing, toxic-appearing or diaphoretic.  HENT:     Head: Normocephalic.  Cardiovascular:     Rate and Rhythm: Normal rate and regular rhythm.  Pulmonary:     Effort: Pulmonary  effort is normal.  Musculoskeletal:        General: Normal range of motion.  Neurological:     General: No focal deficit present.     Mental Status: She is alert and oriented to person, place, and time. Mental status is at baseline.  Psychiatric:        Mood and Affect: Mood normal.        Behavior: Behavior normal.        Thought Content: Thought content normal.        Judgment: Judgment normal.    Title   Diabetic Foot Exam - detailed Is there a history of foot ulcer?: No Is there a foot ulcer now?: No Is there swelling?: No Is there elevated skin temperature?: No Is there abnormal foot shape?: No Is there a claw toe deformity?: No Are the toenails long?: No Are the toenails thick?: No Are the toenails ingrown?: No Is the skin thin, fragile, shiny and hairless?: No Normal Range of Motion?: Yes Is there foot or ankle muscle weakness?: No Do you have pain in calf while walking?: Yes Are the shoes appropriate in style and fit?: Yes Can the patient see the bottom of their feet?: Yes Right Posterior Tibialis: Present Left posterior Tibialis: Present   Right Dorsalis Pedis: Present Left Dorsalis Pedis: Present     Semmes-Weinstein Monofilament Test + means has sensation and - means no sensation  R Foot Test Control: Pos L Foot Test Control: Pos   R Site 1-Great Toe: Pos L Site 1-Great Toe: Pos   R Site 4: Pos L Site 4: Pos   R site 5: Pos L Site 5: Pos  R Site 6: Pos L Site 6: Pos     Image components are not supported.   Image components are not supported. Image components are not supported.  [place] Comments            Assessment & Plan:  Elevated hemoglobin (HCC) Assessment & Plan: Repeat b12 and cbc today pending results.   Orders: -     CBC with Differential/Platelet; Future  Low sodium levels  Cessation of tobacco use in previous 12 months  Low serum vitamin B12 -     Vitamin B12; Future  Anxiety and depression Assessment &  Plan: Doing well, intermittent.  Continue prozac  40 mg once daily. Consult with psychiatry and consider therapy    ADD (attention deficit disorder) without hyperactivity Assessment & Plan: UDS today and non opioid contract in office.  PDMP reviewed. Refills sent for vyvanse  70 mg once daily    Mixed hyperlipidemia Assessment & Plan: Ordered lipid panel, pending results. Work on low cholesterol diet and exercise as tolerated Starting  pravastatin   Pt to repeat lipid panel x 3 months    Orders: -     Lipid panel; Future -     Pravastatin  Sodium; Take 1 tablet (10 mg total) by mouth daily.  Dispense: 90 tablet; Refill: 3 -     Lipid panel; Future  B12 deficiency Assessment & Plan: Continue otc daily  B12 ordered pending results  Orders: -     CBC with Differential/Platelet; Future -     Hemoglobin A1c; Future  Vitamin D  deficiency Assessment & Plan: Stressed daily vitamin D  Ordering and pending results.   Orders: -     VITAMIN D  25 Hydroxy (Vit-D Deficiency, Fractures); Future  Controlled type 2 diabetes mellitus without complication, without long-term current use of insulin (HCC) Assessment & Plan: Foot exam in office today  Urine m/a ordered Ordered hga1c today pending results. Work on diabetic diet and exercise as tolerated. Yearly foot exam, and annual eye exam.  Continue trulicity  0.75 mg weekly and metformin  XR 500 mg daily   Migraine without aura and without status migrainosus, not intractable  Essential hypertension Assessment & Plan: High diastolic in office today  Pt to go home and check blood pressures daily and report back via message x one week  Continue maxide as prescribed.   Orders: -     Basic metabolic panel; Future -     Microalbumin / creatinine urine ratio; Future  Toenail fungus Assessment & Plan: Start penlac  nail solution   Orders: -     Ciclopirox ; Apply topically at bedtime. Apply over nail and surrounding skin. Apply daily over  previous coat. After seven (7) days, may remove with alcohol and continue cycle.  Dispense: 6.6 mL; Refill: 0  High risk medication use -     DRUG MONITORING, PANEL 8 WITH CONFIRMATION, URINE; Future  Family history of melanoma Assessment & Plan: Discussed with pt following up with dermatology for high risk she will make appointment   At high risk for breast cancer Assessment & Plan: Pt up to date.      Return in about 3 months (around 12/02/2023) for f/u diabetes, f/u CPE.  Ginger Patrick, MSN, APRN, FNP-C [facility]

## 2023-09-03 NOTE — Assessment & Plan Note (Signed)
 Continue otc daily  B12 ordered pending results

## 2023-09-03 NOTE — Assessment & Plan Note (Signed)
 Foot exam in office today  Urine m/a ordered Ordered hga1c today pending results. Work on diabetic diet and exercise as tolerated. Yearly foot exam, and annual eye exam.  Continue trulicity 0.75 mg weekly and metformin XR 500 mg daily

## 2023-09-03 NOTE — Assessment & Plan Note (Signed)
Pt up to date

## 2023-09-09 ENCOUNTER — Other Ambulatory Visit: Payer: Self-pay | Admitting: Family

## 2023-09-09 DIAGNOSIS — F988 Other specified behavioral and emotional disorders with onset usually occurring in childhood and adolescence: Secondary | ICD-10-CM

## 2023-09-10 ENCOUNTER — Other Ambulatory Visit (HOSPITAL_COMMUNITY): Payer: Self-pay

## 2023-09-10 ENCOUNTER — Other Ambulatory Visit: Payer: Self-pay

## 2023-09-10 MED ORDER — LISDEXAMFETAMINE DIMESYLATE 70 MG PO CAPS
70.0000 mg | ORAL_CAPSULE | Freq: Every day | ORAL | 0 refills | Status: DC
  Filled 2023-09-10: qty 30, 30d supply, fill #0

## 2023-09-11 ENCOUNTER — Other Ambulatory Visit: Payer: Self-pay

## 2023-09-11 ENCOUNTER — Other Ambulatory Visit (INDEPENDENT_AMBULATORY_CARE_PROVIDER_SITE_OTHER): Payer: Commercial Managed Care - PPO

## 2023-09-11 ENCOUNTER — Encounter: Payer: Self-pay | Admitting: Family

## 2023-09-11 ENCOUNTER — Other Ambulatory Visit: Payer: Self-pay | Admitting: Family

## 2023-09-11 DIAGNOSIS — I10 Essential (primary) hypertension: Secondary | ICD-10-CM | POA: Diagnosis not present

## 2023-09-11 DIAGNOSIS — Z79899 Other long term (current) drug therapy: Secondary | ICD-10-CM

## 2023-09-11 DIAGNOSIS — E559 Vitamin D deficiency, unspecified: Secondary | ICD-10-CM

## 2023-09-11 DIAGNOSIS — E782 Mixed hyperlipidemia: Secondary | ICD-10-CM

## 2023-09-11 DIAGNOSIS — E538 Deficiency of other specified B group vitamins: Secondary | ICD-10-CM

## 2023-09-11 DIAGNOSIS — D582 Other hemoglobinopathies: Secondary | ICD-10-CM | POA: Diagnosis not present

## 2023-09-11 LAB — CBC WITH DIFFERENTIAL/PLATELET
Basophils Absolute: 0 10*3/uL (ref 0.0–0.1)
Basophils Relative: 0.5 % (ref 0.0–3.0)
Eosinophils Absolute: 0.2 10*3/uL (ref 0.0–0.7)
Eosinophils Relative: 1.8 % (ref 0.0–5.0)
HCT: 45.4 % (ref 36.0–46.0)
Hemoglobin: 15.1 g/dL — ABNORMAL HIGH (ref 12.0–15.0)
Lymphocytes Relative: 38.3 % (ref 12.0–46.0)
Lymphs Abs: 3.4 10*3/uL (ref 0.7–4.0)
MCHC: 33.2 g/dL (ref 30.0–36.0)
MCV: 87.6 fL (ref 78.0–100.0)
Monocytes Absolute: 0.6 10*3/uL (ref 0.1–1.0)
Monocytes Relative: 6.7 % (ref 3.0–12.0)
Neutro Abs: 4.7 10*3/uL (ref 1.4–7.7)
Neutrophils Relative %: 52.7 % (ref 43.0–77.0)
Platelets: 277 10*3/uL (ref 150.0–400.0)
RBC: 5.18 Mil/uL — ABNORMAL HIGH (ref 3.87–5.11)
RDW: 13.5 % (ref 11.5–15.5)
WBC: 8.9 10*3/uL (ref 4.0–10.5)

## 2023-09-11 LAB — LIPID PANEL
Cholesterol: 213 mg/dL — ABNORMAL HIGH (ref 0–200)
HDL: 37 mg/dL — ABNORMAL LOW (ref >39.00)
LDL Cholesterol: 139 mg/dL — ABNORMAL HIGH (ref 0–99)
NonHDL: 175.75
Total CHOL/HDL Ratio: 6
Triglycerides: 183 mg/dL — ABNORMAL HIGH (ref 0.0–149.0)
VLDL: 36.6 mg/dL (ref 0.0–40.0)

## 2023-09-11 LAB — BASIC METABOLIC PANEL
BUN: 17 mg/dL (ref 6–23)
CO2: 30 meq/L (ref 19–32)
Calcium: 9.7 mg/dL (ref 8.4–10.5)
Chloride: 97 meq/L (ref 96–112)
Creatinine, Ser: 0.86 mg/dL (ref 0.40–1.20)
GFR: 83.33 mL/min (ref >60.00)
Glucose, Bld: 101 mg/dL — ABNORMAL HIGH (ref 70–99)
Potassium: 3.9 meq/L (ref 3.5–5.1)
Sodium: 136 meq/L (ref 135–145)

## 2023-09-11 LAB — MICROALBUMIN / CREATININE URINE RATIO
Creatinine,U: 131.3 mg/dL
Microalb Creat Ratio: 1.5 mg/g (ref 0.0–30.0)
Microalb, Ur: 2 mg/dL — ABNORMAL HIGH (ref 0.0–1.9)

## 2023-09-11 LAB — HEMOGLOBIN A1C: Hgb A1c MFr Bld: 7.1 % — ABNORMAL HIGH (ref 4.6–6.5)

## 2023-09-11 LAB — VITAMIN D 25 HYDROXY (VIT D DEFICIENCY, FRACTURES): VITD: 19.18 ng/mL — ABNORMAL LOW (ref 30.00–100.00)

## 2023-09-11 LAB — VITAMIN B12: Vitamin B-12: 417 pg/mL (ref 211–911)

## 2023-09-11 MED ORDER — CHOLECALCIFEROL 1.25 MG (50000 UT) PO CAPS
50000.0000 [IU] | ORAL_CAPSULE | ORAL | 0 refills | Status: DC
  Filled 2023-09-11: qty 12, 84d supply, fill #0

## 2023-09-12 ENCOUNTER — Other Ambulatory Visit: Payer: Self-pay

## 2023-09-12 ENCOUNTER — Other Ambulatory Visit: Payer: Self-pay | Admitting: Family

## 2023-09-12 DIAGNOSIS — I10 Essential (primary) hypertension: Secondary | ICD-10-CM

## 2023-09-12 DIAGNOSIS — R809 Proteinuria, unspecified: Secondary | ICD-10-CM | POA: Insufficient documentation

## 2023-09-12 MED ORDER — LOSARTAN POTASSIUM 50 MG PO TABS
50.0000 mg | ORAL_TABLET | Freq: Every day | ORAL | 3 refills | Status: AC
  Filled 2023-09-12: qty 90, 90d supply, fill #0
  Filled 2023-11-15 – 2023-11-25 (×3): qty 90, 90d supply, fill #1
  Filled 2024-05-20: qty 90, 90d supply, fill #2

## 2023-09-13 ENCOUNTER — Other Ambulatory Visit (HOSPITAL_COMMUNITY): Payer: Self-pay

## 2023-09-14 LAB — DRUG MONITORING, PANEL 8 WITH CONFIRMATION, URINE
6 Acetylmorphine: NEGATIVE ng/mL (ref <10)
Alcohol Metabolites: NEGATIVE ng/mL (ref <500)
Amphetamine: 4468 ng/mL — ABNORMAL HIGH (ref <250)
Amphetamines: POSITIVE ng/mL — AB (ref <500)
Benzodiazepines: NEGATIVE ng/mL (ref <100)
Buprenorphine, Urine: NEGATIVE ng/mL (ref <5)
Cocaine Metabolite: NEGATIVE ng/mL (ref <150)
Creatinine: 144.4 mg/dL (ref >=20.0)
MDMA: NEGATIVE ng/mL (ref <500)
Marijuana Metabolite: NEGATIVE ng/mL (ref <20)
Methamphetamine: NEGATIVE ng/mL (ref <250)
Opiates: NEGATIVE ng/mL (ref <100)
Oxidant: NEGATIVE ug/mL (ref <200)
Oxycodone: NEGATIVE ng/mL (ref <100)
pH: 7 (ref 4.5–9.0)

## 2023-09-14 LAB — DM TEMPLATE

## 2023-09-24 ENCOUNTER — Telehealth: Payer: Commercial Managed Care - PPO | Admitting: Family Medicine

## 2023-09-25 ENCOUNTER — Other Ambulatory Visit: Payer: Self-pay

## 2023-09-25 ENCOUNTER — Encounter: Payer: Self-pay | Admitting: Family

## 2023-09-25 DIAGNOSIS — E119 Type 2 diabetes mellitus without complications: Secondary | ICD-10-CM

## 2023-09-25 MED ORDER — METFORMIN HCL ER 500 MG PO TB24
500.0000 mg | ORAL_TABLET | Freq: Two times a day (BID) | ORAL | 1 refills | Status: DC
  Filled 2023-09-25: qty 180, 90d supply, fill #0
  Filled 2023-12-26: qty 180, 90d supply, fill #1

## 2023-09-25 NOTE — Telephone Encounter (Signed)
 Waiting for response from pt to determine current metformin  dosing.

## 2023-09-25 NOTE — Addendum Note (Signed)
 Addended by: Dewanda Foots C on: 09/25/2023 12:35 PM   Modules accepted: Orders

## 2023-09-25 NOTE — Telephone Encounter (Signed)
 Spoke verbally with pt, she is taking metformin  xr 500mg  BID. Rx has been sent in.

## 2023-10-01 ENCOUNTER — Other Ambulatory Visit: Payer: Self-pay

## 2023-10-01 ENCOUNTER — Telehealth: Payer: Self-pay | Admitting: Family

## 2023-10-01 DIAGNOSIS — E119 Type 2 diabetes mellitus without complications: Secondary | ICD-10-CM

## 2023-10-01 MED ORDER — TRULICITY 1.5 MG/0.5ML ~~LOC~~ SOAJ
1.5000 mg | SUBCUTANEOUS | 0 refills | Status: DC
  Filled 2023-10-01: qty 2, 28d supply, fill #0

## 2023-10-01 NOTE — Addendum Note (Signed)
Addended by: Mort Sawyers on: 10/01/2023 03:02 PM   Modules accepted: Orders

## 2023-10-01 NOTE — Telephone Encounter (Signed)
Spoke with pt, she is needing a refill on Trulicity but is wondering if her dose needs to be increased. States that last month was much better and didn't have any side effects.

## 2023-10-11 ENCOUNTER — Other Ambulatory Visit: Payer: Self-pay | Admitting: Family

## 2023-10-11 DIAGNOSIS — F988 Other specified behavioral and emotional disorders with onset usually occurring in childhood and adolescence: Secondary | ICD-10-CM

## 2023-10-13 ENCOUNTER — Other Ambulatory Visit: Payer: Self-pay

## 2023-10-14 ENCOUNTER — Other Ambulatory Visit: Payer: Self-pay

## 2023-10-14 MED ORDER — LISDEXAMFETAMINE DIMESYLATE 70 MG PO CAPS: 70.0000 mg | ORAL_CAPSULE | Freq: Every day | ORAL | 0 refills | Status: DC

## 2023-10-14 MED ORDER — LISDEXAMFETAMINE DIMESYLATE 70 MG PO CAPS
70.0000 mg | ORAL_CAPSULE | Freq: Every day | ORAL | 0 refills | Status: DC
Start: 2023-11-25 — End: 2023-11-14

## 2023-10-14 MED ORDER — LISDEXAMFETAMINE DIMESYLATE 70 MG PO CAPS
70.0000 mg | ORAL_CAPSULE | Freq: Every day | ORAL | 0 refills | Status: DC
  Filled 2023-10-14: qty 30, 30d supply, fill #0

## 2023-11-06 ENCOUNTER — Other Ambulatory Visit: Payer: Self-pay | Admitting: Family

## 2023-11-06 ENCOUNTER — Other Ambulatory Visit: Payer: Self-pay

## 2023-11-06 DIAGNOSIS — E119 Type 2 diabetes mellitus without complications: Secondary | ICD-10-CM

## 2023-11-06 MED ORDER — TRULICITY 1.5 MG/0.5ML ~~LOC~~ SOAJ
1.5000 mg | SUBCUTANEOUS | 2 refills | Status: DC
  Filled 2023-11-06: qty 2, 28d supply, fill #0
  Filled 2023-12-26: qty 2, 28d supply, fill #1

## 2023-11-14 ENCOUNTER — Other Ambulatory Visit: Payer: Self-pay

## 2023-11-14 ENCOUNTER — Encounter: Payer: Self-pay | Admitting: Family

## 2023-11-14 DIAGNOSIS — F988 Other specified behavioral and emotional disorders with onset usually occurring in childhood and adolescence: Secondary | ICD-10-CM

## 2023-11-14 MED ORDER — LISDEXAMFETAMINE DIMESYLATE 70 MG PO CAPS
70.0000 mg | ORAL_CAPSULE | Freq: Every day | ORAL | 0 refills | Status: DC
  Filled 2023-11-14: qty 30, 30d supply, fill #0

## 2023-11-14 MED ORDER — AMPHETAMINE-DEXTROAMPHETAMINE 5 MG PO TABS
5.0000 mg | ORAL_TABLET | Freq: Every day | ORAL | 0 refills | Status: DC
  Filled 2023-11-14: qty 30, 30d supply, fill #0

## 2023-11-14 NOTE — Addendum Note (Signed)
 Addended by: Mort Sawyers on: 11/14/2023 02:51 PM   Modules accepted: Orders

## 2023-11-15 ENCOUNTER — Other Ambulatory Visit: Payer: Self-pay

## 2023-11-18 ENCOUNTER — Other Ambulatory Visit: Payer: Self-pay

## 2023-11-25 ENCOUNTER — Other Ambulatory Visit: Payer: Self-pay

## 2023-11-29 ENCOUNTER — Other Ambulatory Visit: Payer: Self-pay

## 2023-12-01 ENCOUNTER — Other Ambulatory Visit: Payer: Self-pay | Admitting: Physician Assistant

## 2023-12-02 ENCOUNTER — Other Ambulatory Visit: Payer: Self-pay

## 2023-12-02 ENCOUNTER — Other Ambulatory Visit: Payer: Self-pay | Admitting: Physician Assistant

## 2023-12-19 ENCOUNTER — Other Ambulatory Visit: Payer: Self-pay | Admitting: Family

## 2023-12-19 DIAGNOSIS — F988 Other specified behavioral and emotional disorders with onset usually occurring in childhood and adolescence: Secondary | ICD-10-CM

## 2023-12-20 ENCOUNTER — Other Ambulatory Visit: Payer: Self-pay

## 2023-12-20 MED ORDER — LISDEXAMFETAMINE DIMESYLATE 70 MG PO CAPS
70.0000 mg | ORAL_CAPSULE | Freq: Every day | ORAL | 0 refills | Status: DC
  Filled 2023-12-20: qty 30, 30d supply, fill #0

## 2023-12-26 ENCOUNTER — Other Ambulatory Visit: Payer: Self-pay | Admitting: Physician Assistant

## 2023-12-26 ENCOUNTER — Other Ambulatory Visit: Payer: Self-pay

## 2023-12-31 ENCOUNTER — Encounter: Payer: Self-pay | Admitting: Family

## 2023-12-31 ENCOUNTER — Other Ambulatory Visit: Payer: Self-pay | Admitting: Family

## 2023-12-31 DIAGNOSIS — L918 Other hypertrophic disorders of the skin: Secondary | ICD-10-CM | POA: Insufficient documentation

## 2024-01-06 ENCOUNTER — Ambulatory Visit (INDEPENDENT_AMBULATORY_CARE_PROVIDER_SITE_OTHER): Admitting: General Practice

## 2024-01-06 ENCOUNTER — Ambulatory Visit: Admitting: Family Medicine

## 2024-01-06 ENCOUNTER — Encounter: Payer: Self-pay | Admitting: General Practice

## 2024-01-06 ENCOUNTER — Other Ambulatory Visit: Payer: Self-pay

## 2024-01-06 VITALS — BP 120/82 | HR 99 | Temp 98.0°F | Ht 62.0 in | Wt 180.0 lb

## 2024-01-06 DIAGNOSIS — J208 Acute bronchitis due to other specified organisms: Secondary | ICD-10-CM | POA: Diagnosis not present

## 2024-01-06 DIAGNOSIS — B9689 Other specified bacterial agents as the cause of diseases classified elsewhere: Secondary | ICD-10-CM | POA: Insufficient documentation

## 2024-01-06 DIAGNOSIS — J45901 Unspecified asthma with (acute) exacerbation: Secondary | ICD-10-CM | POA: Insufficient documentation

## 2024-01-06 MED ORDER — ALBUTEROL SULFATE HFA 108 (90 BASE) MCG/ACT IN AERS
2.0000 | INHALATION_SPRAY | Freq: Four times a day (QID) | RESPIRATORY_TRACT | 3 refills | Status: AC | PRN
  Filled 2024-01-06: qty 6.7, 25d supply, fill #0

## 2024-01-06 MED ORDER — PROMETHAZINE-DM 6.25-15 MG/5ML PO SYRP
5.0000 mL | ORAL_SOLUTION | Freq: Four times a day (QID) | ORAL | 0 refills | Status: DC | PRN
  Filled 2024-01-06: qty 118, 6d supply, fill #0

## 2024-01-06 MED ORDER — PREDNISONE 20 MG PO TABS
40.0000 mg | ORAL_TABLET | Freq: Every day | ORAL | 0 refills | Status: AC
  Filled 2024-01-06: qty 10, 5d supply, fill #0

## 2024-01-06 MED ORDER — AZITHROMYCIN 250 MG PO TABS
ORAL_TABLET | ORAL | 0 refills | Status: AC
Start: 2024-01-06 — End: 2024-01-11
  Filled 2024-01-06: qty 6, 5d supply, fill #0

## 2024-01-06 NOTE — Assessment & Plan Note (Addendum)
 Given the length of her symptoms and presentation today, suspect bacterial involvement.   Start Azithromycin  antibiotics for infection. Take 2 tablets by mouth today, then 1 tablet daily for 4 additional days. Rx sent.   Start prednisone  20 mg tablets. Take 2 tablets by mouth once daily in the morning for 5 days. Rx sent.  Start Promethazine -DM cough syrup. Advised patient of side effects.  Refill sent for albuterol .   Follow up with pcp if symptoms worsen or do not improve.

## 2024-01-06 NOTE — Patient Instructions (Addendum)
 Start prednisone  20 mg tablets. Take 2 tablets by mouth once daily in the morning for 5 days.  Start Azithromycin  antibiotics for infection. Take 2 tablets by mouth today, then 1 tablet daily for 4 additional days.  Refill sent for albuterol .   Start Promethazine -DM cough syrup. It can make you drowsy. Please take it at night.  F/u with pcp if symptoms worsen or do not improve.   It was a pleasure to see you today!

## 2024-01-06 NOTE — Addendum Note (Signed)
 Addended by: Jolanda Nation on: 01/06/2024 09:16 AM   Modules accepted: Orders

## 2024-01-06 NOTE — Assessment & Plan Note (Signed)
 Given the length of her symptoms and presentation today, suspect bacterial involvement with asthma exacerbation.  Start Azithromycin  antibiotics for infection. Take 2 tablets by mouth today, then 1 tablet daily for 4 additional days. Rx sent.   Start prednisone  20 mg tablets. Take 2 tablets by mouth once daily in the morning for 5 days. Rx sent.  Refill sent for albuterol .   Follow up with pcp if symptoms worsen or do not improve.

## 2024-01-06 NOTE — Progress Notes (Addendum)
 Established Patient Office Visit  Subjective   Patient ID: Haley Mcdonald, female    DOB: 05-21-1981  Age: 43 y.o. MRN: [id number]  Chief Complaint  Patient presents with   Cough    Wheezing, SOB, congestion since Wednesday. Patient has been taking otc medications and did a breathing treatment this morning.     Cough Associated symptoms include a sore throat and wheezing. Pertinent negatives include no chest pain, chills, ear pain, fever, headaches, heartburn or shortness of breath.    Haley Mcdonald is a 43 year old female, patient of Felicita Horns, FNP, with past medical history of HTN, migraine, reactive airway disease, GERD, type 2 DM, presents today for an acute visit.  Cough: symptom onset was 01/01/24 with wheezing, shortness of breath and congestion. Her O2 was down to 93 sustaining and then after the breathing treatment it went up to 95%. Her chest hurts from from coughing. She is coughing up green phlegm. She tested for strep, covid and influenza last week which were all negative. She needs refill on her albuterol  inhaler. She denies any fever, chills, nausea or vomiting.   Hx of asthma. Hx of smoking. Up to date on influenza vaccine.   Patient Active Problem List   Diagnosis Date Noted   Moderate asthma with exacerbation 01/06/2024   Acute bacterial bronchitis 01/06/2024   Inflamed skin tag 12/31/2023   Microalbuminuria 09/12/2023   Toenail fungus 09/03/2023   Other proteinuria 07/05/2023   Gastroesophageal reflux disease with esophagitis without hemorrhage 03/07/2023   Controlled type 2 diabetes mellitus without complication, without long-term current use of insulin (HCC) 02/04/2023   Reactive airway disease 05/18/2022   Allergic rhinitis 05/18/2022   Essential hypertension 09/23/2019   B12 deficiency 09/23/2019   Migraine without aura and without status migrainosus, not intractable 09/23/2019   Anxiety and depression 07/08/2019   At high risk for breast  cancer 07/06/2019   Vitamin D  deficiency 12/20/2018   Mixed hyperlipidemia 12/20/2018   Status post laparoscopic hysterectomy 06/02/2018   Family history of breast cancer    Family history of melanoma    Family history of lymphoma    Cervical dysplasia    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   Past Medical History:  Diagnosis Date   ADD (attention deficit disorder) without hyperactivity    Anxiety    Asthma    as a child   Cervical dysplasia    Family history of breast cancer    Family history of lymphoma    Family history of melanoma    Headache    Migraine with aura    PONV (postoperative nausea and vomiting)    Pre-diabetes    gestational diabetes also   Pure hypercholesterolemia 12/20/2018   Allergies  Allergen Reactions   Penicillins Anaphylaxis, Shortness Of Breath and Other (See Comments)    Tachycardia  Has patient had a PCN reaction causing immediate rash, facial/tongue/throat swelling, SOB or lightheadedness with hypotension: Yes Has patient had a PCN reaction causing severe rash involving mucus membranes or skin necrosis: No Has patient had a PCN reaction that required hospitalization: No Has patient had a PCN reaction occurring within the last 10 years: Yes If all of the above answers are "NO", then may proceed with Cephalosporin use.    Bupropion Rash    Blister   Ciprodex  [Ciprofloxacin -Dexamethasone ] Other (See Comments)    Swelling of ear with treating EO   Ozempic  (0.25 Or 0.5 Mg-Dose) [Semaglutide (0.25 Or 0.5mg -Dos)] Nausea Only and  Other (See Comments)         09/03/2023    7:19 AM 03/07/2023    8:24 AM 10/08/2022    8:17 AM  Depression screen PHQ 2/9  Decreased Interest 1 1 2   Down, Depressed, Hopeless 1 1 2   PHQ - 2 Score 2 2 4   Altered sleeping 3 3 3   Tired, decreased energy 2 3 3   Change in appetite 0 3 1  Feeling bad or failure about yourself  1 1 2   Trouble concentrating 2 3 3   Moving slowly or  fidgety/restless 0 0 0  Suicidal thoughts 0 0 0  PHQ-9 Score 10 15 16   Difficult doing work/chores Somewhat difficult Somewhat difficult Somewhat difficult       09/03/2023    7:19 AM 03/07/2023    8:24 AM 10/08/2022    8:18 AM  GAD 7 : Generalized Anxiety Score  Nervous, Anxious, on Edge 0 1 1  Control/stop worrying 1 2 1   Worry too much - different things 1 2 1   Trouble relaxing 2 3 2   Restless 1 1 2   Easily annoyed or irritable 2 1 3   Afraid - awful might happen 0 0 0  Total GAD 7 Score 7 10 10   Anxiety Difficulty Somewhat difficult Somewhat difficult Somewhat difficult      Review of Systems  Constitutional:  Negative for chills and fever.  HENT:  Positive for sore throat. Negative for congestion, ear pain and sinus pain.   Respiratory:  Positive for cough and wheezing. Negative for shortness of breath.   Cardiovascular:  Negative for chest pain.  Gastrointestinal:  Negative for abdominal pain, constipation, diarrhea, heartburn, nausea and vomiting.  Genitourinary:  Negative for dysuria, frequency and urgency.  Neurological:  Negative for dizziness and headaches.  Endo/Heme/Allergies:  Negative for polydipsia.  Psychiatric/Behavioral:  Negative for depression and suicidal ideas. The patient is not nervous/anxious.       Objective:     BP 120/82 (BP Location: Left Arm, Patient Position: Sitting, Cuff Size: Normal)   Pulse 99   Temp 98 F (36.7 C) (Oral)   Ht 5\' 2"  (1.575 m)   Wt 180 lb (81.6 kg)   LMP 05/07/2018 (Exact Date)   SpO2 100%   BMI 32.92 kg/m  BP Readings from Last 3 Encounters:  01/06/24 120/82  09/03/23 (!) 125/98  08/09/23 128/82   Wt Readings from Last 3 Encounters:  01/06/24 180 lb (81.6 kg)  09/03/23 180 lb (81.6 kg)  08/09/23 184 lb (83.5 kg)      Physical Exam Vitals and nursing note reviewed.  Constitutional:      Appearance: Normal appearance.  HENT:     Right Ear: Tympanic membrane, ear canal and external ear normal.     Left  Ear: Tympanic membrane, ear canal and external ear normal.     Nose: Congestion present.     Mouth/Throat:     Mouth: Mucous membranes are moist.     Pharynx: Oropharynx is clear.  Eyes:     Conjunctiva/sclera: Conjunctivae normal.  Cardiovascular:     Rate and Rhythm: Normal rate and regular rhythm.     Pulses: Normal pulses.     Heart sounds: Normal heart sounds.  Pulmonary:     Effort: Pulmonary effort is normal.     Breath sounds: Wheezing present.  Chest:     Chest wall: No tenderness.  Skin:    General: Skin is warm.  Neurological:     Mental Status:  She is alert and oriented to person, place, and time.  Psychiatric:        Mood and Affect: Mood normal.        Behavior: Behavior normal.        Thought Content: Thought content normal.        Judgment: Judgment normal.      No results found for any visits on 01/06/24.     The 10-year ASCVD risk score (Arnett DK, et al., 2019) is: 3.4%    Assessment & Plan:  Acute bacterial bronchitis Assessment & Plan: Given the length of her symptoms and presentation today, suspect bacterial involvement.   Start Azithromycin  antibiotics for infection. Take 2 tablets by mouth today, then 1 tablet daily for 4 additional days. Rx sent.   Start prednisone  20 mg tablets. Take 2 tablets by mouth once daily in the morning for 5 days. Rx sent.  Start Promethazine -DM cough syrup. Advised patient of side effects.  Refill sent for albuterol .   Follow up with pcp if symptoms worsen or do not improve.  Orders: -     Azithromycin ; Take 2 tablets on day 1, then 1 tablet daily on days 2 through 5  Dispense: 6 tablet; Refill: 0 -     Promethazine -DM; Take 5 mLs by mouth 4 (four) times daily as needed.  Dispense: 118 mL; Refill: 0  Moderate asthma with exacerbation, unspecified whether persistent Assessment & Plan: Given the length of her symptoms and presentation today, suspect bacterial involvement with asthma exacerbation.  Start  Azithromycin  antibiotics for infection. Take 2 tablets by mouth today, then 1 tablet daily for 4 additional days. Rx sent.   Start prednisone  20 mg tablets. Take 2 tablets by mouth once daily in the morning for 5 days. Rx sent.  Refill sent for albuterol .   Follow up with pcp if symptoms worsen or do not improve.  Orders: -     Azithromycin ; Take 2 tablets on day 1, then 1 tablet daily on days 2 through 5  Dispense: 6 tablet; Refill: 0 -     predniSONE ; Take 2 tablets (40 mg total) by mouth daily for 5 days.  Dispense: 10 tablet; Refill: 0 -     Albuterol  Sulfate HFA; Inhale 2 puffs into the lungs every 6 (six) hours as needed for wheezing or shortness of breath.  Dispense: 6.7 g; Refill: 3     Return if symptoms worsen or fail to improve.    Jolanda Nation, NP

## 2024-01-07 ENCOUNTER — Other Ambulatory Visit: Payer: Self-pay | Admitting: Physician Assistant

## 2024-01-07 ENCOUNTER — Other Ambulatory Visit: Payer: Self-pay

## 2024-01-14 ENCOUNTER — Other Ambulatory Visit: Payer: Self-pay

## 2024-01-14 ENCOUNTER — Encounter: Payer: Self-pay | Admitting: Family

## 2024-01-14 ENCOUNTER — Ambulatory Visit (INDEPENDENT_AMBULATORY_CARE_PROVIDER_SITE_OTHER): Admitting: Family

## 2024-01-14 VITALS — BP 136/102 | HR 89 | Temp 98.1°F | Ht 62.0 in | Wt 184.6 lb

## 2024-01-14 DIAGNOSIS — F988 Other specified behavioral and emotional disorders with onset usually occurring in childhood and adolescence: Secondary | ICD-10-CM

## 2024-01-14 DIAGNOSIS — E119 Type 2 diabetes mellitus without complications: Secondary | ICD-10-CM

## 2024-01-14 DIAGNOSIS — E538 Deficiency of other specified B group vitamins: Secondary | ICD-10-CM | POA: Diagnosis not present

## 2024-01-14 DIAGNOSIS — E559 Vitamin D deficiency, unspecified: Secondary | ICD-10-CM | POA: Diagnosis not present

## 2024-01-14 DIAGNOSIS — Z7985 Long-term (current) use of injectable non-insulin antidiabetic drugs: Secondary | ICD-10-CM | POA: Diagnosis not present

## 2024-01-14 DIAGNOSIS — I10 Essential (primary) hypertension: Secondary | ICD-10-CM

## 2024-01-14 DIAGNOSIS — E669 Obesity, unspecified: Secondary | ICD-10-CM

## 2024-01-14 DIAGNOSIS — E782 Mixed hyperlipidemia: Secondary | ICD-10-CM

## 2024-01-14 MED ORDER — LISDEXAMFETAMINE DIMESYLATE 70 MG PO CAPS
70.0000 mg | ORAL_CAPSULE | Freq: Every day | ORAL | 0 refills | Status: DC
  Filled 2024-02-25: qty 30, 30d supply, fill #0

## 2024-01-14 MED ORDER — LISDEXAMFETAMINE DIMESYLATE 70 MG PO CAPS
70.0000 mg | ORAL_CAPSULE | Freq: Every day | ORAL | 0 refills | Status: DC
Start: 2024-01-14 — End: 2024-04-15
  Filled 2024-01-14 – 2024-01-17 (×2): qty 30, 30d supply, fill #0

## 2024-01-14 MED ORDER — AMPHETAMINE-DEXTROAMPHETAMINE 5 MG PO TABS: 5.0000 mg | ORAL_TABLET | Freq: Every day | ORAL | 0 refills | Status: AC

## 2024-01-14 MED ORDER — AMPHETAMINE-DEXTROAMPHETAMINE 5 MG PO TABS
5.0000 mg | ORAL_TABLET | Freq: Every day | ORAL | 0 refills | Status: AC
Start: 2024-01-14 — End: ?
  Filled 2024-01-14: qty 30, 30d supply, fill #0

## 2024-01-14 MED ORDER — TRULICITY 3 MG/0.5ML ~~LOC~~ SOAJ
3.0000 mg | SUBCUTANEOUS | 1 refills | Status: DC
Start: 2024-01-14 — End: 2024-03-20
  Filled 2024-01-14 – 2024-01-29 (×2): qty 2, 28d supply, fill #0
  Filled 2024-02-25: qty 2, 28d supply, fill #1

## 2024-01-14 MED ORDER — LISDEXAMFETAMINE DIMESYLATE 70 MG PO CAPS
70.0000 mg | ORAL_CAPSULE | Freq: Every day | ORAL | 0 refills | Status: AC
  Filled 2024-04-15: qty 30, 30d supply, fill #0

## 2024-01-14 NOTE — Assessment & Plan Note (Signed)
 Ordered vitamin d  pending results.  Continue with supplementation daily

## 2024-01-14 NOTE — Progress Notes (Signed)
 Established Patient Office Visit  Subjective:      CC:  Chief Complaint  Patient presents with   Medical Management of Chronic Issues    HPI: Haley Mcdonald is a 44 y.o. female presenting on 01/14/2024 for Medical Management of Chronic Issues  HTN: elevated today as she forgot to take her blood pressure medication.  She does check it at home and average is in the 130/80 at the highest end.  No cp palp sob. On losartan  50 mg once daily as well as maxzide -25  ADD: vyvanse  70 mg once daily and at times adderall 5 mg mid day, she has only had to take afternoon adderall 1-2 times. She states that they changed the manufacturer with vyvanse  and she noticed a difference negatively but with time it has started to work more. UDS 09/11/23 and drug contract same.   HLD: on pravastatin  10 mg nightly. Tolerating well. Started the statin 09/03/23  DM2: on metformin  500 mg po bid, on trulicity  1.5 mg weekly and tolerated well, has been on this over 4 weeks. She is ready to dose up.   Wt Readings from Last 3 Encounters:  01/14/24 184 lb 9.6 oz (83.7 kg)  01/06/24 180 lb (81.6 kg)  09/03/23 180 lb (81.6 kg)           Social history:  Relevant past medical, surgical, family and social history reviewed and updated as indicated. Interim medical history since our last visit reviewed.  Allergies and medications reviewed and updated.  DATA REVIEWED: CHART IN EPIC     ROS: Negative unless specifically indicated above in HPI.    Current Outpatient Medications:    albuterol  (VENTOLIN  HFA) 108 (90 Base) MCG/ACT inhaler, Inhale 2 puffs into the lungs every 6 (six) hours as needed for wheezing or shortness of breath., Disp: 6.7 g, Rfl: 3   Cholecalciferol  (VITAMIN D ) 50 MCG (2000 UT) CAPS, Take 1 capsule by mouth daily., Disp: , Rfl:    Cholecalciferol  1.25 MG (50000 UT) capsule, Take 1 capsule (50,000 Units total) by mouth once a week., Disp: 12 capsule, Rfl: 0   ciclopirox   (PENLAC ) 8 % solution, Apply topically at bedtime. Apply over nail and surrounding skin. Apply daily over previous coat. After seven (7) days, may remove with alcohol and continue cycle., Disp: 6.6 mL, Rfl: 0   Continuous Glucose Sensor (FREESTYLE LIBRE 3 SENSOR) MISC, Place 1 sensor on the skin every 14 days. Use to check glucose continuously, Disp: 6 each, Rfl: 1   Dulaglutide  (TRULICITY ) 3 MG/0.5ML SOAJ, Inject 3 mg as directed once a week., Disp: 6 mL, Rfl: 1   FLUoxetine  (PROZAC ) 40 MG capsule, Take 1 capsule (40 mg total) by mouth daily., Disp: 90 capsule, Rfl: 3   fluticasone  (GNP FLUTICASONE  PROPIONATE) 50 MCG/ACT nasal spray, Place 1-2 sprays into both nostrils daily., Disp: 16 g, Rfl: 4   losartan  (COZAAR ) 50 MG tablet, Take 1 tablet (50 mg total) by mouth daily., Disp: 90 tablet, Rfl: 3   metFORMIN  (GLUCOPHAGE -XR) 500 MG 24 hr tablet, Take 1 tablet (500 mg total) by mouth 2 (two) times daily with a meal., Disp: 180 tablet, Rfl: 1   pravastatin  (PRAVACHOL ) 10 MG tablet, Take 1 tablet (10 mg total) by mouth daily., Disp: 90 tablet, Rfl: 3   rizatriptan  (MAXALT ) 10 MG tablet, Take 1 tablet (10 mg total) by mouth as needed for migraine. May repeat in 2 hours if needed, Disp: 10 tablet, Rfl: 1   SYRINGE-NEEDLE, DISP, 3  ML (B-D 3CC LUER-LOK SYR 25GX1") 25G X 1" 3 ML MISC, USE TO INJECT VIT B 12., Disp: 6 each, Rfl: 0   triamterene -hydrochlorothiazide  (MAXZIDE -25) 37.5-25 MG tablet, Take 1 tablet by mouth daily., Disp: 90 tablet, Rfl: 3   amphetamine -dextroamphetamine  (ADDERALL) 5 MG tablet, Take 1 tablet (5 mg total) by mouth daily., Disp: 30 tablet, Rfl: 0   [START ON 02/04/2024] amphetamine -dextroamphetamine  (ADDERALL) 5 MG tablet, Take 1 tablet (5 mg total) by mouth daily., Disp: 30 tablet, Rfl: 0   [START ON 02/25/2024] amphetamine -dextroamphetamine  (ADDERALL) 5 MG tablet, Take 1 tablet (5 mg total) by mouth daily., Disp: 30 tablet, Rfl: 0   lisdexamfetamine (VYVANSE ) 70 MG capsule, Take 1  capsule (70 mg total) by mouth daily before breakfast., Disp: 30 capsule, Rfl: 0   [START ON 02/04/2024] lisdexamfetamine (VYVANSE ) 70 MG capsule, Take 1 capsule (70 mg total) by mouth daily before breakfast., Disp: 30 capsule, Rfl: 0   [START ON 02/25/2024] lisdexamfetamine (VYVANSE ) 70 MG capsule, Take 1 capsule (70 mg total) by mouth daily before breakfast., Disp: 30 capsule, Rfl: 0      Objective:     BP (!) 136/102 (BP Location: Left Arm, Patient Position: Sitting, Cuff Size: Normal)   Pulse 89   Temp 98.1 F (36.7 C) (Temporal)   Ht 5\' 2"  (1.575 m)   Wt 184 lb 9.6 oz (83.7 kg)   LMP 05/07/2018 (Exact Date)   SpO2 97%   BMI 33.76 kg/m   Wt Readings from Last 3 Encounters:  01/14/24 184 lb 9.6 oz (83.7 kg)  01/06/24 180 lb (81.6 kg)  09/03/23 180 lb (81.6 kg)    Physical Exam Constitutional:      General: She is not in acute distress.    Appearance: Normal appearance. She is obese. She is not ill-appearing, toxic-appearing or diaphoretic.  HENT:     Head: Normocephalic.  Cardiovascular:     Rate and Rhythm: Normal rate and regular rhythm.  Pulmonary:     Effort: Pulmonary effort is normal.  Musculoskeletal:        General: Normal range of motion.  Neurological:     General: No focal deficit present.     Mental Status: She is alert and oriented to person, place, and time. Mental status is at baseline.  Psychiatric:        Mood and Affect: Mood normal.        Behavior: Behavior normal.        Thought Content: Thought content normal.        Judgment: Judgment normal.           Assessment & Plan:  Controlled type 2 diabetes mellitus without complication, without long-term current use of insulin (HCC) -     Trulicity ; Inject 3 mg as directed once a week.  Dispense: 6 mL; Refill: 1 -     Comprehensive metabolic panel with GFR; Future -     Hemoglobin A1c; Future  Mixed hyperlipidemia Assessment & Plan: Continue pravastatin  10 mg  Ordered lipid panel, pending  results. Work on low cholesterol diet and exercise as tolerated   Orders: -     Lipid panel; Future  Vitamin D  deficiency Assessment & Plan: Ordered vitamin d  pending results.  Continue with supplementation daily   Orders: -     VITAMIN D  25 Hydroxy (Vit-D Deficiency, Fractures); Future  Essential hypertension Assessment & Plan: Pt to go home and take medications  Pt to go home and check blood pressures daily and  report back via message x one week  Continue maxide as prescribed. And losartan  50 mg    Obesity (BMI 30-39.9) -     T4, free; Future -     TSH; Future  B12 deficiency -     CBC; Future  ADD (attention deficit disorder) without hyperactivity Assessment & Plan: Pdmp reviewed Refills sent for vyvanse  70 mg and adderall 5 mg prn mid day  Uds and contract utd.  asymptomatic  Orders: -     Lisdexamfetamine Dimesylate ; Take 1 capsule (70 mg total) by mouth daily before breakfast.  Dispense: 30 capsule; Refill: 0 -     Amphetamine -Dextroamphetamine ; Take 1 tablet (5 mg total) by mouth daily.  Dispense: 30 tablet; Refill: 0 -     Amphetamine -Dextroamphetamine ; Take 1 tablet (5 mg total) by mouth daily.  Dispense: 30 tablet; Refill: 0 -     Amphetamine -Dextroamphetamine ; Take 1 tablet (5 mg total) by mouth daily.  Dispense: 30 tablet; Refill: 0 -     Lisdexamfetamine Dimesylate ; Take 1 capsule (70 mg total) by mouth daily before breakfast.  Dispense: 30 capsule; Refill: 0 -     Lisdexamfetamine Dimesylate ; Take 1 capsule (70 mg total) by mouth daily before breakfast.  Dispense: 30 capsule; Refill: 0     Return in about 3 months (around 04/15/2024) for f/u ADD medication.  Haley Horns, MSN, APRN, FNP-C [facility]

## 2024-01-14 NOTE — Assessment & Plan Note (Signed)
 Continue pravastatin  10 mg  Ordered lipid panel, pending results. Work on low cholesterol diet and exercise as tolerated

## 2024-01-14 NOTE — Assessment & Plan Note (Signed)
 Pt to go home and take medications  Pt to go home and check blood pressures daily and report back via message x one week  Continue maxide as prescribed. And losartan  50 mg

## 2024-01-14 NOTE — Assessment & Plan Note (Signed)
 Pdmp reviewed Refills sent for vyvanse  70 mg and adderall 5 mg prn mid day  Uds and contract utd.  asymptomatic

## 2024-01-17 ENCOUNTER — Other Ambulatory Visit: Payer: Self-pay

## 2024-01-20 ENCOUNTER — Other Ambulatory Visit: Payer: Self-pay

## 2024-01-22 ENCOUNTER — Other Ambulatory Visit: Payer: Self-pay | Admitting: Physician Assistant

## 2024-01-22 ENCOUNTER — Other Ambulatory Visit: Payer: Self-pay

## 2024-01-27 ENCOUNTER — Other Ambulatory Visit: Payer: Self-pay | Admitting: *Deleted

## 2024-01-27 ENCOUNTER — Other Ambulatory Visit: Payer: Self-pay

## 2024-01-27 MED ORDER — FLUOXETINE HCL 40 MG PO CAPS
40.0000 mg | ORAL_CAPSULE | Freq: Every day | ORAL | 3 refills | Status: DC
Start: 2024-01-27 — End: 2024-03-20
  Filled 2024-01-27: qty 90, 90d supply, fill #0

## 2024-01-28 ENCOUNTER — Ambulatory Visit: Admitting: Physician Assistant

## 2024-01-28 VITALS — BP 126/100

## 2024-01-28 DIAGNOSIS — D492 Neoplasm of unspecified behavior of bone, soft tissue, and skin: Secondary | ICD-10-CM | POA: Diagnosis not present

## 2024-01-28 DIAGNOSIS — A630 Anogenital (venereal) warts: Secondary | ICD-10-CM | POA: Diagnosis not present

## 2024-01-28 DIAGNOSIS — D489 Neoplasm of uncertain behavior, unspecified: Secondary | ICD-10-CM

## 2024-01-28 NOTE — Progress Notes (Signed)
   New Patient Visit   Subjective  Haley Mcdonald is a 43 y.o. female who presents for the following: Skin tag  Pt has a skin tag on L inner buttock. Gets irritated when shaving or wearing underwear. Has bled a couple times when it gets caught in clothing.    The following portions of the chart were reviewed this encounter and updated as appropriate: medications, allergies, medical history  Review of Systems:  No other skin or systemic complaints except as noted in HPI or Assessment and Plan.  Objective  Well appearing patient in no apparent distress; mood and affect are within normal limits.  A focused examination was performed of the following areas: L inner buttock and genital region.   Relevant exam findings are noted in the Assessment and Plan.  Pubic Pink tan papule   Assessment & Plan      NEOPLASM OF UNCERTAIN BEHAVIOR Pubic Skin / nail biopsy Type of biopsy: tangential   Informed consent: discussed and consent obtained   Timeout: patient name, date of birth, surgical site, and procedure verified   Procedure prep:  Patient was prepped and draped in usual sterile fashion (Area prepped with alcohol) Prep type:  Isopropyl alcohol Anesthesia: the lesion was anesthetized in a standard fashion   Anesthetic:  1% lidocaine  w/ epinephrine  1-100,000 buffered w/ 8.4% NaHCO3 Instrument used: scissors   Hemostasis achieved with: aluminum chloride and electrodesiccation   Outcome: patient tolerated procedure well   Post-procedure details: wound care instructions given   Post-procedure details comment:  Ointment and small bandage applied Specimen 1 - Surgical pathology Differential Diagnosis: condyloma vs skin tag  Check Margins: No  No follow-ups on file.  I, Anner Bars, CMA, am acting as scribe for Bethania Schlotzhauer K, PA-C.   Documentation: I have reviewed the above documentation for accuracy and completeness, and I agree with the above.  Haley Mcdonald  K, PA-C

## 2024-01-28 NOTE — Patient Instructions (Signed)
 Wound Care Instructions  Cleanse wound gently with soap and water once a day then pat dry with clean gauze. Apply a thin coat of Petrolatum (petroleum jelly, "Vaseline") over the wound (unless you have an allergy to this). We recommend that you use a new, sterile tube of Vaseline. Do not pick or remove scabs. Do not remove the yellow or white "healing tissue" from the base of the wound.  Cover the wound with fresh, clean, nonstick gauze and secure with paper tape. You may use Band-Aids in place of gauze and tape if the wound is small enough, but would recommend trimming much of the tape off as there is often too much. Sometimes Band-Aids can irritate the skin.  You should call the office for your biopsy report after 1 week if you have not already been contacted.  If you experience any problems, such as abnormal amounts of bleeding, swelling, significant bruising, significant pain, or evidence of infection, please call the office immediately.  FOR ADULT SURGERY PATIENTS: If you need something for pain relief you may take 1 extra strength Tylenol  (acetaminophen ) AND 2 Ibuprofen  (200mg  each) together every 4 hours as needed for pain. (do not take these if you are allergic to them or if you have a reason you should not take them.) Typically, you may only need pain medication for 1 to 3 days.   Due to recent changes in healthcare laws, you may see results of your pathology and/or laboratory studies on MyChart before the doctors have had a chance to review them. We understand that in some cases there may be results that are confusing or concerning to you. Please understand that not all results are received at the same time and often the doctors may need to interpret multiple results in order to provide you with the best plan of care or course of treatment. Therefore, we ask that you please give us  2 business days to thoroughly review all your results before contacting the office for clarification. Should we  see a critical lab result, you will be contacted sooner.   If You Need Anything After Your Visit  If you have any questions or concerns for your doctor, please call our main line at [phone] and press option 4 to reach your doctor's medical assistant. If no one answers, please leave a voicemail as directed and we will return your call as soon as possible. Messages left after 4 pm will be answered the following business day.   You may also send us  a message via MyChart. We typically respond to MyChart messages within 1-2 business days.  For prescription refills, please ask your pharmacy to contact our office. Our fax number is [phone].  If you have an urgent issue when the clinic is closed that cannot wait until the next business day, you can page your doctor at the number below.    Please note that while we do our best to be available for urgent issues outside of office hours, we are not available 24/7.   If you have an urgent issue and are unable to reach us , you may choose to seek medical care at your doctor's office, retail clinic, urgent care center, or emergency room.  If you have a medical emergency, please immediately call 911 or go to the emergency department.   Dermatology Medication Tips: Please keep the boxes that topical medications come in in order to help keep track of the instructions about where and how to use these. Pharmacies typically print  the medication instructions only on the boxes and not directly on the medication tubes.   If your medication is too expensive, please contact our office at [phone] option 4 or send us  a message through MyChart.   We are unable to tell what your co-pay for medications will be in advance as this is different depending on your insurance coverage. However, we may be able to find a substitute medication at lower cost or fill out paperwork to get insurance to cover a needed medication.   If a prior authorization is required to  get your medication covered by your insurance company, please allow us  1-2 business days to complete this process.  Drug prices often vary depending on where the prescription is filled and some pharmacies may offer cheaper prices.  The website www.goodrx.com contains coupons for medications through different pharmacies. The prices here do not account for what the cost may be with help from insurance (it may be cheaper with your insurance), but the website can give you the price if you did not use any insurance.  - You can print the associated coupon and take it with your prescription to the pharmacy.  - You may also stop by our office during regular business hours and pick up a GoodRx coupon card.  - If you need your prescription sent electronically to a different pharmacy, notify our office through [facility] or by phone at [phone] option 4.

## 2024-01-29 ENCOUNTER — Other Ambulatory Visit: Payer: Self-pay

## 2024-01-30 LAB — SURGICAL PATHOLOGY

## 2024-02-02 ENCOUNTER — Ambulatory Visit: Payer: Self-pay | Admitting: Physician Assistant

## 2024-02-03 DIAGNOSIS — E119 Type 2 diabetes mellitus without complications: Secondary | ICD-10-CM | POA: Diagnosis not present

## 2024-02-03 DIAGNOSIS — H5203 Hypermetropia, bilateral: Secondary | ICD-10-CM | POA: Diagnosis not present

## 2024-02-04 ENCOUNTER — Ambulatory Visit: Payer: Self-pay | Admitting: Family

## 2024-02-04 ENCOUNTER — Other Ambulatory Visit: Payer: Self-pay

## 2024-02-04 ENCOUNTER — Other Ambulatory Visit (INDEPENDENT_AMBULATORY_CARE_PROVIDER_SITE_OTHER)

## 2024-02-04 DIAGNOSIS — E119 Type 2 diabetes mellitus without complications: Secondary | ICD-10-CM | POA: Diagnosis not present

## 2024-02-04 DIAGNOSIS — E669 Obesity, unspecified: Secondary | ICD-10-CM | POA: Diagnosis not present

## 2024-02-04 DIAGNOSIS — E538 Deficiency of other specified B group vitamins: Secondary | ICD-10-CM

## 2024-02-04 DIAGNOSIS — E782 Mixed hyperlipidemia: Secondary | ICD-10-CM

## 2024-02-04 DIAGNOSIS — E559 Vitamin D deficiency, unspecified: Secondary | ICD-10-CM | POA: Diagnosis not present

## 2024-02-04 LAB — HEMOGLOBIN A1C: Hgb A1c MFr Bld: 6.2 % (ref 4.6–6.5)

## 2024-02-04 LAB — COMPREHENSIVE METABOLIC PANEL WITH GFR
ALT: 15 U/L (ref 0–35)
AST: 11 U/L (ref 0–37)
Albumin: 4.6 g/dL (ref 3.5–5.2)
Alkaline Phosphatase: 71 U/L (ref 39–117)
BUN: 9 mg/dL (ref 6–23)
CO2: 28 meq/L (ref 19–32)
Calcium: 9.2 mg/dL (ref 8.4–10.5)
Chloride: 102 meq/L (ref 96–112)
Creatinine, Ser: 0.74 mg/dL (ref 0.40–1.20)
GFR: 99.52 mL/min (ref >60.00)
Glucose, Bld: 97 mg/dL (ref 70–99)
Potassium: 3.9 meq/L (ref 3.5–5.1)
Sodium: 138 meq/L (ref 135–145)
Total Bilirubin: 0.4 mg/dL (ref 0.2–1.2)
Total Protein: 6.6 g/dL (ref 6.0–8.3)

## 2024-02-04 LAB — LIPID PANEL
Cholesterol: 185 mg/dL (ref 0–200)
HDL: 36.2 mg/dL — ABNORMAL LOW (ref >39.00)
LDL Cholesterol: 120 mg/dL — ABNORMAL HIGH (ref 0–99)
NonHDL: 148.33
Total CHOL/HDL Ratio: 5
Triglycerides: 143 mg/dL (ref 0.0–149.0)
VLDL: 28.6 mg/dL (ref 0.0–40.0)

## 2024-02-04 LAB — T4, FREE: Free T4: 0.68 ng/dL (ref 0.60–1.60)

## 2024-02-04 LAB — CBC
HCT: 41.9 % (ref 36.0–46.0)
Hemoglobin: 13.9 g/dL (ref 12.0–15.0)
MCHC: 33.1 g/dL (ref 30.0–36.0)
MCV: 85.9 fl (ref 78.0–100.0)
Platelets: 239 10*3/uL (ref 150.0–400.0)
RBC: 4.88 Mil/uL (ref 3.87–5.11)
RDW: 14.1 % (ref 11.5–15.5)
WBC: 8.3 10*3/uL (ref 4.0–10.5)

## 2024-02-04 LAB — VITAMIN D 25 HYDROXY (VIT D DEFICIENCY, FRACTURES): VITD: 35 ng/mL (ref 30.00–100.00)

## 2024-02-04 LAB — TSH: TSH: 3.58 u[IU]/mL (ref 0.35–5.50)

## 2024-02-04 MED ORDER — PRAVASTATIN SODIUM 20 MG PO TABS
20.0000 mg | ORAL_TABLET | Freq: Every day | ORAL | 3 refills | Status: AC
  Filled 2024-02-04: qty 90, 90d supply, fill #0
  Filled 2024-05-20: qty 90, 90d supply, fill #1

## 2024-02-25 DIAGNOSIS — F5105 Insomnia due to other mental disorder: Secondary | ICD-10-CM | POA: Diagnosis not present

## 2024-02-25 DIAGNOSIS — F411 Generalized anxiety disorder: Secondary | ICD-10-CM | POA: Diagnosis not present

## 2024-02-25 DIAGNOSIS — F331 Major depressive disorder, recurrent, moderate: Secondary | ICD-10-CM | POA: Diagnosis not present

## 2024-02-26 ENCOUNTER — Other Ambulatory Visit: Payer: Self-pay

## 2024-03-05 ENCOUNTER — Encounter: Admitting: Obstetrics & Gynecology

## 2024-03-16 ENCOUNTER — Other Ambulatory Visit: Payer: Self-pay

## 2024-03-16 DIAGNOSIS — F5105 Insomnia due to other mental disorder: Secondary | ICD-10-CM | POA: Diagnosis not present

## 2024-03-16 DIAGNOSIS — F331 Major depressive disorder, recurrent, moderate: Secondary | ICD-10-CM | POA: Diagnosis not present

## 2024-03-16 DIAGNOSIS — F411 Generalized anxiety disorder: Secondary | ICD-10-CM | POA: Diagnosis not present

## 2024-03-16 MED ORDER — FLUOXETINE HCL 40 MG PO CAPS
40.0000 mg | ORAL_CAPSULE | Freq: Every day | ORAL | 0 refills | Status: DC
  Filled 2024-05-20: qty 90, 90d supply, fill #0

## 2024-03-17 DIAGNOSIS — F411 Generalized anxiety disorder: Secondary | ICD-10-CM | POA: Diagnosis not present

## 2024-03-20 ENCOUNTER — Other Ambulatory Visit: Payer: Self-pay

## 2024-03-20 ENCOUNTER — Ambulatory Visit
Admission: RE | Admit: 2024-03-20 | Discharge: 2024-03-20 | Disposition: A | Discharge status: Home / Self Care | Source: Ambulatory Visit | Attending: Family | Admitting: Family

## 2024-03-20 ENCOUNTER — Ambulatory Visit: Payer: Self-pay | Admitting: Family

## 2024-03-20 ENCOUNTER — Ambulatory Visit (INDEPENDENT_AMBULATORY_CARE_PROVIDER_SITE_OTHER): Admitting: Family

## 2024-03-20 VITALS — BP 102/84 | HR 70 | Temp 98.9°F | Ht 62.0 in | Wt 170.0 lb

## 2024-03-20 DIAGNOSIS — R198 Other specified symptoms and signs involving the digestive system and abdomen: Secondary | ICD-10-CM | POA: Insufficient documentation

## 2024-03-20 DIAGNOSIS — R1010 Upper abdominal pain, unspecified: Secondary | ICD-10-CM | POA: Diagnosis not present

## 2024-03-20 DIAGNOSIS — K219 Gastro-esophageal reflux disease without esophagitis: Secondary | ICD-10-CM

## 2024-03-20 DIAGNOSIS — G43009 Migraine without aura, not intractable, without status migrainosus: Secondary | ICD-10-CM

## 2024-03-20 DIAGNOSIS — E119 Type 2 diabetes mellitus without complications: Secondary | ICD-10-CM

## 2024-03-20 DIAGNOSIS — R1114 Bilious vomiting: Secondary | ICD-10-CM | POA: Diagnosis not present

## 2024-03-20 DIAGNOSIS — R1011 Right upper quadrant pain: Secondary | ICD-10-CM | POA: Diagnosis not present

## 2024-03-20 LAB — CBC WITH DIFFERENTIAL/PLATELET
Basophils Absolute: 0.1 K/uL (ref 0.0–0.1)
Basophils Relative: 0.6 % (ref 0.0–3.0)
Eosinophils Absolute: 0.2 K/uL (ref 0.0–0.7)
Eosinophils Relative: 1.7 % (ref 0.0–5.0)
HCT: 47.7 % — ABNORMAL HIGH (ref 36.0–46.0)
Hemoglobin: 15.7 g/dL — ABNORMAL HIGH (ref 12.0–15.0)
Lymphocytes Relative: 34.6 % (ref 12.0–46.0)
Lymphs Abs: 3.5 K/uL (ref 0.7–4.0)
MCHC: 32.9 g/dL (ref 30.0–36.0)
MCV: 86.4 fl (ref 78.0–100.0)
Monocytes Absolute: 0.8 K/uL (ref 0.1–1.0)
Monocytes Relative: 8.3 % (ref 3.0–12.0)
Neutro Abs: 5.6 K/uL (ref 1.4–7.7)
Neutrophils Relative %: 54.8 % (ref 43.0–77.0)
Platelets: 306 K/uL (ref 150.0–400.0)
RBC: 5.52 Mil/uL — ABNORMAL HIGH (ref 3.87–5.11)
RDW: 13.8 % (ref 11.5–15.5)
WBC: 10.2 K/uL (ref 4.0–10.5)

## 2024-03-20 LAB — COMPREHENSIVE METABOLIC PANEL WITH GFR
ALT: 31 U/L (ref 0–35)
AST: 19 U/L (ref 0–37)
Albumin: 5.2 g/dL (ref 3.5–5.2)
Alkaline Phosphatase: 76 U/L (ref 39–117)
BUN: 23 mg/dL (ref 6–23)
CO2: 30 meq/L (ref 19–32)
Calcium: 10.1 mg/dL (ref 8.4–10.5)
Chloride: 94 meq/L — ABNORMAL LOW (ref 96–112)
Creatinine, Ser: 1.13 mg/dL (ref 0.40–1.20)
GFR: 59.83 mL/min — ABNORMAL LOW (ref >60.00)
Glucose, Bld: 120 mg/dL — ABNORMAL HIGH (ref 70–99)
Potassium: 3.6 meq/L (ref 3.5–5.1)
Sodium: 135 meq/L (ref 135–145)
Total Bilirubin: 0.7 mg/dL (ref 0.2–1.2)
Total Protein: 8 g/dL (ref 6.0–8.3)

## 2024-03-20 LAB — LIPASE: Lipase: 11 U/L (ref 11.0–59.0)

## 2024-03-20 LAB — AMYLASE: Amylase: 23 U/L — ABNORMAL LOW (ref 27–131)

## 2024-03-20 MED ORDER — RIZATRIPTAN BENZOATE 10 MG PO TABS
10.0000 mg | ORAL_TABLET | ORAL | 2 refills | Status: AC | PRN
  Filled 2024-03-20: qty 10, 30d supply, fill #0

## 2024-03-20 MED ORDER — OMEPRAZOLE 20 MG PO CPDR
20.0000 mg | DELAYED_RELEASE_CAPSULE | Freq: Every day | ORAL | 0 refills | Status: DC
  Filled 2024-03-20: qty 30, 30d supply, fill #0

## 2024-03-20 NOTE — Assessment & Plan Note (Addendum)
 Cbc cmp amylase lipase  Pending results.  H pylori at a later date, when vomiting has dissipated and if still with sx Stat u/s ruq r/o cholecystitis visualize liver and gallbladder   Advised pt of ER precautions Dehydration risk, push fluids as tolerated.  Overall vitals stable.   If u/s negative consider HIDA  Stop trulicity  as suspected culprit

## 2024-03-20 NOTE — Patient Instructions (Addendum)
  I have sent in your order electronically for the following: u/s abdomen right upper quadrant at this location below. Please call to schedule the appointment at your convenience  [facility] outpatient imaging center off [address], [place] [address] Phone [phone]-  8-5 pm

## 2024-03-20 NOTE — Progress Notes (Signed)
 Established Patient Office Visit  Subjective:   Patient ID: Haley Mcdonald, female    DOB: June 08, 1981  Age: 43 y.o. MRN: [id number]  CC:  Chief Complaint  Patient presents with   Nausea and Vomiting    Started about 2 weeks ago. At least 2 episodes of vomiting daily. Nothing helps.     HPI: Haley Mcdonald is a 43 y.o. female presenting on 03/20/2024 for Nausea and Vomiting (Started about 2 weeks ago. At least 2 episodes of vomiting daily. Nothing helps. )  For the last two weeks really not feeling well at all.  Throwing up when she eats but not always just then.  Burping a lot and with gas, with sulfur burps.  Acid reflux pretty signicant.  No issues with beef or pork specifically.  Increased fatigue, going from work and sleeping.  No diarrhea or constipation.  Denies urinary symptoms.  No back pain.  No blood in stool.  No mucous in stool.   Denies any change in meds recently.   Taking zofran  while working and phenergan  while at home.  Able to drink water, on her third glass of water today.    Is taking trulicity  3 mg last increase was around in may.  Wt Readings from Last 3 Encounters:  03/20/24 170 lb (77.1 kg)  01/14/24 184 lb 9.6 oz (83.7 kg)  01/06/24 180 lb (81.6 kg)           ROS: Negative unless specifically indicated above in HPI.   Relevant past medical history reviewed and updated as indicated.   Allergies and medications reviewed and updated.   Current Outpatient Medications:    albuterol  (VENTOLIN  HFA) 108 (90 Base) MCG/ACT inhaler, Inhale 2 puffs into the lungs every 6 (six) hours as needed for wheezing or shortness of breath., Disp: 6.7 g, Rfl: 3   amphetamine -dextroamphetamine  (ADDERALL) 5 MG tablet, Take 1 tablet (5 mg total) by mouth daily., Disp: 30 tablet, Rfl: 0   amphetamine -dextroamphetamine  (ADDERALL) 5 MG tablet, Take 1 tablet (5 mg total) by mouth daily., Disp: 30 tablet, Rfl: 0   amphetamine -dextroamphetamine   (ADDERALL) 5 MG tablet, Take 1 tablet (5 mg total) by mouth daily., Disp: 30 tablet, Rfl: 0   Cholecalciferol  (VITAMIN D ) 50 MCG (2000 UT) CAPS, Take 1 capsule by mouth daily., Disp: , Rfl:    FLUoxetine  (PROZAC ) 40 MG capsule, Take 1 capsule (40 mg total) by mouth daily with breakfast., Disp: 90 capsule, Rfl: 0   fluticasone  (GNP FLUTICASONE  PROPIONATE) 50 MCG/ACT nasal spray, Place 1-2 sprays into both nostrils daily., Disp: 16 g, Rfl: 4   lisdexamfetamine (VYVANSE ) 70 MG capsule, Take 1 capsule (70 mg total) by mouth daily before breakfast., Disp: 30 capsule, Rfl: 0   lisdexamfetamine (VYVANSE ) 70 MG capsule, Take 1 capsule (70 mg total) by mouth daily before breakfast., Disp: 30 capsule, Rfl: 0   lisdexamfetamine (VYVANSE ) 70 MG capsule, Take 1 capsule (70 mg total) by mouth daily before breakfast., Disp: 30 capsule, Rfl: 0   losartan  (COZAAR ) 50 MG tablet, Take 1 tablet (50 mg total) by mouth daily., Disp: 90 tablet, Rfl: 3   metFORMIN  (GLUCOPHAGE -XR) 500 MG 24 hr tablet, Take 1 tablet (500 mg total) by mouth 2 (two) times daily with a meal., Disp: 180 tablet, Rfl: 1   omeprazole  (PRILOSEC) 20 MG capsule, Take 1 capsule (20 mg total) by mouth daily., Disp: 30 capsule, Rfl: 0   pravastatin  (PRAVACHOL ) 20 MG tablet, Take 1 tablet (20 mg total) by  mouth daily., Disp: 90 tablet, Rfl: 3   triamterene -hydrochlorothiazide  (MAXZIDE -25) 37.5-25 MG tablet, Take 1 tablet by mouth daily., Disp: 90 tablet, Rfl: 3   ciclopirox  (PENLAC ) 8 % solution, Apply topically at bedtime. Apply over nail and surrounding skin. Apply daily over previous coat. After seven (7) days, may remove with alcohol and continue cycle. (Patient not taking: Reported on 03/20/2024), Disp: 6.6 mL, Rfl: 0   rizatriptan  (MAXALT ) 10 MG tablet, Take 1 tablet (10 mg total) by mouth as needed for migraine. May repeat in 2 hours if needed, Disp: 10 tablet, Rfl: 2   SYRINGE-NEEDLE, DISP, 3 ML (B-D 3CC LUER-LOK SYR 25GX1) 25G X 1 3 ML MISC, USE TO  INJECT VIT B 12., Disp: 6 each, Rfl: 0  Allergies  Allergen Reactions   Penicillins Anaphylaxis, Shortness Of Breath and Other (See Comments)    Tachycardia  Has patient had a PCN reaction causing immediate rash, facial/tongue/throat swelling, SOB or lightheadedness with hypotension: Yes Has patient had a PCN reaction causing severe rash involving mucus membranes or skin necrosis: No Has patient had a PCN reaction that required hospitalization: No Has patient had a PCN reaction occurring within the last 10 years: Yes If all of the above answers are NO, then may proceed with Cephalosporin use.    Bupropion Rash    Blister   Ciprodex  [Ciprofloxacin -Dexamethasone ] Other (See Comments)    Swelling of ear with treating EO   Ozempic  (0.25 Or 0.5 Mg-Dose) [Semaglutide (0.25 Or 0.5mg -Dos)] Nausea Only and Other (See Comments)    Objective:   BP 102/84 (BP Location: Left Arm, Patient Position: Sitting, Cuff Size: Large)   Pulse 70   Temp 98.9 F (37.2 C) (Temporal)   Ht 5' 2 (1.575 m)   Wt 170 lb (77.1 kg)   LMP 05/07/2018 (Exact Date)   SpO2 97%   BMI 31.09 kg/m    Physical Exam Constitutional:      Appearance: Normal appearance. She is ill-appearing.  Cardiovascular:     Rate and Rhythm: Normal rate and regular rhythm.  Pulmonary:     Effort: Pulmonary effort is normal.  Abdominal:     General: Abdomen is flat. Bowel sounds are decreased.     Palpations: Abdomen is soft.     Tenderness: There is abdominal tenderness in the right upper quadrant and right lower quadrant. There is no guarding or rebound. Positive signs include Murphy's sign. Negative signs include Rovsing's sign, McBurney's sign and psoas sign.     Comments: Rlq very mild    Neurological:     Mental Status: She is alert.     Assessment & Plan:  Controlled type 2 diabetes mellitus without complication, without long-term current use of insulin (HCC)  Bilious vomiting with nausea Assessment & Plan: Zofran   phenergan  prn  Eat as tolerated Bland diet.   Worry for dehydration    Orders: -     H. pylori breath test -     CBC with Differential/Platelet -     Comprehensive metabolic panel with GFR  Gastroesophageal reflux disease without esophagitis -     Omeprazole ; Take 1 capsule (20 mg total) by mouth daily.  Dispense: 30 capsule; Refill: 0  Migraine without aura and without status migrainosus, not intractable -     Rizatriptan  Benzoate; Take 1 tablet (10 mg total) by mouth as needed for migraine. May repeat in 2 hours if needed  Dispense: 10 tablet; Refill: 2  Right upper quadrant abdominal pain  Positive Murphy Sign Assessment &  Plan: Cbc cmp amylase lipase  Pending results.  H pylori at a later date, when vomiting has dissipated and if still with sx Stat u/s ruq r/o cholecystitis visualize liver and gallbladder   Advised pt of ER precautions Dehydration risk, push fluids as tolerated.  Overall vitals stable.   If u/s negative consider HIDA  Stop trulicity  as suspected culprit  Orders: -     US  ABDOMEN LIMITED RUQ (LIVER/GB); Future  Upper abdominal pain -     Amylase -     Lipase    Follow up plan: Return if symptoms worsen or fail to improve.  Ginger Patrick, FNP

## 2024-03-20 NOTE — Assessment & Plan Note (Addendum)
 Zofran  phenergan  prn  Eat as tolerated Bland diet.   Worry for dehydration

## 2024-03-20 NOTE — Addendum Note (Signed)
 Addended by: HOPE VEVA PARAS on: 03/20/2024 11:27 AM   Modules accepted: Orders

## 2024-03-23 ENCOUNTER — Other Ambulatory Visit

## 2024-03-23 ENCOUNTER — Other Ambulatory Visit: Payer: Self-pay | Admitting: Family

## 2024-03-23 DIAGNOSIS — R944 Abnormal results of kidney function studies: Secondary | ICD-10-CM | POA: Diagnosis not present

## 2024-03-23 NOTE — Addendum Note (Signed)
 Addended by: KALLIE CLOTILDA SQUIBB on: 03/23/2024 12:07 PM   Modules accepted: Orders

## 2024-03-23 NOTE — Progress Notes (Signed)
 A dip in kidney function I'd like a urine sample, advised pt she will bring into office.  R/o UTI however most likely r/t dehydration in setting of vomiting and not being able to keep food/liquids down. Otherwise labs do not show acute worry for infection, wbc normal

## 2024-03-24 ENCOUNTER — Encounter: Payer: Self-pay | Admitting: Physician Assistant

## 2024-03-24 ENCOUNTER — Ambulatory Visit: Admitting: Physician Assistant

## 2024-03-24 ENCOUNTER — Ambulatory Visit: Payer: Self-pay | Admitting: Family

## 2024-03-24 DIAGNOSIS — L821 Other seborrheic keratosis: Secondary | ICD-10-CM

## 2024-03-24 DIAGNOSIS — D1801 Hemangioma of skin and subcutaneous tissue: Secondary | ICD-10-CM

## 2024-03-24 DIAGNOSIS — L814 Other melanin hyperpigmentation: Secondary | ICD-10-CM | POA: Diagnosis not present

## 2024-03-24 DIAGNOSIS — W908XXA Exposure to other nonionizing radiation, initial encounter: Secondary | ICD-10-CM

## 2024-03-24 DIAGNOSIS — L578 Other skin changes due to chronic exposure to nonionizing radiation: Secondary | ICD-10-CM | POA: Diagnosis not present

## 2024-03-24 DIAGNOSIS — D229 Melanocytic nevi, unspecified: Secondary | ICD-10-CM

## 2024-03-24 DIAGNOSIS — Z1283 Encounter for screening for malignant neoplasm of skin: Secondary | ICD-10-CM | POA: Diagnosis not present

## 2024-03-24 LAB — URINALYSIS W MICROSCOPIC + REFLEX CULTURE
Bilirubin Urine: NEGATIVE
Glucose, UA: NEGATIVE
Hgb urine dipstick: NEGATIVE
Hyaline Cast: NONE SEEN /LPF
Ketones, ur: NEGATIVE
Leukocyte Esterase: NEGATIVE
Nitrites, Initial: NEGATIVE
Protein, ur: NEGATIVE
RBC / HPF: NONE SEEN /HPF (ref 0–2)
Specific Gravity, Urine: 1.012 (ref 1.001–1.035)
pH: 7 (ref 5.0–8.0)

## 2024-03-24 LAB — NO CULTURE INDICATED

## 2024-03-24 NOTE — Progress Notes (Signed)
   Follow-Up Visit   Subjective  Haley Mcdonald is a 43 y.o. female who presents for the following: Skin Cancer Screening and Full Body Skin Exam  Area of concern is on top of the head in the scalp. She says it is itchy. Grandparents have hx of skin cancer but not sure of the dx. No personal hx of skin cancer or abnormal moles  The patient presents for Total-Body Skin Exam (TBSE) for skin cancer screening and mole check. The patient has spots, moles and lesions to be evaluated, some may be new or changing and the patient may have concern these could be cancer.    The following portions of the chart were reviewed this encounter and updated as appropriate: medications, allergies, medical history  Review of Systems:  No other skin or systemic complaints except as noted in HPI or Assessment and Plan.  Objective  Well appearing patient in no apparent distress; mood and affect are within normal limits.  A full examination was performed including scalp, head, eyes, ears, nose, lips, neck, chest, axillae, abdomen, back, buttocks, bilateral upper extremities, bilateral lower extremities, hands, feet, fingers, toes, fingernails, and toenails. All findings within normal limits unless otherwise noted below.   Relevant physical exam findings are noted in the Assessment and Plan.    Assessment & Plan   SKIN CANCER SCREENING PERFORMED TODAY.  ACTINIC DAMAGE - Chronic condition, secondary to cumulative UV/sun exposure - diffuse scaly erythematous macules with underlying dyspigmentation - Recommend daily broad spectrum sunscreen SPF 30+ to sun-exposed areas, reapply every 2 hours as needed.  - Staying in the shade or wearing long sleeves, sun glasses (UVA+UVB protection) and wide brim hats (4-inch brim around the entire circumference of the hat) are also recommended for sun protection.  - Call for new or changing lesions.  LENTIGINES, SEBORRHEIC KERATOSES, HEMANGIOMAS - Benign normal skin  lesions - Benign-appearing - Call for any changes  MELANOCYTIC NEVI - Tan-brown and/or pink-flesh-colored symmetric macules and papules - Benign appearing on exam today - Observation - Call clinic for new or changing moles - Recommend daily use of broad spectrum spf 30+ sunscreen to sun-exposed areas.       SCREENING EXAM FOR SKIN CANCER   ACTINIC SKIN DAMAGE   LENTIGINES   SEBORRHEIC KERATOSIS   CHERRY ANGIOMA   MULTIPLE BENIGN NEVI   Return in about 1 year (around 03/24/2025) for TBSE.  I, Gordan Beams, CMA, am acting as scribe for Evynn Boutelle K, PA-C.   Documentation: I have reviewed the above documentation for accuracy and completeness, and I agree with the above.  Jerimah Witucki K, PA-C

## 2024-03-25 DIAGNOSIS — F411 Generalized anxiety disorder: Secondary | ICD-10-CM | POA: Diagnosis not present

## 2024-04-06 DIAGNOSIS — F411 Generalized anxiety disorder: Secondary | ICD-10-CM | POA: Diagnosis not present

## 2024-04-14 ENCOUNTER — Other Ambulatory Visit: Payer: Self-pay | Admitting: Family

## 2024-04-14 DIAGNOSIS — F988 Other specified behavioral and emotional disorders with onset usually occurring in childhood and adolescence: Secondary | ICD-10-CM

## 2024-04-14 DIAGNOSIS — F411 Generalized anxiety disorder: Secondary | ICD-10-CM | POA: Diagnosis not present

## 2024-04-15 ENCOUNTER — Other Ambulatory Visit: Payer: Self-pay

## 2024-04-15 ENCOUNTER — Other Ambulatory Visit: Payer: Self-pay | Admitting: Family

## 2024-04-15 DIAGNOSIS — F988 Other specified behavioral and emotional disorders with onset usually occurring in childhood and adolescence: Secondary | ICD-10-CM

## 2024-04-15 MED ORDER — LISDEXAMFETAMINE DIMESYLATE 70 MG PO CAPS
70.0000 mg | ORAL_CAPSULE | Freq: Every day | ORAL | 0 refills | Status: AC
  Filled 2024-04-15 – 2024-05-20 (×2): qty 30, 30d supply, fill #0

## 2024-04-15 MED ORDER — LISDEXAMFETAMINE DIMESYLATE 70 MG PO CAPS: 70.0000 mg | ORAL_CAPSULE | Freq: Every day | ORAL | 0 refills | Status: AC

## 2024-04-15 MED ORDER — LISDEXAMFETAMINE DIMESYLATE 70 MG PO CAPS
70.0000 mg | ORAL_CAPSULE | Freq: Every day | ORAL | 0 refills | Status: AC
  Filled 2024-06-29: qty 30, 30d supply, fill #0

## 2024-04-29 ENCOUNTER — Other Ambulatory Visit: Payer: Self-pay

## 2024-04-29 DIAGNOSIS — F331 Major depressive disorder, recurrent, moderate: Secondary | ICD-10-CM | POA: Diagnosis not present

## 2024-04-29 DIAGNOSIS — F5105 Insomnia due to other mental disorder: Secondary | ICD-10-CM | POA: Diagnosis not present

## 2024-04-29 DIAGNOSIS — F411 Generalized anxiety disorder: Secondary | ICD-10-CM | POA: Diagnosis not present

## 2024-04-29 MED ORDER — FLUOXETINE HCL 40 MG PO CAPS
40.0000 mg | ORAL_CAPSULE | Freq: Every day | ORAL | 0 refills | Status: DC
  Filled 2024-04-29 – 2024-05-20 (×2): qty 90, 90d supply, fill #0

## 2024-04-30 DIAGNOSIS — F411 Generalized anxiety disorder: Secondary | ICD-10-CM | POA: Diagnosis not present

## 2024-05-11 ENCOUNTER — Other Ambulatory Visit: Payer: Self-pay

## 2024-05-13 DIAGNOSIS — F411 Generalized anxiety disorder: Secondary | ICD-10-CM | POA: Diagnosis not present

## 2024-05-20 ENCOUNTER — Other Ambulatory Visit: Payer: Self-pay

## 2024-05-20 ENCOUNTER — Other Ambulatory Visit: Payer: Self-pay | Admitting: Family

## 2024-05-20 DIAGNOSIS — K219 Gastro-esophageal reflux disease without esophagitis: Secondary | ICD-10-CM

## 2024-05-20 DIAGNOSIS — E119 Type 2 diabetes mellitus without complications: Secondary | ICD-10-CM

## 2024-05-20 MED ORDER — METFORMIN HCL ER 500 MG PO TB24
500.0000 mg | ORAL_TABLET | Freq: Two times a day (BID) | ORAL | 1 refills | Status: AC
  Filled 2024-05-20: qty 180, 90d supply, fill #0

## 2024-05-20 MED ORDER — OMEPRAZOLE 20 MG PO CPDR
20.0000 mg | DELAYED_RELEASE_CAPSULE | Freq: Every day | ORAL | 1 refills | Status: AC
  Filled 2024-05-20: qty 90, 90d supply, fill #0

## 2024-05-28 DIAGNOSIS — F411 Generalized anxiety disorder: Secondary | ICD-10-CM | POA: Diagnosis not present

## 2024-06-18 ENCOUNTER — Other Ambulatory Visit: Payer: Self-pay | Admitting: Family

## 2024-06-18 DIAGNOSIS — F411 Generalized anxiety disorder: Secondary | ICD-10-CM | POA: Diagnosis not present

## 2024-06-18 DIAGNOSIS — Z1231 Encounter for screening mammogram for malignant neoplasm of breast: Secondary | ICD-10-CM

## 2024-06-22 ENCOUNTER — Ambulatory Visit
Admission: RE | Admit: 2024-06-22 | Discharge: 2024-06-22 | Disposition: A | Payer: 59 | Source: Ambulatory Visit | Attending: Family | Admitting: Family

## 2024-06-22 DIAGNOSIS — Z1231 Encounter for screening mammogram for malignant neoplasm of breast: Secondary | ICD-10-CM

## 2024-06-25 ENCOUNTER — Ambulatory Visit: Payer: Self-pay | Admitting: Family

## 2024-06-29 ENCOUNTER — Other Ambulatory Visit: Payer: Self-pay

## 2024-06-29 DIAGNOSIS — F411 Generalized anxiety disorder: Secondary | ICD-10-CM | POA: Diagnosis not present

## 2024-07-20 DIAGNOSIS — F411 Generalized anxiety disorder: Secondary | ICD-10-CM | POA: Diagnosis not present

## 2024-07-27 ENCOUNTER — Other Ambulatory Visit: Payer: Self-pay

## 2024-07-27 DIAGNOSIS — F5105 Insomnia due to other mental disorder: Secondary | ICD-10-CM | POA: Diagnosis not present

## 2024-07-27 DIAGNOSIS — F331 Major depressive disorder, recurrent, moderate: Secondary | ICD-10-CM | POA: Diagnosis not present

## 2024-07-27 DIAGNOSIS — F411 Generalized anxiety disorder: Secondary | ICD-10-CM | POA: Diagnosis not present

## 2024-07-27 MED ORDER — FLUOXETINE HCL 40 MG PO CAPS
40.0000 mg | ORAL_CAPSULE | Freq: Every day | ORAL | 1 refills | Status: AC
  Filled 2024-07-27: qty 90, 90d supply, fill #0

## 2024-08-05 DIAGNOSIS — F411 Generalized anxiety disorder: Secondary | ICD-10-CM | POA: Diagnosis not present

## 2024-08-07 ENCOUNTER — Other Ambulatory Visit: Payer: Self-pay | Admitting: *Deleted

## 2024-08-07 ENCOUNTER — Other Ambulatory Visit: Payer: Self-pay

## 2024-08-07 ENCOUNTER — Encounter: Payer: Self-pay | Admitting: Family

## 2024-08-07 DIAGNOSIS — F988 Other specified behavioral and emotional disorders with onset usually occurring in childhood and adolescence: Secondary | ICD-10-CM

## 2024-08-07 MED ORDER — LISDEXAMFETAMINE DIMESYLATE 70 MG PO CAPS
70.0000 mg | ORAL_CAPSULE | Freq: Every day | ORAL | 0 refills | Status: DC
  Filled 2024-08-07: qty 30, 30d supply, fill #0

## 2024-08-24 ENCOUNTER — Ambulatory Visit: Payer: Self-pay | Admitting: Family

## 2024-08-24 ENCOUNTER — Ambulatory Visit: Admitting: Family

## 2024-08-24 ENCOUNTER — Encounter: Payer: Self-pay | Admitting: Family

## 2024-08-24 ENCOUNTER — Other Ambulatory Visit: Payer: Self-pay

## 2024-08-24 VITALS — BP 136/94 | HR 77 | Temp 98.0°F | Wt 196.0 lb

## 2024-08-24 DIAGNOSIS — E559 Vitamin D deficiency, unspecified: Secondary | ICD-10-CM | POA: Diagnosis not present

## 2024-08-24 DIAGNOSIS — E538 Deficiency of other specified B group vitamins: Secondary | ICD-10-CM

## 2024-08-24 DIAGNOSIS — I10 Essential (primary) hypertension: Secondary | ICD-10-CM

## 2024-08-24 DIAGNOSIS — E782 Mixed hyperlipidemia: Secondary | ICD-10-CM | POA: Diagnosis not present

## 2024-08-24 DIAGNOSIS — E119 Type 2 diabetes mellitus without complications: Secondary | ICD-10-CM

## 2024-08-24 DIAGNOSIS — Z7984 Long term (current) use of oral hypoglycemic drugs: Secondary | ICD-10-CM | POA: Diagnosis not present

## 2024-08-24 LAB — BASIC METABOLIC PANEL WITH GFR
BUN: 14 mg/dL (ref 6–23)
CO2: 27 meq/L (ref 19–32)
Calcium: 9.1 mg/dL (ref 8.4–10.5)
Chloride: 101 meq/L (ref 96–112)
Creatinine, Ser: 0.74 mg/dL (ref 0.40–1.20)
GFR: 99.14 mL/min
Glucose, Bld: 206 mg/dL — ABNORMAL HIGH (ref 70–99)
Potassium: 4.3 meq/L (ref 3.5–5.1)
Sodium: 136 meq/L (ref 135–145)

## 2024-08-24 LAB — TSH: TSH: 3.83 u[IU]/mL (ref 0.35–5.50)

## 2024-08-24 LAB — CBC
HCT: 43.6 % (ref 36.0–46.0)
Hemoglobin: 14.6 g/dL (ref 12.0–15.0)
MCHC: 33.4 g/dL (ref 30.0–36.0)
MCV: 86.6 fl (ref 78.0–100.0)
Platelets: 242 K/uL (ref 150.0–400.0)
RBC: 5.04 Mil/uL (ref 3.87–5.11)
RDW: 13.3 % (ref 11.5–15.5)
WBC: 9.4 K/uL (ref 4.0–10.5)

## 2024-08-24 LAB — LIPID PANEL
Cholesterol: 228 mg/dL — ABNORMAL HIGH (ref 28–200)
HDL: 40.2 mg/dL
LDL Cholesterol: 128 mg/dL — ABNORMAL HIGH (ref 10–99)
NonHDL: 187.6
Total CHOL/HDL Ratio: 6
Triglycerides: 297 mg/dL — ABNORMAL HIGH (ref 10.0–149.0)
VLDL: 59.4 mg/dL — ABNORMAL HIGH (ref 0.0–40.0)

## 2024-08-24 LAB — HEMOGLOBIN A1C: Hgb A1c MFr Bld: 7.6 % — ABNORMAL HIGH (ref 4.6–6.5)

## 2024-08-24 LAB — VITAMIN D 25 HYDROXY (VIT D DEFICIENCY, FRACTURES): VITD: 18.14 ng/mL — ABNORMAL LOW (ref 30.00–100.00)

## 2024-08-24 MED ORDER — LANCET DEVICE MISC
1.0000 | Freq: Three times a day (TID) | 0 refills | Status: AC
  Filled 2024-08-24: qty 1, 30d supply, fill #0

## 2024-08-24 MED ORDER — CHOLECALCIFEROL 125 MCG (5000 UT) PO CAPS
5000.0000 [IU] | ORAL_CAPSULE | ORAL | 0 refills | Status: AC
  Filled 2024-08-24 – 2024-08-27 (×3): qty 12, 84d supply, fill #0

## 2024-08-24 MED ORDER — LANCETS MISC
1.0000 | 0 refills | Status: AC
  Filled 2024-08-24: qty 100, 25d supply, fill #0

## 2024-08-24 MED ORDER — BLOOD GLUCOSE TEST VI STRP
1.0000 | ORAL_STRIP | Freq: Three times a day (TID) | 0 refills | Status: AC
  Filled 2024-08-24: qty 100, 34d supply, fill #0

## 2024-08-24 MED ORDER — BLOOD GLUCOSE MONITOR SYSTEM W/DEVICE KIT
1.0000 | PACK | Freq: Three times a day (TID) | 0 refills | Status: AC
  Filled 2024-08-24: qty 1, 1d supply, fill #0

## 2024-08-24 NOTE — Progress Notes (Signed)
 "  Established Patient Office Visit  Subjective:      CC:  Chief Complaint  Patient presents with   Diabetes    3 month follow up    HPI: Haley Mcdonald is a 44 y.o. female presenting on 08/24/2024 for Diabetes (3 month follow up) .  Discussed the use of AI scribe software for clinical note transcription with the patient, who gave verbal consent to proceed.  History of Present Illness Haley Mcdonald is a 44 year old female with hypertension and diabetes who presents with anxiety and medication management concerns.  She has discontinued all medications except for Prozac  and Vyvanse  a couple of months ago due to experiencing jitteriness, lightheadedness, and extreme fatigue. Her anxiety has been severe since Friday.  She has a history of diabetes and has stopped taking metformin . She acknowledges the importance of managing her diabetes but has been inconsistent with her medication due to forgetfulness and feeling unwell.  She is currently taking Vyvanse  and Prozac , with a recent increase in her Prozac  dosage. She takes magnesium to aid sleep, which is effective when she remembers to take it.  She is undergoing trauma therapy and working on a timeline of life events, which has been emotionally challenging. She experiences difficulty sleeping due to anxiety related to past trauma.   She has made dietary changes to improve her cholesterol levels, such as using olive oil instead of butter          Social history:  Relevant past medical, surgical, family and social history reviewed and updated as indicated. Interim medical history since our last visit reviewed.  Allergies and medications reviewed and updated.  DATA REVIEWED: CHART IN EPIC     ROS: Negative unless specifically indicated above in HPI.   Current Medications[1]        Objective:        BP (!) 136/94 (BP Location: Left Arm, Patient Position: Sitting, Cuff Size: Large)   Pulse 77    Temp 98 F (36.7 C) (Temporal)   Wt 196 lb (88.9 kg)   LMP 05/07/2018   SpO2 97%   BMI 35.85 kg/m   Physical Exam VITALS: BP- 146/100  Wt Readings from Last 3 Encounters:  08/24/24 196 lb (88.9 kg)  03/20/24 170 lb (77.1 kg)  01/14/24 184 lb 9.6 oz (83.7 kg)    Physical Exam Vitals reviewed.  Constitutional:      General: She is not in acute distress.    Appearance: Normal appearance. She is normal weight. She is not ill-appearing, toxic-appearing or diaphoretic.  HENT:     Head: Normocephalic.  Cardiovascular:     Rate and Rhythm: Normal rate.  Pulmonary:     Effort: Pulmonary effort is normal.  Musculoskeletal:        General: Normal range of motion.  Neurological:     General: No focal deficit present.     Mental Status: She is alert and oriented to person, place, and time. Mental status is at baseline.  Psychiatric:        Mood and Affect: Mood normal. Affect is tearful.        Behavior: Behavior normal.        Thought Content: Thought content normal. Thought content does not include homicidal or suicidal ideation. Thought content does not include homicidal or suicidal plan.        Judgment: Judgment normal.          Results Labs HDL: 36 Total cholesterol: 185  Assessment & Plan:   Assessment and Plan Assessment & Plan Essential hypertension Blood pressure is elevated at 146/100 mmHg. Previously on losartan  and triamterene , but discontinued due to side effects including jitteriness, lightheadedness, and fatigue. Losartan  is important for kidney protection. - Restarted losartan  - Discontinued triamterene   Type 2 diabetes mellitus Diabetes management is suboptimal due to discontinuation of metformin . Importance of medication adherence emphasized to prevent complications. - Restart metformin   Mixed hyperlipidemia Cholesterol levels are well-managed with HDL at 36 mg/dL and total cholesterol at 185 mg/dL. Moderate intensity statin recommended due to  diabetes and hypertension, but lifestyle modifications are prioritized. Ten-year atherosclerotic cardiovascular disease risk is 3.8%, which is favorable. - Encouraged dietary modifications to increase HDL, such as consuming avocados, nuts, seeds, and olive oil - Will consider restarting statin therapy if lifestyle modifications are insufficient  Major depressive disorder, recurrent Anxiety is severe, rated 10/10, exacerbated by recent life events and trauma therapy. Currently on Prozac  and Vyvanse . Therapy with Dr. Kapoor is ongoing, focusing on trauma and anxiety management. Magnesium supplementation was ineffective for sleep. - Continue Prozac  and Vyvanse  - Continue therapy with therapist as well as with psychiatry  - Encouraged self-compassion and radical acceptance techniques     Return in about 3 months (around 11/22/2024) for f/u ADD medication.     Ginger Patrick, MSN, APRN, FNP-C [facility]        [1]  Current Outpatient Medications:    albuterol  (VENTOLIN  HFA) 108 (90 Base) MCG/ACT inhaler, Inhale 2 puffs into the lungs every 6 (six) hours as needed for wheezing or shortness of breath., Disp: 6.7 g, Rfl: 3   Cholecalciferol  (VITAMIN D ) 50 MCG (2000 UT) CAPS, Take 1 capsule by mouth daily., Disp: , Rfl:    FLUoxetine  (PROZAC ) 40 MG capsule, Take 1 capsule (40 mg total) by mouth daily with breakfast., Disp: 90 capsule, Rfl: 1   fluticasone  (GNP FLUTICASONE  PROPIONATE) 50 MCG/ACT nasal spray, Place 1-2 sprays into both nostrils daily., Disp: 16 g, Rfl: 4   lisdexamfetamine  (VYVANSE ) 70 MG capsule, Take 1 capsule (70 mg total) by mouth daily before breakfast., Disp: 30 capsule, Rfl: 0   losartan  (COZAAR ) 50 MG tablet, Take 1 tablet (50 mg total) by mouth daily., Disp: 90 tablet, Rfl: 3   metFORMIN  (GLUCOPHAGE -XR) 500 MG 24 hr tablet, Take 1 tablet (500 mg total) by mouth 2 (two) times daily with a meal., Disp: 180 tablet, Rfl: 1   omeprazole  (PRILOSEC)  20 MG capsule, Take 1 capsule (20 mg total) by mouth daily., Disp: 90 capsule, Rfl: 1   pravastatin  (PRAVACHOL ) 20 MG tablet, Take 1 tablet (20 mg total) by mouth daily., Disp: 90 tablet, Rfl: 3   rizatriptan  (MAXALT ) 10 MG tablet, Take 1 tablet (10 mg total) by mouth as needed for migraine. May repeat in 2 hours if needed, Disp: 10 tablet, Rfl: 2   SYRINGE-NEEDLE, DISP, 3 ML (B-D 3CC LUER-LOK SYR 25GX1) 25G X 1 3 ML MISC, USE TO INJECT VIT B 12., Disp: 6 each, Rfl: 0  "

## 2024-08-25 ENCOUNTER — Other Ambulatory Visit: Payer: Self-pay

## 2024-08-27 ENCOUNTER — Other Ambulatory Visit (HOSPITAL_COMMUNITY): Payer: Self-pay

## 2024-08-27 ENCOUNTER — Other Ambulatory Visit: Payer: Self-pay

## 2024-08-28 ENCOUNTER — Other Ambulatory Visit: Payer: Self-pay

## 2024-09-11 ENCOUNTER — Other Ambulatory Visit: Payer: Self-pay | Admitting: *Deleted

## 2024-09-11 DIAGNOSIS — F988 Other specified behavioral and emotional disorders with onset usually occurring in childhood and adolescence: Secondary | ICD-10-CM

## 2024-09-14 ENCOUNTER — Other Ambulatory Visit: Payer: Self-pay

## 2024-09-14 MED ORDER — LISDEXAMFETAMINE DIMESYLATE 70 MG PO CAPS
70.0000 mg | ORAL_CAPSULE | Freq: Every day | ORAL | 0 refills | Status: AC
  Filled 2024-09-14: qty 30, 30d supply, fill #0

## 2024-09-14 MED ORDER — LISDEXAMFETAMINE DIMESYLATE 70 MG PO CAPS: 70.0000 mg | ORAL_CAPSULE | Freq: Every day | ORAL | 0 refills | Status: AC

## 2025-03-29 ENCOUNTER — Ambulatory Visit: Admitting: Physician Assistant
